# Patient Record
Sex: Female | Born: 1935
Health system: Southern US, Community
[De-identification: ages and names within clinical notes are randomized; demographics above are authoritative.]

## PROBLEM LIST (undated history)

## (undated) DIAGNOSIS — K589 Irritable bowel syndrome without diarrhea: Secondary | ICD-10-CM

## (undated) DIAGNOSIS — D126 Benign neoplasm of colon, unspecified: Secondary | ICD-10-CM

## (undated) DIAGNOSIS — F32A Depression, unspecified: Secondary | ICD-10-CM

## (undated) DIAGNOSIS — K219 Gastro-esophageal reflux disease without esophagitis: Secondary | ICD-10-CM

## (undated) DIAGNOSIS — D649 Anemia, unspecified: Secondary | ICD-10-CM

## (undated) DIAGNOSIS — F419 Anxiety disorder, unspecified: Secondary | ICD-10-CM

## (undated) HISTORY — DX: Depression, unspecified: F32.A

## (undated) HISTORY — PX: EYE SURGERY: SHX253

## (undated) HISTORY — DX: Irritable bowel syndrome, unspecified: K58.9

## (undated) HISTORY — DX: Gastro-esophageal reflux disease without esophagitis: K21.9

## (undated) HISTORY — DX: Anxiety disorder, unspecified: F41.9

## (undated) HISTORY — PX: CATARACT EXTRACTION: SUR2

## (undated) HISTORY — DX: Benign neoplasm of colon, unspecified: D12.6

---

## 2001-05-27 ENCOUNTER — Other Ambulatory Visit: Admission: RE | Admit: 2001-05-27 | Discharge: 2001-05-27 | Payer: Self-pay | Admitting: Family Medicine

## 2001-06-11 ENCOUNTER — Ambulatory Visit (HOSPITAL_COMMUNITY): Admission: RE | Admit: 2001-06-11 | Discharge: 2001-06-11 | Payer: Self-pay | Admitting: Family Medicine

## 2001-06-11 ENCOUNTER — Encounter: Payer: Self-pay | Admitting: Family Medicine

## 2002-06-17 ENCOUNTER — Encounter: Payer: Self-pay | Admitting: Family Medicine

## 2002-06-17 ENCOUNTER — Ambulatory Visit (HOSPITAL_COMMUNITY): Admission: RE | Admit: 2002-06-17 | Discharge: 2002-06-17 | Payer: Self-pay | Admitting: Family Medicine

## 2002-07-30 ENCOUNTER — Other Ambulatory Visit: Admission: RE | Admit: 2002-07-30 | Discharge: 2002-07-30 | Payer: Self-pay | Admitting: Obstetrics & Gynecology

## 2002-12-16 ENCOUNTER — Other Ambulatory Visit: Admission: RE | Admit: 2002-12-16 | Discharge: 2002-12-16 | Payer: Self-pay | Admitting: Obstetrics & Gynecology

## 2003-07-06 ENCOUNTER — Ambulatory Visit (HOSPITAL_COMMUNITY): Admission: RE | Admit: 2003-07-06 | Discharge: 2003-07-06 | Payer: Self-pay | Admitting: Family Medicine

## 2003-08-25 ENCOUNTER — Ambulatory Visit (HOSPITAL_COMMUNITY): Admission: RE | Admit: 2003-08-25 | Discharge: 2003-08-25 | Payer: Self-pay | Admitting: Internal Medicine

## 2003-10-05 ENCOUNTER — Ambulatory Visit (HOSPITAL_COMMUNITY): Admission: RE | Admit: 2003-10-05 | Discharge: 2003-10-05 | Payer: Self-pay | Admitting: Family Medicine

## 2003-12-29 ENCOUNTER — Ambulatory Visit (HOSPITAL_COMMUNITY): Admission: RE | Admit: 2003-12-29 | Discharge: 2003-12-29 | Payer: Self-pay | Admitting: Obstetrics & Gynecology

## 2003-12-29 ENCOUNTER — Encounter (INDEPENDENT_AMBULATORY_CARE_PROVIDER_SITE_OTHER): Payer: Self-pay | Admitting: Specialist

## 2004-08-04 ENCOUNTER — Ambulatory Visit (HOSPITAL_COMMUNITY): Admission: RE | Admit: 2004-08-04 | Discharge: 2004-08-04 | Payer: Self-pay | Admitting: Obstetrics & Gynecology

## 2004-08-16 ENCOUNTER — Encounter: Admission: RE | Admit: 2004-08-16 | Discharge: 2004-08-16 | Payer: Self-pay | Admitting: Obstetrics & Gynecology

## 2005-02-06 ENCOUNTER — Other Ambulatory Visit: Admission: RE | Admit: 2005-02-06 | Discharge: 2005-02-06 | Payer: Self-pay | Admitting: Obstetrics & Gynecology

## 2005-02-28 ENCOUNTER — Encounter: Admission: RE | Admit: 2005-02-28 | Discharge: 2005-02-28 | Payer: Self-pay | Admitting: Obstetrics & Gynecology

## 2005-08-07 ENCOUNTER — Encounter: Admission: RE | Admit: 2005-08-07 | Discharge: 2005-08-07 | Payer: Self-pay | Admitting: Obstetrics & Gynecology

## 2006-06-07 ENCOUNTER — Ambulatory Visit (HOSPITAL_COMMUNITY): Admission: RE | Admit: 2006-06-07 | Discharge: 2006-06-07 | Payer: Self-pay | Admitting: Family Medicine

## 2006-08-10 ENCOUNTER — Encounter: Admission: RE | Admit: 2006-08-10 | Discharge: 2006-08-10 | Payer: Self-pay | Admitting: Obstetrics & Gynecology

## 2007-08-23 ENCOUNTER — Encounter: Admission: RE | Admit: 2007-08-23 | Discharge: 2007-08-23 | Payer: Self-pay | Admitting: Obstetrics & Gynecology

## 2008-08-24 ENCOUNTER — Encounter: Admission: RE | Admit: 2008-08-24 | Discharge: 2008-08-24 | Payer: Self-pay | Admitting: Obstetrics & Gynecology

## 2008-11-11 ENCOUNTER — Ambulatory Visit (HOSPITAL_COMMUNITY): Admission: RE | Admit: 2008-11-11 | Discharge: 2008-11-11 | Payer: Self-pay | Admitting: Family Medicine

## 2009-08-16 ENCOUNTER — Encounter: Admission: RE | Admit: 2009-08-16 | Discharge: 2009-08-16 | Payer: Self-pay | Admitting: Obstetrics & Gynecology

## 2009-12-23 ENCOUNTER — Ambulatory Visit: Payer: Self-pay | Admitting: Otolaryngology

## 2010-01-27 ENCOUNTER — Ambulatory Visit: Payer: Self-pay | Admitting: Otolaryngology

## 2010-06-19 ENCOUNTER — Encounter: Payer: Self-pay | Admitting: Obstetrics & Gynecology

## 2010-06-27 ENCOUNTER — Other Ambulatory Visit (HOSPITAL_COMMUNITY): Payer: Self-pay | Admitting: Family Medicine

## 2010-07-28 ENCOUNTER — Ambulatory Visit (INDEPENDENT_AMBULATORY_CARE_PROVIDER_SITE_OTHER): Payer: Medicare Other | Admitting: Otolaryngology

## 2010-07-28 DIAGNOSIS — H60399 Other infective otitis externa, unspecified ear: Secondary | ICD-10-CM

## 2010-07-28 DIAGNOSIS — R220 Localized swelling, mass and lump, head: Secondary | ICD-10-CM

## 2010-08-03 ENCOUNTER — Other Ambulatory Visit: Payer: Self-pay | Admitting: Obstetrics & Gynecology

## 2010-08-03 DIAGNOSIS — Z1231 Encounter for screening mammogram for malignant neoplasm of breast: Secondary | ICD-10-CM

## 2010-08-25 ENCOUNTER — Ambulatory Visit
Admission: RE | Admit: 2010-08-25 | Discharge: 2010-08-25 | Disposition: A | Discharge status: Home / Self Care | Payer: Medicare Other | Source: Ambulatory Visit | Attending: Obstetrics & Gynecology | Admitting: Obstetrics & Gynecology

## 2010-08-25 DIAGNOSIS — Z1231 Encounter for screening mammogram for malignant neoplasm of breast: Secondary | ICD-10-CM

## 2010-09-12 ENCOUNTER — Encounter (HOSPITAL_COMMUNITY): Payer: Self-pay

## 2010-09-12 ENCOUNTER — Ambulatory Visit (HOSPITAL_COMMUNITY)
Admission: RE | Admit: 2010-09-12 | Discharge: 2010-09-12 | Disposition: A | Discharge status: Home / Self Care | Payer: Medicare Other | Source: Ambulatory Visit | Attending: Family Medicine | Admitting: Family Medicine

## 2010-09-12 ENCOUNTER — Other Ambulatory Visit (HOSPITAL_COMMUNITY): Payer: Self-pay

## 2010-09-12 DIAGNOSIS — M899 Disorder of bone, unspecified: Secondary | ICD-10-CM | POA: Insufficient documentation

## 2010-10-14 NOTE — Op Note (Signed)
NAME:  Stephanie Wood, Stephanie Wood                            ACCOUNT NO.:  [id number]   MEDICAL RECORD NO.:  [id number]                   PATIENT TYPE:  AMB   LOCATION:  SDC                                  FACILITY:  [facility]   PHYSICIAN:  Ilda Mori, M.D.                DATE OF BIRTH:  1935/04/14   DATE OF PROCEDURE:  12/29/2003  DATE OF DISCHARGE:                                 OPERATIVE REPORT   PREOPERATIVE DIAGNOSIS:  Postmenopausal bleeding.   POSTOPERATIVE DIAGNOSIS:  Postmenopausal bleeding, atrophic endometrium.   OPERATION PERFORMED:  Hysteroscopy, dilation and curettage.   SURGEON:  Ilda Mori, M.D.   ANESTHESIA:  General.   ESTIMATED BLOOD LOSS:  5 mL.   FINDINGS:  Atrophic and normal-appearing endometrial cavity.   INDICATIONS FOR PROCEDURE:  The patient is a 76 year old female who has had  an approximately one year history of postmenopausal bleeding.  She was first  seen because of an AGUS Pap smear report.  At that point, a P[place]elle biopsy  showed no tissue and the ECC was negative and colposcopy was negative.  The  patient was followed, had another episode of bleeding for which a  sonohysterogram was performed in the office.  The sonohysterogram was  technically difficult and a definitive view of the endometrial cavity could  not be obtained.  Since no abnormalities were seen at that time and follow-  up Pap smear came back normal, decision was made not to pursue the  diagnosis.  Subsequently, the patient developed another episode of  postmenopausal bleeding and in view of the poor quality sonohysterogram with  the possibility of mass or lesion having been missed, and the previous  history of AGUS Pap smear, the decision was made to proceed with hospital  hysteroscopy and dilation and curettage.   DESCRIPTION OF PROCEDURE:  The patient was taken to the operating room and  general anesthesia was induced.  She was then placed in dorsal lithotomy  position.  The perineum  and vagina were prepped and draped in sterile  fashion.  The cervix was grasped with single toothed tenaculum. The  endometrial cavity was sounded to 7 cm.  An ECC was performed and no tissue  was found.  The hysteroscope was introduced and the cavity was perfectly  symmetrical.  Ostia were seen bilaterally. There were no lesions in the  endometrium and the endometrium  appeared to be markedly atrophic.  The hysteroscope was removed.  A dilation  and curettage was performed and absolutely no significant amount of tissue  was obtained at [facility], which was consistent with the hysteroscopic findings.  The procedure was then terminated and the patient left the operating room in  good condition.  Ilda Mori, M.D.    RK/MEDQ  D:  12/29/2003  T:  12/29/2003  Job:  [id number]   cc:   Mila Homer. Sudie Bailey, M.D.  [address] [place], [place] [address]  Fax: [phone]

## 2010-10-14 NOTE — Op Note (Signed)
NAME:  Stephanie Wood, Stephanie Wood                            ACCOUNT NO.:  [id number]   MEDICAL RECORD NO.:  [id number]                   PATIENT TYPE:  AMB   LOCATION:  DAY                                  FACILITY:  [facility]   PHYSICIAN:  Lionel December, M.D.                 DATE OF BIRTH:  Sep 03, 1934   DATE OF PROCEDURE:  08/25/2003  DATE OF DISCHARGE:                                 OPERATIVE REPORT   PROCEDURE:  Total colonoscopy.   INDICATIONS FOR PROCEDURE:  Ms. Bresnan is a 76 year old Caucasian female who  is here for screening colonoscopy.  Family history is negative for  colorectal carcinoma.  The procedure and risks were reviewed with the  patient, and informed consent was obtained.   PREOPERATIVE MEDICATIONS:  Demerol 50 mg IV, Versed 5 mg IV in divided dose.   FINDINGS:  The procedure was performed in the endoscopy suite.  The  patient's vital signs and O2 saturations were monitored during the procedure  and remained stable.  The patient was placed in the left lateral recumbent  position and rectal examination performed.  No abnormality noted on external  or digital exam.  The Olympus videoscope was placed into the rectum and  advanced into the region of the sigmoid colon and beyond.  The preparation  was excellent.  The scope was passed to the cecum which was identified by  the appendiceal orifice and ileocecal valve.  Pictures were taken for the  record.  There was a tiny polyp to the left of the appendiceal orifice which  was ablated by cold biopsy.  The mucosa of the rest of the colon was normal.  The rectal mucosa similarly was normal.  The scope was retroflexed to  examine the anorectal junction which was unremarkable.  The endoscope was  straightened and withdrawn.  The patient tolerated the procedure well.   FINAL DIAGNOSES:  1. Small polyp at cecum which was ablated by cold biopsy.  2. The rest of the exam was normal.   RECOMMENDATIONS:  1. Standard instructions were given.  2. I will be contacting the patient with the biopsy results and further     recommendations if any.      ___________________________________________                                            Lionel December, M.D.   NR/MEDQ  D:  08/25/2003  T:  08/25/2003  Job:  [id number]   cc:   Mila Homer. Sudie Bailey, M.D.  [address] [place], [place] [address]  Fax: [phone]

## 2010-11-17 ENCOUNTER — Ambulatory Visit (INDEPENDENT_AMBULATORY_CARE_PROVIDER_SITE_OTHER): Payer: Medicare Other | Admitting: Otolaryngology

## 2010-11-17 DIAGNOSIS — J0100 Acute maxillary sinusitis, unspecified: Secondary | ICD-10-CM

## 2010-11-17 DIAGNOSIS — J343 Hypertrophy of nasal turbinates: Secondary | ICD-10-CM

## 2011-08-23 ENCOUNTER — Telehealth (INDEPENDENT_AMBULATORY_CARE_PROVIDER_SITE_OTHER): Payer: Self-pay | Admitting: *Deleted

## 2011-08-23 NOTE — Telephone Encounter (Signed)
Requesting MD: Sudie Bailey   PCP:  Sudie Bailey     Name & DOB: Kimblery Dekay  07/03/1934    Procedure: egd  Reason/Indication:  Iron def anemia  Has patient had this procedure before?  no  If so, when, by whom and where?    Is there a family history of colon cancer?    Who?  What age when diagnosed?   Is patient diabetic?   no      Does patient have prosthetic heart valve?  no  Do you have a pacemaker?  no  Has patient had joint replacement within last 12 months?  no  Is patient on Coumadin, Plavix and/or Aspirin? yes  Medications: asa 81 mg daily, ferrous sulfate 325 mg bid, evista 60 mg daily, alendronate 70 mg 1 tab weekly, alprazolam 0.5 mg - 1 tab tid, vit b12 1000 mcg daily, cosamine ds 400/500 mg daily, atorvastatin 80 mg  tab daily, vit d3 400 iu daily   Allergies: nkda  Pharmacy:   Medication Adjustment: asa 2 days  Procedure date & time: 09/20/11 at 215

## 2011-08-28 NOTE — Telephone Encounter (Signed)
Agree 

## 2011-08-30 ENCOUNTER — Other Ambulatory Visit (INDEPENDENT_AMBULATORY_CARE_PROVIDER_SITE_OTHER): Payer: Self-pay | Admitting: *Deleted

## 2011-08-30 DIAGNOSIS — D509 Iron deficiency anemia, unspecified: Secondary | ICD-10-CM

## 2011-09-05 ENCOUNTER — Other Ambulatory Visit: Payer: Self-pay | Admitting: Obstetrics & Gynecology

## 2011-09-05 DIAGNOSIS — Z1231 Encounter for screening mammogram for malignant neoplasm of breast: Secondary | ICD-10-CM

## 2011-09-18 ENCOUNTER — Encounter (HOSPITAL_COMMUNITY): Payer: Self-pay | Admitting: Pharmacy Technician

## 2011-09-19 MED ORDER — SODIUM CHLORIDE 0.45 % IV SOLN
Freq: Once | INTRAVENOUS | Status: AC
  Administered 2011-09-20: 13:00:00 via INTRAVENOUS

## 2011-09-20 ENCOUNTER — Encounter (HOSPITAL_COMMUNITY): Payer: Self-pay | Admitting: *Deleted

## 2011-09-20 ENCOUNTER — Encounter (HOSPITAL_COMMUNITY)
Admission: RE | Disposition: A | Discharge status: Home / Self Care | Payer: Self-pay | Source: Ambulatory Visit | Attending: Internal Medicine

## 2011-09-20 ENCOUNTER — Ambulatory Visit (HOSPITAL_COMMUNITY)
Admission: RE | Admit: 2011-09-20 | Discharge: 2011-09-20 | Disposition: A | Discharge status: Home / Self Care | Payer: Medicare Other | Source: Ambulatory Visit | Attending: Internal Medicine | Admitting: Internal Medicine

## 2011-09-20 DIAGNOSIS — K449 Diaphragmatic hernia without obstruction or gangrene: Secondary | ICD-10-CM

## 2011-09-20 DIAGNOSIS — D509 Iron deficiency anemia, unspecified: Secondary | ICD-10-CM

## 2011-09-20 DIAGNOSIS — A048 Other specified bacterial intestinal infections: Secondary | ICD-10-CM | POA: Insufficient documentation

## 2011-09-20 DIAGNOSIS — K219 Gastro-esophageal reflux disease without esophagitis: Secondary | ICD-10-CM

## 2011-09-20 DIAGNOSIS — K228 Other specified diseases of esophagus: Secondary | ICD-10-CM

## 2011-09-20 DIAGNOSIS — K2940 Chronic atrophic gastritis without bleeding: Secondary | ICD-10-CM | POA: Insufficient documentation

## 2011-09-20 DIAGNOSIS — K2100 Gastro-esophageal reflux disease with esophagitis, without bleeding: Secondary | ICD-10-CM | POA: Insufficient documentation

## 2011-09-20 HISTORY — PX: ESOPHAGOGASTRODUODENOSCOPY: SHX5428

## 2011-09-20 SURGERY — EGD (ESOPHAGOGASTRODUODENOSCOPY)
Anesthesia: Moderate Sedation

## 2011-09-20 MED ORDER — STERILE WATER FOR IRRIGATION IR SOLN
Status: DC | PRN
  Administered 2011-09-20: 15:00:00

## 2011-09-20 MED ORDER — MIDAZOLAM HCL 5 MG/5ML IJ SOLN
INTRAMUSCULAR | Status: AC
  Filled 2011-09-20: qty 10

## 2011-09-20 MED ORDER — MEPERIDINE HCL 50 MG/ML IJ SOLN
INTRAMUSCULAR | Status: AC
  Filled 2011-09-20: qty 1

## 2011-09-20 MED ORDER — BUTAMBEN-TETRACAINE-BENZOCAINE 2-2-14 % EX AERO
INHALATION_SPRAY | CUTANEOUS | Status: DC | PRN
  Administered 2011-09-20: 2 via TOPICAL

## 2011-09-20 MED ORDER — MEPERIDINE HCL 25 MG/ML IJ SOLN
INTRAMUSCULAR | Status: DC | PRN
  Administered 2011-09-20 (×2): 25 mg via INTRAVENOUS

## 2011-09-20 MED ORDER — PANTOPRAZOLE SODIUM 40 MG PO TBEC: 40.0000 mg | DELAYED_RELEASE_TABLET | Freq: Every day | ORAL | Status: DC

## 2011-09-20 MED ORDER — MIDAZOLAM HCL 5 MG/5ML IJ SOLN
INTRAMUSCULAR | Status: DC | PRN
  Administered 2011-09-20 (×2): 2 mg via INTRAVENOUS

## 2011-09-20 NOTE — H&P (Signed)
Stephanie Wood is an 77 y.o. female.   Chief Complaint: Patient is here for EGD. HPI: Patient is a 77 year old Caucasian female who was recently found to have iron deficiency anemia by her primary care physician Dr. Alyson Locket. Her stool was guaiac-negative. Patient's last colonoscopy was 2-1/2 years ago by me had [facility] with removal of some few polyps. She does not have any symptoms other than occasional dysphagia. She denies heartburn epigastric pain melena or rectal bleeding. Patient does take low-dose aspirin and has been chronically on Fosamax.  History reviewed. No pertinent past medical history.  Past Surgical History  Procedure Date  . Eye surgery     bil catatract surgery    History reviewed. No pertinent family history. Social History:  does not have a smoking history on file. She does not have any smokeless tobacco history on file. Her alcohol and drug histories not on file.  Allergies: No Known Allergies  Medications Prior to Admission  Medication Sig Dispense Refill  . alendronate (FOSAMAX) 70 MG tablet Take 70 mg by mouth every 7 (seven) days. Take with a full glass of water on an empty stomach. Takes on Wednesday      . ALPRAZolam (XANAX) 0.5 MG tablet Take 0.5 mg by mouth at bedtime as needed. Sleep      . aspirin EC 81 MG tablet Take 81 mg by mouth daily.      Marland Kitchen atorvastatin (LIPITOR) 20 MG tablet Take 20 mg by mouth daily.      . Calcium Carb-Cholecalciferol (CALCIUM 500 +D) 500-400 MG-UNIT TABS Take 3 tablets by mouth daily.      . cholecalciferol (VITAMIN D) 400 UNITS TABS Take 400 Units by mouth 3 (three) times daily.      . ferrous sulfate 325 (65 FE) MG tablet Take 325 mg by mouth 2 (two) times daily.      . Glucosamine-Chondroitin (COSAMIN DS) 500-400 MG CAPS Take 1 capsule by mouth daily.      . raloxifene (EVISTA) 60 MG tablet Take 60 mg by mouth daily.      . vitamin B-12 (CYANOCOBALAMIN) 1000 MCG tablet Take 1,000 mcg by mouth daily.        No results found for  this or any previous visit (from the past 48 hour(s)). No results found.  Review of Systems  Constitutional: Negative for weight loss.  Gastrointestinal: Negative for abdominal pain, diarrhea, constipation, blood in stool and melena.    Blood pressure 149/79, pulse 94, temperature 98.1 F (36.7 C), temperature source Oral, resp. rate 20, height 5\' 4"  (1.626 m), weight 170 lb (77.111 kg), SpO2 95.00%. Physical Exam  Constitutional: She appears well-developed and well-nourished.  HENT:  Mouth/Throat: Oropharynx is clear and moist.  Eyes: Conjunctivae are normal. No scleral icterus.  Neck: No thyromegaly present.  Cardiovascular: Normal rate, regular rhythm and normal heart sounds.   No murmur heard. Respiratory: Effort normal and breath sounds normal.  GI: Soft. She exhibits no distension and no mass. There is no tenderness.  Musculoskeletal: She exhibits no edema.  Lymphadenopathy:    She has no cervical adenopathy.  Neurological: She is alert.  Skin: Skin is warm and dry.     Assessment/Plan Iron deficiency anemia. Last colonoscopy 2-1/2 years ago as above. Diagnostic EGD  Odessa Morren U 09/20/2011, 3:09 PM

## 2011-09-20 NOTE — Op Note (Signed)
EGD PROCEDURE REPORT  PATIENT:  Stephanie Wood  MR#:  [id number] Birthdate:  [birth year], 77 y.o., female Endoscopist:  Dr. Malissa Hippo, MD Referred By:  Dr. Saul Fordyce. Sudie Bailey, MD Procedure Date: 09/20/2011  Procedure:   EGD  Indications:  Patient is a 77 year old Caucasian female who was recently found to have iron deficiency anemia by Dr. Sudie Bailey. Patient's last colonoscopy was 2-1/2 years ago. Patient is on low-dose aspirin and Fosamax and therefore undergoing diagnostic EGD.            Informed Consent:  The risks, benefits, alternatives & imponderables which include, but are not limited to, bleeding, infection, perforation, drug reaction and potential missed lesion have been reviewed.  The potential for biopsy, lesion removal, esophageal dilation, etc. have also been discussed.  Questions have been answered.  All parties agreeable.  Please see history & physical in medical record for more information.  Medications:  Demerol 50 mg IV Versed 4 mg IV Cetacaine spray topically for oropharyngeal anesthesia  Description of procedure:  The endoscope was introduced through the mouth and advanced to the second portion of the duodenum without difficulty or limitations. The mucosal surfaces were surveyed very carefully during advancement of the scope and upon withdrawal.  Findings:  Esophagus:  Mucosa of the esophagus was normal. Edema noted to mucosa at the GE junction. GEJ:  30 cm Hiatus:  36 cm Stomach:  Stomach was empty and distended very well with insufflation. Folds in the proximal stomach are normal. Examination of mucosa revealed single linear erosion just below the level of hiatus. There was scarring to mucosa at angularis but no ulcer crater noted. Pyloric channel was patent. Angularis was unremarkable. Hernia was examined on retroflex view. There was cobblestone appearance to mucosa of herniated part of the stomach along with focal erythema. Duodenum:  Mild bulbar and post bulbar  mucosa.  Therapeutic/Diagnostic Maneuvers Performed:  Biopsy taken from abdomen obtaining gastric mucosa at hiatal hernia  Complications:  None  Impression: Mild changes of reflux esophagitis limited to GE junction. Moderate size sliding hiatal hernia with abnormal appearance to mucosa and focal gastritis. Single erosion below the level of hiatus and antral scar. Normal bulbar and post bulbar mucosa.  Recommendations:  Anti-reflux measures. Pantoprazole 40 mg by mouth every morning. I will contact patient with results of biopsy and further recommendations  [name]  09/20/2011  3:37 PM  CC: Dr. Milana Obey, MD, MD & Dr. Bonnetta Barry ref. provider found

## 2011-09-20 NOTE — Discharge Instructions (Signed)
Resume usual medications and diet. Anti-reflux measures. Pantoprazole 40 mg by mouth every morning. Physician will contact you results of biopsy and further recommendations.  Endoscopy Care After Please read the instructions outlined below and refer to this sheet in the next few weeks. These discharge instructions provide you with general information on caring for yourself after you leave the hospital. Your doctor may also give you specific instructions. While your treatment has been planned according to the most current medical practices available, unavoidable complications occasionally occur. If you have any problems or questions after discharge, please call your doctor. HOME CARE INSTRUCTIONS Activity  You may resume your regular activity but move at a slower pace for the next 24 hours.   Take frequent rest periods for the next 24 hours.   Walking will help expel (get rid of) the air and reduce the bloated feeling in your abdomen.   No driving for 24 hours (because of the anesthesia (medicine) used during the test).   You may shower.   Do not sign any important legal documents or operate any machinery for 24 hours (because of the anesthesia used during the test).  Nutrition  Drink plenty of fluids.   You may resume your normal diet.   Begin with a light meal and progress to your normal diet.   Avoid alcoholic beverages for 24 hours or as instructed by your caregiver.  Medications You may resume your normal medications unless your caregiver tells you otherwise. What you can expect today  You may experience abdominal discomfort such as a feeling of fullness or "gas" pains.   You may experience a sore throat for 2 to 3 days. This is normal. Gargling with salt water may help this.  Follow-up Your doctor will discuss the results of your test with you. SEEK IMMEDIATE MEDICAL CARE IF:  You have excessive nausea (feeling sick to your stomach) and/or vomiting.   You have severe  abdominal pain and distention (swelling).   You have trouble swallowing.   You have a temperature over 100 F (37.8 C).   You have rectal bleeding or vomiting of blood.  Document Released: 12/28/2003 Document Revised: 05/04/2011 Document Reviewed: 07/10/2007 [facility] Patient Information 2012 [place], [place].  Gastroesophageal Reflux Disease, Adult Gastroesophageal reflux disease (GERD) happens when acid from your stomach goes into your food pipe (esophagus). The acid can cause a burning feeling in your chest. Over time, the acid can make small holes (ulcers) in your food pipe.  HOME CARE  Ask your doctor for advice about:   Losing weight.   Quitting smoking.   Alcohol use.   Avoid foods and drinks that make your problems worse. You may want to avoid:   Caffeine and alcohol.   Chocolate.   Mints.   Garlic and onions.   Spicy foods.   Citrus fruits, such as oranges, lemons, or limes.   Foods that contain tomato, such as sauce, chili, salsa, and pizza.   Fried and fatty foods.   Avoid lying down for 3 hours before you go to bed or before you take a nap.   Eat small meals often, instead of large meals.   Wear loose-fitting clothing. Do not wear anything tight around your waist.   Raise (elevate) the head of your bed 6 to 8 inches with wood blocks. Using extra pillows does not help.   Only take medicines as told by your doctor.   Do not take aspirin or ibuprofen.  GET HELP RIGHT AWAY IF:  You have pain in your arms, neck, jaw, teeth, or back.   Your pain gets worse or changes.   You feel sick to your stomach (nauseous), throw up (vomit), or sweat (diaphoresis).   You feel short of breath, or you pass out (faint).   Your throw up is green, yellow, black, or looks like coffee grounds or blood.   Your poop (stool) is red, bloody, or black.  MAKE SURE YOU:   Understand these instructions.   Will watch your condition.   Will get help right away if you are  not doing well or get worse.  Document Released: 11/01/2007 Document Revised: 05/04/2011 Document Reviewed: 12/02/2010 [facility] Patient Information 2012 [place], [place].

## 2011-09-25 ENCOUNTER — Encounter (HOSPITAL_COMMUNITY): Payer: Self-pay | Admitting: Internal Medicine

## 2011-09-27 ENCOUNTER — Other Ambulatory Visit (INDEPENDENT_AMBULATORY_CARE_PROVIDER_SITE_OTHER): Payer: Self-pay | Admitting: Internal Medicine

## 2011-09-27 DIAGNOSIS — A048 Other specified bacterial intestinal infections: Secondary | ICD-10-CM

## 2011-09-27 MED ORDER — BIS SUBCIT-METRONID-TETRACYC 140-125-125 MG PO CAPS: 3.0000 | ORAL_CAPSULE | Freq: Three times a day (TID) | ORAL | Status: DC

## 2011-09-28 ENCOUNTER — Telehealth (INDEPENDENT_AMBULATORY_CARE_PROVIDER_SITE_OTHER): Payer: Self-pay | Admitting: *Deleted

## 2011-09-28 ENCOUNTER — Encounter (INDEPENDENT_AMBULATORY_CARE_PROVIDER_SITE_OTHER): Payer: Self-pay | Admitting: *Deleted

## 2011-09-28 DIAGNOSIS — D509 Iron deficiency anemia, unspecified: Secondary | ICD-10-CM

## 2011-09-28 NOTE — Telephone Encounter (Signed)
Per NUR the patient will need H/H in 4 weeks this is due 10/26/11.

## 2011-09-29 ENCOUNTER — Encounter (INDEPENDENT_AMBULATORY_CARE_PROVIDER_SITE_OTHER): Payer: Self-pay | Admitting: *Deleted

## 2011-10-02 ENCOUNTER — Telehealth (INDEPENDENT_AMBULATORY_CARE_PROVIDER_SITE_OTHER): Payer: Self-pay | Admitting: Internal Medicine

## 2011-10-02 ENCOUNTER — Other Ambulatory Visit (INDEPENDENT_AMBULATORY_CARE_PROVIDER_SITE_OTHER): Payer: Self-pay | Admitting: *Deleted

## 2011-10-02 DIAGNOSIS — Z1231 Encounter for screening mammogram for malignant neoplasm of breast: Secondary | ICD-10-CM

## 2011-10-02 NOTE — Telephone Encounter (Signed)
The Pylera was to expensive for her to get with her insurance. I am given her 5 vouchers of Pylera (for full treatment) of her H. Pylori.  She says she is not allergic to anything. She will come by office to pick vouchers up.

## 2011-10-03 ENCOUNTER — Telehealth (INDEPENDENT_AMBULATORY_CARE_PROVIDER_SITE_OTHER): Payer: Self-pay | Admitting: *Deleted

## 2011-10-03 NOTE — Telephone Encounter (Signed)
Rec'd a PA from [facility] for Pylera. [employer] called and per [place] after getting information she was going to submit it to the Clinical Pharmacy Board to review and we should have a results in 24 hours. Patient and Pharmacy called and made aware. Terri Setzer,NP to be made aware as I will be out of the office 05/08 -05/13

## 2011-10-10 ENCOUNTER — Ambulatory Visit
Admission: RE | Admit: 2011-10-10 | Discharge: 2011-10-10 | Disposition: A | Discharge status: Home / Self Care | Payer: Medicare Other | Source: Ambulatory Visit | Attending: Obstetrics & Gynecology | Admitting: Obstetrics & Gynecology

## 2011-10-26 LAB — HEMOGLOBIN AND HEMATOCRIT, BLOOD
HCT: 38.9 % (ref 36.0–46.0)
Hemoglobin: 12.5 g/dL (ref 12.0–15.0)

## 2011-10-31 ENCOUNTER — Telehealth (INDEPENDENT_AMBULATORY_CARE_PROVIDER_SITE_OTHER): Payer: Self-pay | Admitting: *Deleted

## 2011-10-31 DIAGNOSIS — D509 Iron deficiency anemia, unspecified: Secondary | ICD-10-CM

## 2011-10-31 NOTE — Telephone Encounter (Signed)
Per Dr.Rehman the patient will need to have H/H in 4 weeks. 

## 2011-11-01 ENCOUNTER — Encounter (INDEPENDENT_AMBULATORY_CARE_PROVIDER_SITE_OTHER): Payer: Self-pay

## 2011-11-23 ENCOUNTER — Encounter (INDEPENDENT_AMBULATORY_CARE_PROVIDER_SITE_OTHER): Payer: Self-pay | Admitting: *Deleted

## 2011-11-23 ENCOUNTER — Other Ambulatory Visit (INDEPENDENT_AMBULATORY_CARE_PROVIDER_SITE_OTHER): Payer: Self-pay | Admitting: *Deleted

## 2011-11-23 DIAGNOSIS — D509 Iron deficiency anemia, unspecified: Secondary | ICD-10-CM

## 2011-12-04 ENCOUNTER — Telehealth (INDEPENDENT_AMBULATORY_CARE_PROVIDER_SITE_OTHER): Payer: Self-pay | Admitting: *Deleted

## 2011-12-04 NOTE — Telephone Encounter (Signed)
Patient was called 2 days ago and message left on her answering service and I have gone to see him again. Lupita Leash, if patient calls again you can give her the result.

## 2011-12-04 NOTE — Telephone Encounter (Signed)
Patient is going out of town and would like to get the results of her labs before she leaves. Would like to know if Dr. Karilyn Cota would please give her a return call at [phone].

## 2012-02-12 ENCOUNTER — Other Ambulatory Visit (HOSPITAL_COMMUNITY): Payer: Self-pay | Admitting: Family Medicine

## 2012-02-12 DIAGNOSIS — I639 Cerebral infarction, unspecified: Secondary | ICD-10-CM

## 2012-02-19 ENCOUNTER — Ambulatory Visit (HOSPITAL_COMMUNITY)
Admission: RE | Admit: 2012-02-19 | Discharge: 2012-02-19 | Disposition: A | Discharge status: Home / Self Care | Payer: Medicare Other | Source: Ambulatory Visit | Attending: Family Medicine | Admitting: Family Medicine

## 2012-02-19 DIAGNOSIS — I639 Cerebral infarction, unspecified: Secondary | ICD-10-CM

## 2012-02-19 DIAGNOSIS — R1031 Right lower quadrant pain: Secondary | ICD-10-CM | POA: Insufficient documentation

## 2012-09-19 ENCOUNTER — Other Ambulatory Visit: Payer: Self-pay

## 2012-09-19 DIAGNOSIS — Z1231 Encounter for screening mammogram for malignant neoplasm of breast: Secondary | ICD-10-CM

## 2012-10-10 ENCOUNTER — Ambulatory Visit
Admission: RE | Admit: 2012-10-10 | Discharge: 2012-10-10 | Disposition: A | Discharge status: Home / Self Care | Payer: Medicare Other | Source: Ambulatory Visit

## 2012-10-10 DIAGNOSIS — Z1231 Encounter for screening mammogram for malignant neoplasm of breast: Secondary | ICD-10-CM

## 2012-10-28 ENCOUNTER — Other Ambulatory Visit (HOSPITAL_COMMUNITY): Payer: Self-pay | Admitting: Family Medicine

## 2012-10-28 DIAGNOSIS — M8580 Other specified disorders of bone density and structure, unspecified site: Secondary | ICD-10-CM

## 2012-10-31 ENCOUNTER — Other Ambulatory Visit (HOSPITAL_COMMUNITY): Payer: Medicare Other

## 2012-11-07 ENCOUNTER — Ambulatory Visit (HOSPITAL_COMMUNITY)
Admission: RE | Admit: 2012-11-07 | Discharge: 2012-11-07 | Disposition: A | Discharge status: Home / Self Care | Payer: Medicare Other | Source: Ambulatory Visit | Attending: Family Medicine | Admitting: Family Medicine

## 2012-11-07 DIAGNOSIS — M8580 Other specified disorders of bone density and structure, unspecified site: Secondary | ICD-10-CM

## 2012-11-07 DIAGNOSIS — M949 Disorder of cartilage, unspecified: Secondary | ICD-10-CM | POA: Insufficient documentation

## 2012-11-07 DIAGNOSIS — M899 Disorder of bone, unspecified: Secondary | ICD-10-CM | POA: Insufficient documentation

## 2013-07-28 ENCOUNTER — Ambulatory Visit: Payer: Medicare Other

## 2013-07-28 ENCOUNTER — Ambulatory Visit (INDEPENDENT_AMBULATORY_CARE_PROVIDER_SITE_OTHER): Payer: Medicare Other | Admitting: Internal Medicine

## 2013-07-28 VITALS — BP 132/76 | HR 112 | Temp 100.0°F | Resp 20 | Ht 62.5 in | Wt 176.0 lb

## 2013-07-28 DIAGNOSIS — R05 Cough: Secondary | ICD-10-CM

## 2013-07-28 DIAGNOSIS — R059 Cough, unspecified: Secondary | ICD-10-CM

## 2013-07-28 DIAGNOSIS — J019 Acute sinusitis, unspecified: Secondary | ICD-10-CM

## 2013-07-28 DIAGNOSIS — R509 Fever, unspecified: Secondary | ICD-10-CM

## 2013-07-28 DIAGNOSIS — R9389 Abnormal findings on diagnostic imaging of other specified body structures: Secondary | ICD-10-CM

## 2013-07-28 LAB — POCT CBC
GRANULOCYTE PERCENT: 74.7 % (ref 37–80)
HEMATOCRIT: 40.2 % (ref 37.7–47.9)
Hemoglobin: 12.2 g/dL (ref 12.2–16.2)
Lymph, poc: 1.6 (ref 0.6–3.4)
MCH: 28.6 pg (ref 27–31.2)
MCHC: 30.3 g/dL — AB (ref 31.8–35.4)
MCV: 94.2 fL (ref 80–97)
MID (CBC): 0.4 (ref 0–0.9)
MPV: 9.7 fL (ref 0–99.8)
PLATELET COUNT, POC: 153 10*3/uL (ref 142–424)
POC Granulocyte: 5.8 (ref 2–6.9)
POC LYMPH %: 20.1 % (ref 10–50)
POC MID %: 5.2 %M (ref 0–12)
RBC: 4.27 M/uL (ref 4.04–5.48)
RDW, POC: 14.7 %
WBC: 7.8 10*3/uL (ref 4.6–10.2)

## 2013-07-28 MED ORDER — BENZONATATE 100 MG PO CAPS: 100.0000 mg | ORAL_CAPSULE | Freq: Two times a day (BID) | ORAL | Status: DC | PRN

## 2013-07-28 MED ORDER — AMOXICILLIN 500 MG PO CAPS: 1000.0000 mg | ORAL_CAPSULE | Freq: Two times a day (BID) | ORAL | Status: AC

## 2013-07-28 NOTE — Progress Notes (Addendum)
This chart was scribed for Wardell Honour, MD by Einar Pheasant, ED Scribe. This patient was seen in room 3 and the patient's care was started at 6:59 PM. Subjective:    Patient ID: Stephanie Wood, female    DOB: [birth year], 79 y.o.   MRN: [id number]  Chief Complaint  Patient presents with   Sore Throat    loss of voice   Fever    low grade   Cough    HPI HPI Comments: Stephanie Wood is a 79 y.o. female who presents to [facility] complaining of intermittent low grade fever that started 5 days ago. Pt is also complaining of associated sore throat that led to her losing her voice, and a productive cough. She says that her fever broke on Sunday.-She felt well all day. However, upon waking today, she noticed that her fever had returned and that she has a bad cough that is beginning to be productive. Pt states that he was seen by Dr. Karie Kirks with similar symptoms in December and it took her weeks to get better. She denies being diagnosed with pneumonia. Pt denies any sick contacts.  There are no active problems to display for this patient.  medications indicated past history. No past medical history on file. Past Surgical History  Procedure Laterality Date   Eye surgery      bil catatract surgery   Esophagogastroduodenoscopy  09/20/2011    Procedure: ESOPHAGOGASTRODUODENOSCOPY (EGD);  Surgeon: Rogene Houston, MD;  [facility]: [facility];  Service: Endoscopy;  Laterality: N/A;  215   No Known Allergies Prior to Admission medications   Medication Sig Start Date End Date Taking? Authorizing Provider  alendronate (FOSAMAX) 70 MG tablet Take 70 mg by mouth every 7 (seven) days. Take with a full glass of water on an empty stomach. Takes on Wednesday   Yes Historical Provider, MD  ALPRAZolam Duanne Moron) 0.5 MG tablet Take 0.5 mg by mouth at bedtime as needed. Sleep   Yes Historical Provider, MD  aspirin EC 81 MG tablet Take 81 mg by mouth daily.   Yes Historical Provider, MD  atorvastatin (LIPITOR) 20  MG tablet Take 20 mg by mouth daily.   Yes Historical Provider, MD  Calcium Carb-Cholecalciferol (CALCIUM 500 +D) 500-400 MG-UNIT TABS Take 3 tablets by mouth daily.   Yes Historical Provider, MD  cholecalciferol (VITAMIN D) 400 UNITS TABS Take 400 Units by mouth 3 (three) times daily.   Yes Historical Provider, MD  ciprofloxacin (CIPRO) 500 MG tablet Take 500 mg by mouth 2 (two) times daily.   Yes Historical Provider, MD  ferrous sulfate 325 (65 FE) MG tablet Take 325 mg by mouth 2 (two) times daily.   Yes Historical Provider, MD  Glucosamine-Chondroitin (COSAMIN DS) 500-400 MG CAPS Take 1 capsule by mouth daily.   Yes Historical Provider, MD  raloxifene (EVISTA) 60 MG tablet Take 60 mg by mouth daily.   Yes Historical Provider, MD  vitamin B-12 (CYANOCOBALAMIN) 1000 MCG tablet Take 1,000 mcg by mouth daily.   Yes Historical Provider, MD  bismuth-metronidazole-tetracycline (PYLERA) [phone] MG per capsule Take 3 capsules by mouth 4 (four) times daily -  before meals and at bedtime. 09/27/11 10/11/11  Rogene Houston, MD  pantoprazole (PROTONIX) 40 MG tablet Take 1 tablet (40 mg total) by mouth daily. 09/20/11 09/19/12  Rogene Houston, MD      Review of Systems  Constitutional: Negative for activity change, appetite change, fatigue and unexpected weight change.  HENT: Positive  for sinus pressure and voice change. Negative for postnasal drip.   Eyes: Negative for visual disturbance.  Respiratory: Positive for cough. Negative for shortness of breath and wheezing.   Cardiovascular: Negative for chest pain, palpitations and leg swelling.       Objective:   Physical Exam  Nursing note and vitals reviewed. Constitutional: She appears well-developed and well-nourished. No distress.  HENT:  Head: Normocephalic and atraumatic.  Wood Ear: Tympanic membrane and external ear normal.  Left Ear: Tympanic membrane and external ear normal.  Mouth/Throat: Posterior oropharyngeal erythema present.    Nares are boggy with some purulent postnasal drainage  Eyes: Conjunctivae are normal. Wood eye exhibits no discharge. Left eye exhibits no discharge.  Neck: Neck supple. No thyromegaly present.  Cardiovascular: Normal rate, regular rhythm, normal heart sounds and intact distal pulses.  Exam reveals no gallop.   No murmur heard. Pulmonary/Chest: Effort normal and breath sounds normal. No respiratory distress.  Abdominal: Soft. She exhibits no distension. There is no tenderness.  Musculoskeletal: She exhibits no edema and no tenderness.  Lymphadenopathy:    She has no cervical adenopathy.  Neurological: She is alert.  Skin: Skin is warm and dry.  Psychiatric: She has a normal mood and affect. Her behavior is normal. Thought content normal.    Filed Vitals:   07/28/13 1802  BP: 132/76  Pulse: 112  Temp: 100 F (37.8 C)  Resp: 20  Height: 5' 2.5" (1.588 m)  Weight: 176 lb (79.833 kg)  SpO2: 98%    [facility] reading (PRIMARY) by  Dr. Doolittle=chronic changes/osreopenia-spine  X-rays to radiology for interpretation  Results for orders placed in visit on 07/28/13  POCT CBC      Result Value Ref Range   WBC 7.8  4.6 - 10.2 K/uL   Lymph, poc 1.6  0.6 - 3.4   POC LYMPH PERCENT 20.1  10 - 50 %L   MID (cbc) 0.4  0 - 0.9   POC MID % 5.2  0 - 12 %M   POC Granulocyte 5.8  2 - 6.9   Granulocyte percent 74.7  37 - 80 %G   RBC 4.27  4.04 - 5.48 M/uL   Hemoglobin 12.2  12.2 - 16.2 g/dL   HCT, POC 40.2  37.7 - 47.9 %   MCV 94.2  80 - 97 fL   MCH, POC 28.6  27 - 31.2 pg   MCHC 30.3 (*) 31.8 - 35.4 g/dL   RDW, POC 14.7     Platelet Count, POC 153  142 - 424 K/uL   MPV 9.7  0 - 99.8 fL       Assessment & Plan:  Cough - Plan: benzonatate (TESSALON) 100 MG capsule  Fever, unspecified -  Sinusitis, acute - Plan: amoxicillin (AMOXIL)  Meds ordered this encounter  Medications   amoxicillin (AMOXIL) 500 MG capsule    Sig: Take 2 capsules (1,000 mg total) by mouth 2 (two) times daily.     Dispense:  40 capsule    Refill:  0   benzonatate (TESSALON) 100 MG capsule    Sig: Take 1 capsule (100 mg total) by mouth 2 (two) times daily as needed for cough.    Dispense:  20 capsule    Refill:  0     I have completed the patient encounter in its entirety as documented by the scribe, with editing by me where necessary. Robert P. Laney Pastor, M.D.  07/29/13 rad FINDINGS:  Kyphosis of the thoracic spine. There  is a retrocardiac density with  lucency. Findings are most compatible with a hiatal hernia. Lung  markings are slightly coarse and likely represent chronic changes.  There is no significant airspace disease but there are increased  densities along the lateral left sixth rib. Findings could represent  overlying shadows but cannot exclude abnormality to this left rib.  Slightly increased densities overlying the mid Wood clavicle is  probably related to the bone. Heart size is within normal limits.  Degenerative changes in the thoracic spine.  IMPRESSION:  Increased densities involving the lateral left sixth rib as  described. Findings are nonspecific. Findings could be related to  parenchymal lung disease but cannot exclude abnormality in the left  rib. Recommend short-term follow-up versus further evaluation with  chest CT.  Hiatal hernia.    We will call in all for her followup x-rays here or with her physician Dr. Karie Kirks

## 2013-07-29 DIAGNOSIS — F411 Generalized anxiety disorder: Secondary | ICD-10-CM | POA: Insufficient documentation

## 2013-07-29 DIAGNOSIS — K219 Gastro-esophageal reflux disease without esophagitis: Secondary | ICD-10-CM | POA: Insufficient documentation

## 2013-07-29 DIAGNOSIS — M810 Age-related osteoporosis without current pathological fracture: Secondary | ICD-10-CM | POA: Insufficient documentation

## 2013-07-29 DIAGNOSIS — E785 Hyperlipidemia, unspecified: Secondary | ICD-10-CM | POA: Insufficient documentation

## 2013-08-23 ENCOUNTER — Ambulatory Visit: Payer: Medicare Other

## 2013-08-23 ENCOUNTER — Ambulatory Visit (INDEPENDENT_AMBULATORY_CARE_PROVIDER_SITE_OTHER): Payer: Medicare Other | Admitting: Internal Medicine

## 2013-08-23 VITALS — BP 132/80 | HR 83 | Temp 98.0°F | Resp 17 | Ht 62.5 in | Wt 174.0 lb

## 2013-08-23 DIAGNOSIS — N390 Urinary tract infection, site not specified: Secondary | ICD-10-CM

## 2013-08-23 DIAGNOSIS — R9389 Abnormal findings on diagnostic imaging of other specified body structures: Secondary | ICD-10-CM

## 2013-08-23 DIAGNOSIS — R918 Other nonspecific abnormal finding of lung field: Secondary | ICD-10-CM

## 2013-08-23 LAB — POCT URINALYSIS DIPSTICK
BILIRUBIN UA: NEGATIVE
Blood, UA: NEGATIVE
Glucose, UA: NEGATIVE
KETONES UA: NEGATIVE
LEUKOCYTES UA: NEGATIVE
NITRITE UA: NEGATIVE
Protein, UA: NEGATIVE
Spec Grav, UA: <=1.005
Urobilinogen, UA: 0.2
pH, UA: 5.5

## 2013-08-23 LAB — POCT UA - MICROSCOPIC ONLY
Casts, Ur, LPF, POC: NEGATIVE
Crystals, Ur, HPF, POC: NEGATIVE
Mucus, UA: NEGATIVE
YEAST UA: NEGATIVE

## 2013-08-23 NOTE — Progress Notes (Signed)
   Subjective:   This chart was scribed for Tami Lin, MD by Forrestine Him, [facility] Scribe. This patient was seen in room 4 and the patient's care was started 10:53 AM.    Patient ID: Stephanie Wood, female    DOB: 09/22/1934, 79 y.o.   MRN: [id number]  Chief Complaint  Patient presents with  . Follow-up    patient does not know why was called and told to come back in to see DR Laney Pastor      HPI  HPI Comments: Stephanie Wood is a 79 y.o. female who presents to [facility] for recheck today. She is here for a repeat X-Ray to follow up an abnormal chest X-Ray performed on 07/29/13. She had questionable L rib density. Pt states she has noted improvement, however, she admits to persistent intermittent soreness brought on by twisting. Denies any soreness with deep breathing. At this time she denies any fever or chills.  She reports recent UTI in December 2014 and February 2015. Pt feels she may be experiencing a 3rd UTI and is requesting further evaluation.  No other concerns this visit.  No past medical history on file.   Review of Systems  Constitutional: Negative for fever and chills.  HENT: Negative for congestion.   Respiratory: Negative for cough.   Skin: Negative for rash.    Triage Vitals: BP 132/80  Pulse 83  Temp(Src) 98 F (36.7 C) (Oral)  Resp 17  Ht 5' 2.5" (1.588 m)  Wt 174 lb (78.926 kg)  BMI 31.30 kg/m2  SpO2 97%   Objective:   Physical Exam  Nursing note and vitals reviewed. Constitutional: She is oriented to person, place, and time. She appears well-developed and well-nourished.  HENT:  Head: Normocephalic and atraumatic.  Eyes: EOM are normal.  Neck: Normal range of motion.  Cardiovascular: Normal rate and regular rhythm.   Pulmonary/Chest: Effort normal and breath sounds normal.  No particular tenderness with palpation of the chest wall  Musculoskeletal: Normal range of motion.  Neurological: She is alert and  oriented to person, place, and time.  Skin: Skin is warm and dry.  Psychiatric: She has a normal mood and affect. Her behavior is normal.   [facility] reading (PRIMARY) by  Dr. Laney Pastor repeat views of the chest indicate a similar area around the left 6 rib is noted by the radiologist one month ago/smooth without signs of destructive lesion Lungs are clear        Assessment & Plan:   I personally performed the services described in this documentation, which was scribed in my presence. The recorded information has been reviewed and is accurate.I have completed the patient encounter in its entirety as documented by the scribe, with editing by me where necessary. Amylia Collazos P. Laney Pastor, M.D. Abnormal chest x-ray - Plan: DG Ribs Unilateral W/Chest Left  Recurrent UTI - Plan: POCT UA - Microscopic Only, POCT urinalysis dipstick--- reassured that she has resolved her last infection in February  We will notify her of the radiology interpretation

## 2013-09-15 ENCOUNTER — Other Ambulatory Visit: Payer: Self-pay

## 2013-09-15 DIAGNOSIS — Z1231 Encounter for screening mammogram for malignant neoplasm of breast: Secondary | ICD-10-CM

## 2013-10-15 ENCOUNTER — Ambulatory Visit
Admission: RE | Admit: 2013-10-15 | Discharge: 2013-10-15 | Disposition: A | Discharge status: Home / Self Care | Payer: Medicare Other | Source: Ambulatory Visit

## 2013-10-15 ENCOUNTER — Encounter (INDEPENDENT_AMBULATORY_CARE_PROVIDER_SITE_OTHER): Payer: Self-pay

## 2013-10-15 DIAGNOSIS — Z1231 Encounter for screening mammogram for malignant neoplasm of breast: Secondary | ICD-10-CM

## 2014-03-18 ENCOUNTER — Encounter (INDEPENDENT_AMBULATORY_CARE_PROVIDER_SITE_OTHER): Payer: Self-pay | Admitting: *Deleted

## 2014-03-19 ENCOUNTER — Telehealth (INDEPENDENT_AMBULATORY_CARE_PROVIDER_SITE_OTHER): Payer: Self-pay | Admitting: *Deleted

## 2014-03-19 ENCOUNTER — Encounter (INDEPENDENT_AMBULATORY_CARE_PROVIDER_SITE_OTHER): Payer: Self-pay | Admitting: Internal Medicine

## 2014-03-19 ENCOUNTER — Ambulatory Visit (INDEPENDENT_AMBULATORY_CARE_PROVIDER_SITE_OTHER): Payer: Medicare Other | Admitting: Internal Medicine

## 2014-03-19 ENCOUNTER — Other Ambulatory Visit (INDEPENDENT_AMBULATORY_CARE_PROVIDER_SITE_OTHER): Payer: Self-pay | Admitting: *Deleted

## 2014-03-19 VITALS — BP 114/70 | HR 68 | Temp 97.8°F | Ht 64.0 in | Wt 172.0 lb

## 2014-03-19 DIAGNOSIS — Z86010 Personal history of colonic polyps: Secondary | ICD-10-CM | POA: Insufficient documentation

## 2014-03-19 DIAGNOSIS — Z1211 Encounter for screening for malignant neoplasm of colon: Secondary | ICD-10-CM

## 2014-03-19 NOTE — Progress Notes (Signed)
Subjective:    Patient ID: Stephanie Wood, female    DOB: Oct 12, 1934, 79 y.o.   MRN: [id number]  HPI Here today for abnormal stools. Referred by Dr. Anastasio Champion.  She says in August her stool was darker than normal. She had been taking Iron. She stopped the iron, and her stools were yellow. She also says now she has diarrhea more often than she has in the past. Stools are normally brown. Stools are still yellow.  She is having a BM every day. Stools are not loose every day. She denies seeing any melena or BRRB. Appetite is good. No unintentional weight loss.  Occasionally has lower abdominal discomfort which is related to her BMs. Last of September on antibiotics for a bladder infection.    04/20/2009 Colonoscopy: Hx of colonic adenoma: Dr. Wynelle Bourgeois external hemorrhoids and anal papilla, otherwise normal exam.  08/2011 EGD: Iron deficiency Impression:  Mild changes of reflux esophagitis limited to GE junction.  Moderate size sliding hiatal hernia with abnormal appearance to mucosa and focal gastritis.  Single erosion below the level of hiatus and antral scar.  Normal bulbar and post bulbar mucosa.    Review of Systems Past Medical History  Diagnosis Date  . Colonic adenoma     Past Surgical History  Procedure Laterality Date  . Eye surgery      bil catatract surgery  . Esophagogastroduodenoscopy  09/20/2011    Procedure: ESOPHAGOGASTRODUODENOSCOPY (EGD);  Surgeon: Rogene Houston, MD;  Location: [facility];  Service: Endoscopy;  Laterality: N/A;  215    No Known Allergies  Current Outpatient Prescriptions on File Prior to Visit  Medication Sig Dispense Refill  . alendronate (FOSAMAX) 70 MG tablet Take 70 mg by mouth every 7 (seven) days. Take with a full glass of water on an empty stomach. Takes on Wednesday      . ALPRAZolam (XANAX) 0.5 MG tablet Take 0.5 mg by mouth at bedtime as needed. Sleep      . atorvastatin (LIPITOR) 20 MG tablet Take 20 mg by mouth daily.      .  Calcium Carb-Cholecalciferol (CALCIUM 500 +D) 500-400 MG-UNIT TABS Take 3 tablets by mouth daily.      . cholecalciferol (VITAMIN D) 400 UNITS TABS Take 400 Units by mouth 3 (three) times daily.      . Glucosamine-Chondroitin (COSAMIN DS) 500-400 MG CAPS Take 1 capsule by mouth daily.      . raloxifene (EVISTA) 60 MG tablet Take 60 mg by mouth daily.      . vitamin B-12 (CYANOCOBALAMIN) 1000 MCG tablet Take 1,000 mcg by mouth daily.      Marland Kitchen aspirin EC 81 MG tablet Take 81 mg by mouth daily.       No current facility-administered medications on file prior to visit.        Objective:   Physical Exam Filed Vitals:   03/19/14 0928  BP: 114/70  Pulse: 68  Temp: 97.8 F (36.6 C)  Height: 5\' 4"  (1.626 m)  Weight: 172 lb (78.019 kg)   Alert and oriented. Skin warm and dry. Oral mucosa is moist.   . Sclera anicteric, conjunctivae is pink. Thyroid not enlarged. No cervical lymphadenopathy. Lungs clear. Heart regular rate and rhythm.  Abdomen is soft. Bowel sounds are positive. No hepatomegaly. No abdominal masses felt. No tenderness.  No edema to lower extremities.  Stool brown and guaiac negative.       Assessment & Plan:  Change in her stools.  Stools are yellow. No BRRB or melena. Hx of taking Iron in the past. Hx of adenoma. Due for colonoscopy. Surveillance colonoscopy. The risks and benefits such as perforation, bleeding, and infection were reviewed with the patient and is agreeable. CBC and Cmet

## 2014-03-19 NOTE — Telephone Encounter (Signed)
Patient needs movi prep 

## 2014-03-19 NOTE — Patient Instructions (Signed)
Colonoscopy.  The risks and benefits such as perforation, bleeding, and infection were reviewed with the patient and is agreeable. 

## 2014-03-20 MED ORDER — PEG-KCL-NACL-NASULF-NA ASC-C 100 G PO SOLR: 1.0000 | Freq: Once | ORAL | Status: DC

## 2014-03-25 LAB — COMPREHENSIVE METABOLIC PANEL
ALBUMIN: 4.3 g/dL (ref 3.5–5.2)
ALK PHOS: 79 U/L (ref 39–117)
ALT: 16 U/L (ref 0–35)
AST: 21 U/L (ref 0–37)
BUN: 13 mg/dL (ref 6–23)
CO2: 25 mEq/L (ref 19–32)
Calcium: 9.8 mg/dL (ref 8.4–10.5)
Chloride: 104 mEq/L (ref 96–112)
Creat: 0.95 mg/dL (ref 0.50–1.10)
Glucose, Bld: 98 mg/dL (ref 70–99)
POTASSIUM: 4.1 meq/L (ref 3.5–5.3)
SODIUM: 142 meq/L (ref 135–145)
Total Bilirubin: 0.6 mg/dL (ref 0.2–1.2)
Total Protein: 7.2 g/dL (ref 6.0–8.3)

## 2014-03-25 LAB — CBC WITH DIFFERENTIAL/PLATELET
BASOS ABS: 0 10*3/uL (ref 0.0–0.1)
BASOS PCT: 0 % (ref 0–1)
Eosinophils Absolute: 0.1 10*3/uL (ref 0.0–0.7)
Eosinophils Relative: 2 % (ref 0–5)
HEMATOCRIT: 40.9 % (ref 36.0–46.0)
HEMOGLOBIN: 13.4 g/dL (ref 12.0–15.0)
LYMPHS PCT: 38 % (ref 12–46)
Lymphs Abs: 2.5 10*3/uL (ref 0.7–4.0)
MCH: 28.3 pg (ref 26.0–34.0)
MCHC: 32.8 g/dL (ref 30.0–36.0)
MCV: 86.5 fL (ref 78.0–100.0)
MONOS PCT: 7 % (ref 3–12)
Monocytes Absolute: 0.5 10*3/uL (ref 0.1–1.0)
NEUTROS ABS: 3.6 10*3/uL (ref 1.7–7.7)
Neutrophils Relative %: 53 % (ref 43–77)
Platelets: 193 10*3/uL (ref 150–400)
RBC: 4.73 MIL/uL (ref 3.87–5.11)
RDW: 13.4 % (ref 11.5–15.5)
WBC: 6.7 10*3/uL (ref 4.0–10.5)

## 2014-04-15 ENCOUNTER — Ambulatory Visit (HOSPITAL_COMMUNITY)
Admission: RE | Admit: 2014-04-15 | Discharge: 2014-04-15 | Disposition: A | Discharge status: Home / Self Care | Payer: Medicare Other | Source: Ambulatory Visit | Attending: Internal Medicine | Admitting: Internal Medicine

## 2014-04-15 ENCOUNTER — Encounter (HOSPITAL_COMMUNITY): Payer: Self-pay | Admitting: *Deleted

## 2014-04-15 ENCOUNTER — Encounter (HOSPITAL_COMMUNITY)
Admission: RE | Disposition: A | Discharge status: Home / Self Care | Payer: Self-pay | Source: Ambulatory Visit | Attending: Internal Medicine

## 2014-04-15 DIAGNOSIS — Z86010 Personal history of colonic polyps: Secondary | ICD-10-CM

## 2014-04-15 DIAGNOSIS — K649 Unspecified hemorrhoids: Secondary | ICD-10-CM

## 2014-04-15 DIAGNOSIS — K644 Residual hemorrhoidal skin tags: Secondary | ICD-10-CM | POA: Insufficient documentation

## 2014-04-15 DIAGNOSIS — Z1211 Encounter for screening for malignant neoplasm of colon: Secondary | ICD-10-CM | POA: Insufficient documentation

## 2014-04-15 DIAGNOSIS — Z85038 Personal history of other malignant neoplasm of large intestine: Secondary | ICD-10-CM | POA: Insufficient documentation

## 2014-04-15 DIAGNOSIS — K5790 Diverticulosis of intestine, part unspecified, without perforation or abscess without bleeding: Secondary | ICD-10-CM | POA: Insufficient documentation

## 2014-04-15 DIAGNOSIS — Z7982 Long term (current) use of aspirin: Secondary | ICD-10-CM | POA: Insufficient documentation

## 2014-04-15 DIAGNOSIS — K5730 Diverticulosis of large intestine without perforation or abscess without bleeding: Secondary | ICD-10-CM

## 2014-04-15 HISTORY — DX: Anemia, unspecified: D64.9

## 2014-04-15 HISTORY — PX: COLONOSCOPY: SHX5424

## 2014-04-15 SURGERY — COLONOSCOPY
Anesthesia: Moderate Sedation

## 2014-04-15 MED ORDER — MEPERIDINE HCL 50 MG/ML IJ SOLN
INTRAMUSCULAR | Status: AC
  Filled 2014-04-15: qty 1

## 2014-04-15 MED ORDER — STERILE WATER FOR IRRIGATION IR SOLN
Status: DC | PRN
  Administered 2014-04-15: 08:00:00

## 2014-04-15 MED ORDER — MEPERIDINE HCL 50 MG/ML IJ SOLN
INTRAMUSCULAR | Status: DC | PRN
  Administered 2014-04-15 (×2): 25 mg via INTRAVENOUS

## 2014-04-15 MED ORDER — MIDAZOLAM HCL 5 MG/5ML IJ SOLN
INTRAMUSCULAR | Status: AC
  Filled 2014-04-15: qty 10

## 2014-04-15 MED ORDER — MIDAZOLAM HCL 5 MG/5ML IJ SOLN
INTRAMUSCULAR | Status: DC | PRN
  Administered 2014-04-15: 2 mg via INTRAVENOUS
  Administered 2014-04-15: 1 mg via INTRAVENOUS
  Administered 2014-04-15: 2 mg via INTRAVENOUS

## 2014-04-15 MED ORDER — SODIUM CHLORIDE 0.9 % IV SOLN
INTRAVENOUS | Status: DC
  Administered 2014-04-15: 07:00:00 via INTRAVENOUS

## 2014-04-15 NOTE — Discharge Instructions (Signed)
Resume usual medications and diet. No Driving for 24 hours  Colonoscopy, Care After Refer to this sheet in the next few weeks. These instructions provide you with information on caring for yourself after your procedure. Your health care provider may also give you more specific instructions. Your treatment has been planned according to current medical practices, but problems sometimes occur. Call your health care provider if you have any problems or questions after your procedure. WHAT TO EXPECT AFTER THE PROCEDURE  After your procedure, it is typical to have the following:  A small amount of blood in your stool.  Moderate amounts of gas and mild abdominal cramping or bloating. HOME CARE INSTRUCTIONS  Do not drive, operate machinery, or sign important documents for 24 hours.  You may shower and resume your regular physical activities, but move at a slower pace for the first 24 hours.  Take frequent rest periods for the first 24 hours.  Walk around or put a warm pack on your abdomen to help reduce abdominal cramping and bloating.  Drink enough fluids to keep your urine clear or pale yellow.  You may resume your normal diet as instructed by your health care provider. Avoid heavy or fried foods that are hard to digest.  Avoid drinking alcohol for 24 hours or as instructed by your health care provider.  Only take over-the-counter or prescription medicines as directed by your health care provider.  If a tissue sample (biopsy) was taken during your procedure:  Do not take aspirin or blood thinners for 7 days, or as instructed by your health care provider.  Do not drink alcohol for 7 days, or as instructed by your health care provider.  Eat soft foods for the first 24 hours. SEEK MEDICAL CARE IF: You have persistent spotting of blood in your stool 2-3 days after the procedure. SEEK IMMEDIATE MEDICAL CARE IF:  You have more than a small spotting of blood in your stool.  You pass  large blood clots in your stool.  Your abdomen is swollen (distended).  You have nausea or vomiting.  You have a fever.  You have increasing abdominal pain that is not relieved with medicine. Document Released: 12/28/2003 Document Revised: 03/05/2013 Document Reviewed: 01/20/2013 [facility] Patient Information 2015 [place], [place]. This information is not intended to replace advice given to you by your health care provider. Make sure you discuss any questions you have with your health care provider.

## 2014-04-15 NOTE — Op Note (Signed)
COLONOSCOPY PROCEDURE REPORT  PATIENT:  Stephanie Wood  MR#:  [id number] Birthdate:  01/01/1935, 79 y.o., female Endoscopist:  Dr. Rogene Houston, MD Referred By:  Dr. Robert Bellow, MD Procedure Date: 04/15/2014  Procedure:   Colonoscopy  Indications:  Patient is 79 year old Caucasian female with history of colonic adenoma who is here for surveillance colonoscopy. She had bout with diarrhea few months ago but resolved spontaneously.  Informed Consent:  The procedure and risks were reviewed with the patient and informed consent was obtained.  Medications:  Demerol 50 mg IV Versed 5 mg IV  Description of procedure:  After a digital rectal exam was performed, that colonoscope was advanced from the anus through the rectum and colon to the area of the cecum, ileocecal valve and appendiceal orifice. The cecum was deeply intubated. These structures were well-seen and photographed for the record. From the level of the cecum and ileocecal valve, the scope was slowly and cautiously withdrawn. The mucosal surfaces were carefully surveyed utilizing scope tip to flexion to facilitate fold flattening as needed. The scope was pulled down into the rectum where a thorough exam including retroflexion was performed.  Findings:   Prep excellent. Normal mucosa of cecum, ascending colon, hepatic flexure, transverse colon, splenic flexure, descending and sigmoid colon. Single small diverticulum noted at sigmoid colon. Normal rectal mucosa. Small hemorrhoids below the dentate line along with anal papillae.   Therapeutic/Diagnostic Maneuvers Performed:   None  Complications:  none  Cecal Withdrawal Time:  13 minutes  Impression:  Examination performed to cecum. No evidence of recurrent polyps. Single small diverticulum at sigmoid colon. Small external hemorrhoids and anal papillae.  Recommendations:  Standard instructions given. She may consider future colonoscopies for diagnostic purposes  only.  Jadelin Eng U  04/15/2014 8:09 AM  CC: Dr. Robert Bellow, MD & Dr. Rayne Du ref. provider found

## 2014-04-15 NOTE — H&P (Signed)
Stephanie Wood is an 79 y.o. female.   Chief Complaint:   Patient is here for colonoscopy. HPI:  Patient is 79 year old Caucasian female with history of colonic adenoma and is here for surveillance colonoscopy. Few months ago she had bouts with diarrhea in her stool was dark but not read. She therefore decided to proceed with colonoscopy. She has good appetite. She denies abdominal pain. Family history is negative for CRC persist or also has had colonic polyps.  Past Medical History  Diagnosis Date  . Colonic adenoma   . Anemia     Past Surgical History  Procedure Laterality Date  . Eye surgery      bil catatract surgery  . Esophagogastroduodenoscopy  09/20/2011    Procedure: ESOPHAGOGASTRODUODENOSCOPY (EGD);  Surgeon: Rogene Houston, MD;  [facility]: [facility];  Service: Endoscopy;  Laterality: N/A;  215    History reviewed. No pertinent family history. Social History:  reports that she has never smoked. She does not have any smokeless tobacco history on file. She reports that she does not drink alcohol or use illicit drugs.  Allergies: No Known Allergies  Medications Prior to Admission  Medication Sig Dispense Refill  . alendronate (FOSAMAX) 70 MG tablet Take 70 mg by mouth every 7 (seven) days. Take with a full glass of water on an empty stomach. Takes on Wednesday    . ALPRAZolam (XANAX) 0.5 MG tablet Take 0.5 mg by mouth at bedtime as needed. Sleep    . aspirin EC 81 MG tablet Take 81 mg by mouth daily.    Marland Kitchen atorvastatin (LIPITOR) 20 MG tablet Take 20 mg by mouth daily.    . Calcium Carb-Cholecalciferol (CALCIUM 500 +D) 500-400 MG-UNIT TABS Take 3 tablets by mouth daily.    . cholecalciferol (VITAMIN D) 400 UNITS TABS Take 400 Units by mouth 3 (three) times daily.    . Glucosamine-Chondroitin (COSAMIN DS) 500-400 MG CAPS Take 1 capsule by mouth daily.    . pantoprazole (PROTONIX) 40 MG tablet Take 40 mg by mouth daily.    . peg 3350 powder (MOVIPREP) 100 G SOLR Take 1 kit (200 g  total) by mouth once. 1 kit 0  . raloxifene (EVISTA) 60 MG tablet Take 60 mg by mouth daily.    . vitamin B-12 (CYANOCOBALAMIN) 1000 MCG tablet Take 1,000 mcg by mouth daily.    . Vitamin D, Ergocalciferol, (DRISDOL) 50000 UNITS CAPS capsule Take 50,000 Units by mouth every 7 (seven) days.      No results found for this or any previous visit (from the past 48 hour(s)). No results found.  ROS  Blood pressure 140/75, pulse 84, temperature 97.9 F (36.6 C), temperature source Oral, resp. rate 18, height $RemoveBe'5\' 4"'[id number]$  (1.626 m), weight 173 lb (78.472 kg), SpO2 99 %. Physical Exam  Constitutional: She appears well-developed and well-nourished.  HENT:  Mouth/Throat: Oropharynx is clear and moist.  Eyes: Conjunctivae are normal. No scleral icterus.  Neck: No thyromegaly present.  Cardiovascular: Normal rate, regular rhythm and normal heart sounds.   No murmur heard. Respiratory: Effort normal and breath sounds normal.  GI: Soft. She exhibits no distension and no mass. There is no tenderness.  Musculoskeletal: She exhibits no edema.  Lymphadenopathy:    She has no cervical adenopathy.  Neurological: She is alert.  Skin: Skin is warm and dry.     Assessment/Plan History of colonic adenoma. Recent bout with self-limiting diarrhea. Surveillance colonoscopy  [email or web address] Wood 04/15/2014, 7:31 AM

## 2014-04-17 ENCOUNTER — Encounter (HOSPITAL_COMMUNITY): Payer: Self-pay | Admitting: Internal Medicine

## 2014-09-23 ENCOUNTER — Other Ambulatory Visit: Payer: Self-pay

## 2014-09-23 DIAGNOSIS — Z1231 Encounter for screening mammogram for malignant neoplasm of breast: Secondary | ICD-10-CM

## 2014-10-20 ENCOUNTER — Ambulatory Visit
Admission: RE | Admit: 2014-10-20 | Discharge: 2014-10-20 | Disposition: A | Discharge status: Home / Self Care | Payer: Medicare Other | Source: Ambulatory Visit

## 2014-10-20 DIAGNOSIS — Z1231 Encounter for screening mammogram for malignant neoplasm of breast: Secondary | ICD-10-CM

## 2015-05-30 ENCOUNTER — Emergency Department (HOSPITAL_COMMUNITY)
Admission: EM | Admit: 2015-05-30 | Discharge: 2015-05-30 | Disposition: A | Discharge status: Home / Self Care | Payer: PPO | Attending: Emergency Medicine | Admitting: Emergency Medicine

## 2015-05-30 ENCOUNTER — Encounter (HOSPITAL_COMMUNITY): Payer: Self-pay | Admitting: Emergency Medicine

## 2015-05-30 DIAGNOSIS — Z86010 Personal history of colonic polyps: Secondary | ICD-10-CM | POA: Diagnosis not present

## 2015-05-30 DIAGNOSIS — K270 Acute peptic ulcer, site unspecified, with hemorrhage: Secondary | ICD-10-CM | POA: Insufficient documentation

## 2015-05-30 DIAGNOSIS — Z862 Personal history of diseases of the blood and blood-forming organs and certain disorders involving the immune mechanism: Secondary | ICD-10-CM | POA: Diagnosis not present

## 2015-05-30 DIAGNOSIS — K625 Hemorrhage of anus and rectum: Secondary | ICD-10-CM | POA: Diagnosis not present

## 2015-05-30 DIAGNOSIS — K279 Peptic ulcer, site unspecified, unspecified as acute or chronic, without hemorrhage or perforation: Secondary | ICD-10-CM | POA: Diagnosis not present

## 2015-05-30 DIAGNOSIS — Z79899 Other long term (current) drug therapy: Secondary | ICD-10-CM | POA: Insufficient documentation

## 2015-05-30 LAB — BASIC METABOLIC PANEL
Anion gap: 9 (ref 5–15)
BUN: 20 mg/dL (ref 6–20)
CO2: 25 mmol/L (ref 22–32)
Calcium: 9.8 mg/dL (ref 8.9–10.3)
Chloride: 108 mmol/L (ref 101–111)
Creatinine, Ser: 1.09 mg/dL — ABNORMAL HIGH (ref 0.44–1.00)
GFR calc Af Amer: 55 mL/min — ABNORMAL LOW (ref >60)
GFR, EST NON AFRICAN AMERICAN: 47 mL/min — AB (ref >60)
GLUCOSE: 122 mg/dL — AB (ref 65–99)
POTASSIUM: 4.3 mmol/L (ref 3.5–5.1)
Sodium: 142 mmol/L (ref 135–145)

## 2015-05-30 LAB — CBC WITH DIFFERENTIAL/PLATELET
BASOS ABS: 0 10*3/uL (ref 0.0–0.1)
BASOS PCT: 0 %
EOS ABS: 0.1 10*3/uL (ref 0.0–0.7)
Eosinophils Relative: 1 %
HCT: 41.3 % (ref 36.0–46.0)
HEMOGLOBIN: 13.2 g/dL (ref 12.0–15.0)
Lymphocytes Relative: 39 %
Lymphs Abs: 2.6 10*3/uL (ref 0.7–4.0)
MCH: 27.7 pg (ref 26.0–34.0)
MCHC: 32 g/dL (ref 30.0–36.0)
MCV: 86.6 fL (ref 78.0–100.0)
MONOS PCT: 5 %
Monocytes Absolute: 0.3 10*3/uL (ref 0.1–1.0)
NEUTROS ABS: 3.7 10*3/uL (ref 1.7–7.7)
NEUTROS PCT: 55 %
PLATELETS: 179 10*3/uL (ref 150–400)
RBC: 4.77 MIL/uL (ref 3.87–5.11)
RDW: 13.7 % (ref 11.5–15.5)
WBC: 6.7 10*3/uL (ref 4.0–10.5)

## 2015-05-30 LAB — PROTIME-INR
INR: 0.97 (ref 0.00–1.49)
PROTHROMBIN TIME: 13.1 s (ref 11.6–15.2)

## 2015-05-30 LAB — POC OCCULT BLOOD, ED: Fecal Occult Bld: POSITIVE — AB

## 2015-05-30 MED ORDER — SODIUM CHLORIDE 0.9 % IV BOLUS (SEPSIS)
1000.0000 mL | Freq: Once | INTRAVENOUS | Status: AC
  Administered 2015-05-30: 1000 mL via INTRAVENOUS

## 2015-05-30 MED ORDER — ONDANSETRON HCL 8 MG PO TABS: 8.0000 mg | ORAL_TABLET | ORAL | Status: DC | PRN

## 2015-05-30 MED ORDER — PANTOPRAZOLE SODIUM 20 MG PO TBEC: 20.0000 mg | DELAYED_RELEASE_TABLET | Freq: Two times a day (BID) | ORAL | Status: DC

## 2015-05-30 MED ORDER — PANTOPRAZOLE SODIUM 40 MG IV SOLR
40.0000 mg | Freq: Once | INTRAVENOUS | Status: AC
  Administered 2015-05-30: 40 mg via INTRAVENOUS
  Filled 2015-05-30: qty 40

## 2015-05-30 NOTE — Discharge Instructions (Signed)
Restart your Protonix tonight.  Also prescription for nausea medication. Clear liquids today. Return if worse. Follow-up with your gastroenterologist on Tuesday

## 2015-05-30 NOTE — ED Notes (Signed)
Pt reports two episodes of formed, black stools that began at 0300 this morning. Denies any pain. Denies using blood thinners.

## 2015-05-30 NOTE — ED Provider Notes (Signed)
CSN: [id number]     Arrival date & time 05/30/15  [id number] History  By signing my name below, I, [facility], attest that this documentation has been prepared under the direction and in the presence of Nat Christen, MD. Electronically Signed: Virgel Bouquet, ED Scribe. 05/30/2015. 1:24 PM.   Chief Complaint  Patient presents with  . Rectal Bleeding   The history is provided by the patient. No language interpreter was used.   HPI Comments: Stephanie Wood is a 81 y.o. female with an hx of anemia and colonic adenoma who presents to the Emergency Department complaining of black stool, 2x episodes that began last night. Patient reports that she had 2 bowel movements 7 hours ago that were black in color but were not tarry in consistency. She notes that she was seen 2 months ago and ceased taking her ulcer medication. She has not taken any OTC medications such as Pepto Bismal or attempted any treatments. She denies an hx of similar symptoms in the past but notes that she was seen 2 years ago by Dr. Laural Golden where she had a colonoscopy. Patient reports a previous diagnosis of "erosion in her stomach". She denies abdominal pain and nausea.   Past Medical History  Diagnosis Date  . Colonic adenoma   . Anemia    Past Surgical History  Procedure Laterality Date  . Eye surgery      bil catatract surgery  . Esophagogastroduodenoscopy  09/20/2011    Procedure: ESOPHAGOGASTRODUODENOSCOPY (EGD);  Surgeon: Rogene Houston, MD;  [facility];  Service: Endoscopy;  Laterality: N/A;  215  . Colonoscopy N/A 04/15/2014    Procedure: COLONOSCOPY;  Surgeon: Rogene Houston, MD;  Location: [facility];  Service: Endoscopy;  Laterality: N/A;  730   No family history on file. Social History  Substance Use Topics  . Smoking status: Never Smoker   . Smokeless tobacco: None  . Alcohol Use: No   OB History    No data available     Review of Systems A complete 10 system review of systems was  obtained and all systems are negative except as noted in the HPI and PMH.    Allergies  Review of patient's allergies indicates no known allergies.  Home Medications   Prior to Admission medications   Medication Sig Start Date End Date Taking? Authorizing Provider  ALPRAZolam Duanne Moron) 0.5 MG tablet Take 0.5 mg by mouth at bedtime as needed. Sleep   Yes Historical Provider, MD  atorvastatin (LIPITOR) 20 MG tablet Take 20 mg by mouth daily.   Yes Historical Provider, MD  Calcium Carb-Cholecalciferol (CALCIUM 500 +D) 500-400 MG-UNIT TABS Take 3 tablets by mouth daily.   Yes Historical Provider, MD  cholecalciferol (VITAMIN D) 400 UNITS TABS Take 400 Units by mouth 3 (three) times daily.   Yes Historical Provider, MD  Glucosamine-Chondroitin (COSAMIN DS) 500-400 MG CAPS Take 1 capsule by mouth daily.   Yes Historical Provider, MD  vitamin B-12 (CYANOCOBALAMIN) 1000 MCG tablet Take 1,000 mcg by mouth daily.   Yes Historical Provider, MD  Vitamin D, Ergocalciferol, (DRISDOL) 50000 UNITS CAPS capsule Take 50,000 Units by mouth every 7 (seven) days. Takes on Saturdays.   Yes Historical Provider, MD  ondansetron (ZOFRAN) 8 MG tablet Take 1 tablet (8 mg total) by mouth every 4 (four) hours as needed. 05/30/15   Nat Christen, MD  pantoprazole (PROTONIX) 20 MG tablet Take 1 tablet (20 mg total) by mouth 2 (two) times daily. 05/30/15  Nat Christen, MD   BP 117/65 mmHg  Pulse 73  Temp(Src) 98.4 F (36.9 C) (Oral)  Resp 16  Ht 5\' 4"  (1.626 m)  Wt 173 lb (78.472 kg)  BMI 29.68 kg/m2  SpO2 97% Physical Exam  Constitutional: She is oriented to person, place, and time. She appears well-developed and well-nourished.  HENT:  Head: Normocephalic and atraumatic.  Eyes: Conjunctivae and EOM are normal. Pupils are equal, round, and reactive to light.  Neck: Normal range of motion. Neck supple.  Cardiovascular: Normal rate and regular rhythm.   Pulmonary/Chest: Effort normal and breath sounds normal.  Abdominal:  Soft. Bowel sounds are normal.  Genitourinary:  No masses. Black stool. Heme positive.  Musculoskeletal: Normal range of motion.  Neurological: She is alert and oriented to person, place, and time.  Skin: Skin is warm and dry.  Psychiatric: She has a normal mood and affect. Her behavior is normal.  Nursing note and vitals reviewed.   ED Course  Procedures   DIAGNOSTIC STUDIES: Oxygen Saturation is 96% on RA, adequate by my interpretation.    COORDINATION OF CARE: 9:58 AM Will order IV ulcer medication. Discussed treatment plan with pt at bedside and pt agreed to plan.  Labs Review Labs Reviewed  BASIC METABOLIC PANEL - Abnormal; Notable for the following:    Glucose, Bld 122 (*)    Creatinine, Ser 1.09 (*)    GFR calc non Af Amer 47 (*)    GFR calc Af Amer 55 (*)    All other components within normal limits  POC OCCULT BLOOD, ED - Abnormal; Notable for the following:    Fecal Occult Bld POSITIVE (*)    All other components within normal limits  CBC WITH DIFFERENTIAL/PLATELET  PROTIME-INR    Imaging Review No results found. I have personally reviewed and evaluated these images and lab results as part of my medical decision-making.   EKG Interpretation None      MDM   Final diagnoses:  PUD (peptic ulcer disease)    Patient is hemodynamically stable. Discussed with hospitalist.  We will restart her ulcer medication and nausea medication. She will see Dr. Laural Golden on Tuesday.  Discussed test results with the patient and her husband and her daughter. Return precautions given.  I personally performed the services described in this documentation, which was scribed in my presence. The recorded information has been reviewed and is accurate.    Nat Christen, MD 05/30/15 1326

## 2015-06-01 ENCOUNTER — Telehealth (INDEPENDENT_AMBULATORY_CARE_PROVIDER_SITE_OTHER): Payer: Self-pay | Admitting: Internal Medicine

## 2015-06-01 NOTE — Telephone Encounter (Signed)
Ms. Westbrooks is scheduled on 1.11.17 at 4pm. Thank you.

## 2015-06-01 NOTE — Telephone Encounter (Signed)
Ms. Ofori wen to the ER on Sunday due to having black stool. They didn't keep her but gave her medication. She wanted to make you aware. Please call her if needed.  Pt's ph# [phone] Thank you.

## 2015-06-01 NOTE — Telephone Encounter (Signed)
Per Dr.Rehman the patient will need to be seen this week or next week with him .

## 2015-06-08 DIAGNOSIS — J209 Acute bronchitis, unspecified: Secondary | ICD-10-CM | POA: Diagnosis not present

## 2015-06-08 DIAGNOSIS — J069 Acute upper respiratory infection, unspecified: Secondary | ICD-10-CM | POA: Diagnosis not present

## 2015-06-09 ENCOUNTER — Telehealth (INDEPENDENT_AMBULATORY_CARE_PROVIDER_SITE_OTHER): Payer: Self-pay | Admitting: *Deleted

## 2015-06-09 ENCOUNTER — Encounter (INDEPENDENT_AMBULATORY_CARE_PROVIDER_SITE_OTHER): Payer: Self-pay | Admitting: Internal Medicine

## 2015-06-09 ENCOUNTER — Ambulatory Visit (INDEPENDENT_AMBULATORY_CARE_PROVIDER_SITE_OTHER): Payer: PPO | Admitting: Internal Medicine

## 2015-06-09 VITALS — BP 110/70 | HR 74 | Temp 98.0°F | Resp 18 | Ht 64.0 in | Wt 168.8 lb

## 2015-06-09 DIAGNOSIS — K922 Gastrointestinal hemorrhage, unspecified: Secondary | ICD-10-CM

## 2015-06-09 DIAGNOSIS — K2100 Gastro-esophageal reflux disease with esophagitis, without bleeding: Secondary | ICD-10-CM

## 2015-06-09 NOTE — Progress Notes (Signed)
Presenting complaint;  Recent history of melena.  Subjective:  Patient is 81 year old Caucasian female who has chronic GERD and was last seen in the office in October 2015. Last EGD was in April 2013 revealing moderate sized sliding hiatal hernia changes of reflux esophagitis limited to GE junction erosion and gastritis at the level of hiatus as well as antral gastritis and a scar. H. pylori serology was positive and she was treated with Pylera for 10 days. She was maintained on pantoprazole and had been doing well. Pantoprazole was discontinued on 04/19/2015 at the time of office visit with Dr. [name] because of potential side effects. She did not have any problems until new years Eve when she had to explosive bowel movements and passed tarry stool. She did not experience nausea vomiting abdominal pain or heartburn. She also did not feel lightheaded or dizzy. The next morning which was New Year's Day her stool was still black and she therefore went to emergency room. She was seen by Dr. Nat Christen. Her stool was guaiac positive. H&H was normal. She was felt to be stable and discharged on pantoprazole and arrangements made for her to be seen by me. Patient states her melena cleared on day 2. She feels fine. She denies heartburn regurgitation dysphagia abdominal pain anorexia weight loss. She remains on bland diet. She does not consume fatty or fried foods. She does not take aspirin or other NSAIDs. She was just begun on Levaquin by Dr. Karie Kirks for bronchitis. She denies fever or shortness of breath or chest pain.   Current Medications: Outpatient Encounter Prescriptions as of 06/09/2015  Medication Sig  . ALPRAZolam (XANAX) 0.5 MG tablet Take 0.5 mg by mouth at bedtime as needed. Sleep  . atorvastatin (LIPITOR) 20 MG tablet Take 20 mg by mouth daily.  . Calcium Carbonate (CALCIUM 600 PO) Take by mouth daily.  . Glucosamine-Chondroitin (COSAMIN DS) 500-400 MG CAPS Take 1 capsule by mouth 2  (two) times daily.   Marland Kitchen levofloxacin (LEVAQUIN) 500 MG tablet Take 500 mg by mouth daily.  . pantoprazole (PROTONIX) 20 MG tablet Take 1 tablet (20 mg total) by mouth 2 (two) times daily.  . pantoprazole (PROTONIX) 40 MG tablet Take 40 mg by mouth 2 (two) times daily. Reported on 06/09/2015  . vitamin B-12 (CYANOCOBALAMIN) 1000 MCG tablet Take 1,000 mcg by mouth daily.  . Vitamin D, Ergocalciferol, (DRISDOL) 50000 UNITS CAPS capsule Take 50,000 Units by mouth every 7 (seven) days. Takes on Saturdays.  . [DISCONTINUED] Calcium Carb-Cholecalciferol (CALCIUM 500 +D) 500-400 MG-UNIT TABS Take 3 tablets by mouth daily. Reported on 06/09/2015  . [DISCONTINUED] cholecalciferol (VITAMIN D) 400 UNITS TABS Take 400 Units by mouth 3 (three) times daily. Reported on 06/09/2015  . [DISCONTINUED] ondansetron (ZOFRAN) 8 MG tablet Take 1 tablet (8 mg total) by mouth every 4 (four) hours as needed. (Patient not taking: Reported on 06/09/2015)   No facility-administered encounter medications on file as of 06/09/2015.   Past Medical History  Diagnosis Date  . Colonic adenoma   . Anemia        GERD.       H. pylori gastritis treated in April 2013.  Past Surgical History  Procedure Laterality Date  . Eye surgery      bil catatract surgery  . Esophagogastroduodenoscopy  09/20/2011    Procedure: ESOPHAGOGASTRODUODENOSCOPY (EGD);  Surgeon: Rogene Houston, MD;  [facility]: [facility];  Service: Endoscopy;  Laterality: N/A;  215  . Colonoscopy N/A 04/15/2014  Procedure: COLONOSCOPY;  Surgeon: Rogene Houston, MD;  Location: [facility];  Service: Endoscopy;  Laterality: N/A;  730     Objective: Blood pressure 110/70, pulse 74, temperature 98 F (36.7 C), temperature source Oral, resp. rate 18, height 5\' 4"  (1.626 m), weight 168 lb 12.8 oz (76.567 kg). Patient is alert and in no acute distress. Conjunctiva is pink. Sclera is nonicteric Oropharyngeal mucosa is normal. No neck masses or thyromegaly  noted. Cardiac exam with regular rhythm normal S1 and S2. No murmur or gallop noted. Lungs are clear to auscultation. Abdomen is symmetrical. On palpation is soft and nontender without organomegaly or masses. Rectal examination reveals brown soft stool in rectal vault and it is guaiac negative. No LE edema or clubbing noted.  Labs/studies Results: Lab data from 05/30/2015  WBC 6.7, H&H 13.1 41.3 and platelet count 179K   serum sodium 142, potassium 4.3, chloride 108, CO2 25, glucose 122, BUN 20 and creatinine 1.09. Calcium was 9.8. INR 0.97.  Chest film from March 2015 an old CT January 2008 reviewed with patient. Both of these studies show prominent hiatal hernia.  Assessment:  #1. Recent episode of melena most likely from upper GI tract and more specifically related to hiatal hernia. She has been well documented to have moderate-sized sliding hiatal hernia and she had inflammatory changes at level of hiatus despite being on PPI. I believe gastritis may have worsened off PPI. Her stool is guaiac negative and she has no symptoms. Therefore I do not believe endoscopic evaluation warranted unless she has another episode of melena or if her H&H has dropped since it was checked 10 days ago. Doubt that she bled from her colon. Last colonoscopy was in November 2015 and she only had one small diverticulum at sigmoid colon. Given that patient has significant size hiatal hernia benefit of PPI use outweighs the risk. If she does well with full dose dose could be reduced in the next 6-12 months.   Plan:  Patient will go the lab for H&H. Continue pantoprazole at 40 mg by mouth twice a day for another 3 weeks. Thereafter dose will be decreased to 40 mg by mouth every morning. Patient advised to call if she has another episode of melena. Office visit in 6 months.

## 2015-06-09 NOTE — Patient Instructions (Signed)
Physician will call with result of blood work. Call if you experience black stools again. Please call office when current pantoprazole prescription runs out.

## 2015-06-09 NOTE — Telephone Encounter (Signed)
Per Dr.Rehman the patient will need to have labs drawn. 

## 2015-06-14 ENCOUNTER — Telehealth (INDEPENDENT_AMBULATORY_CARE_PROVIDER_SITE_OTHER): Payer: Self-pay | Admitting: Internal Medicine

## 2015-06-14 NOTE — Telephone Encounter (Signed)
Stephanie Wood called saying she needs a refill of Pantoprazole. She wants the Rx sent to [facility]. Please give her a call if needed.  Pt's ph# [phone] Thank you.

## 2015-06-14 NOTE — Telephone Encounter (Signed)
Patient was seen in the office 06/09/15. At this time Dr.Rehman had ask that she stay on the Pantoprazole 40 mg twice daily for another 3 weeks. This would be around 07/02/15. I have left a message for the patient with this information asking that she call our office back prior to [place] calling in the prescription.

## 2015-06-15 DIAGNOSIS — K922 Gastrointestinal hemorrhage, unspecified: Secondary | ICD-10-CM | POA: Diagnosis not present

## 2015-06-15 LAB — HEMOGLOBIN AND HEMATOCRIT, BLOOD
HEMATOCRIT: 37.3 % (ref 36.0–46.0)
HEMOGLOBIN: 12 g/dL (ref 12.0–15.0)

## 2015-06-16 NOTE — Telephone Encounter (Signed)
A prescription was called to [facility] for Pantoprazole 40 mg - take 1 by mouth daily. #30 5 refills. The patient was called and made aware.

## 2015-08-10 DIAGNOSIS — J101 Influenza due to other identified influenza virus with other respiratory manifestations: Secondary | ICD-10-CM | POA: Diagnosis not present

## 2015-08-13 DIAGNOSIS — J101 Influenza due to other identified influenza virus with other respiratory manifestations: Secondary | ICD-10-CM | POA: Diagnosis not present

## 2015-09-13 ENCOUNTER — Other Ambulatory Visit: Payer: Self-pay

## 2015-09-13 DIAGNOSIS — Z1231 Encounter for screening mammogram for malignant neoplasm of breast: Secondary | ICD-10-CM

## 2015-10-26 ENCOUNTER — Ambulatory Visit
Admission: RE | Admit: 2015-10-26 | Discharge: 2015-10-26 | Disposition: A | Discharge status: Home / Self Care | Payer: PPO | Source: Ambulatory Visit

## 2015-10-26 DIAGNOSIS — Z1231 Encounter for screening mammogram for malignant neoplasm of breast: Secondary | ICD-10-CM

## 2015-11-01 ENCOUNTER — Telehealth (INDEPENDENT_AMBULATORY_CARE_PROVIDER_SITE_OTHER): Payer: Self-pay | Admitting: Internal Medicine

## 2015-11-01 NOTE — Telephone Encounter (Signed)
Patient called, stated that Dr. Laural Golden wanted her to have Dr. Karie Kirks check her hemoglobin, but she hasn't been back to have it checked.  She wants to know if she needs to get Dr. Karie Kirks to check it or if Dr. Laural Golden want to check it.  [phone]

## 2015-11-01 NOTE — Telephone Encounter (Signed)
Patient has an appointment with Dr.Knowlton at the end of this month. She will have him check it and send [place] a copy of the result. She has an appointment with D.Rehman on 12/14/2015.

## 2015-11-26 DIAGNOSIS — K279 Peptic ulcer, site unspecified, unspecified as acute or chronic, without hemorrhage or perforation: Secondary | ICD-10-CM | POA: Diagnosis not present

## 2015-11-26 DIAGNOSIS — D509 Iron deficiency anemia, unspecified: Secondary | ICD-10-CM | POA: Diagnosis not present

## 2015-11-26 DIAGNOSIS — N183 Chronic kidney disease, stage 3 (moderate): Secondary | ICD-10-CM | POA: Diagnosis not present

## 2015-11-26 DIAGNOSIS — I10 Essential (primary) hypertension: Secondary | ICD-10-CM | POA: Diagnosis not present

## 2015-12-02 DIAGNOSIS — E6609 Other obesity due to excess calories: Secondary | ICD-10-CM | POA: Diagnosis not present

## 2015-12-02 DIAGNOSIS — E782 Mixed hyperlipidemia: Secondary | ICD-10-CM | POA: Diagnosis not present

## 2015-12-02 DIAGNOSIS — M8580 Other specified disorders of bone density and structure, unspecified site: Secondary | ICD-10-CM | POA: Diagnosis not present

## 2015-12-02 DIAGNOSIS — R5383 Other fatigue: Secondary | ICD-10-CM | POA: Diagnosis not present

## 2015-12-14 ENCOUNTER — Encounter (INDEPENDENT_AMBULATORY_CARE_PROVIDER_SITE_OTHER): Payer: Self-pay | Admitting: Internal Medicine

## 2015-12-14 ENCOUNTER — Ambulatory Visit (INDEPENDENT_AMBULATORY_CARE_PROVIDER_SITE_OTHER): Payer: PPO | Admitting: Internal Medicine

## 2015-12-14 VITALS — BP 110/82 | HR 72 | Temp 98.2°F | Resp 18 | Ht 64.0 in | Wt 168.1 lb

## 2015-12-14 DIAGNOSIS — D509 Iron deficiency anemia, unspecified: Secondary | ICD-10-CM | POA: Diagnosis not present

## 2015-12-14 DIAGNOSIS — K2100 Gastro-esophageal reflux disease with esophagitis, without bleeding: Secondary | ICD-10-CM

## 2015-12-14 DIAGNOSIS — Z8711 Personal history of peptic ulcer disease: Secondary | ICD-10-CM

## 2015-12-14 MED ORDER — FLINTSTONES PLUS IRON PO CHEW: 1.0000 | CHEWABLE_TABLET | Freq: Two times a day (BID) | ORAL | Status: DC

## 2015-12-14 NOTE — Patient Instructions (Signed)
CBC in 3 months. Hemoccult 1. H. pylori stool antigen.

## 2015-12-14 NOTE — Progress Notes (Signed)
Presenting complaint;  Follow-up for GERD and iron deficiency anemia.  Subjective:  Patient is 81 year old Caucasian female who is here for scheduled visit. She has chronic GERD. She has known moderate-sized sliding hiatal hernia. She was seen in January this year after having experienced melena. And when she was seen in the office her hemoglobin was 13.1 g and stool was guaiac negative. Patient was advised to continue pantoprazole twice daily for 3 weeks and thereafter once a day. She feels better. She rarely has heartburn. She denies nausea vomiting regurgitation or dysphagia. She has good appetite. She hasn't had any more episodes of melena. She says she is taking pantoprazole after supper. She states she is eating small amount of honey every day as she asserted helps with GI problems. She stays busy. [facility] for [facility] reasons. She has not lost any weight. Patient is concerned if H. pylori infection is back.    Current Medications: Outpatient Encounter Prescriptions as of 12/14/2015  Medication Sig  . ALPRAZolam (XANAX) 0.5 MG tablet Take 0.5 mg by mouth at bedtime as needed. Sleep  . atorvastatin (LIPITOR) 20 MG tablet Take 20 mg by mouth daily.  . Calcium Carbonate (CALCIUM 600 PO) Take by mouth daily.  . Glucosamine-Chondroitin (COSAMIN DS) 500-400 MG CAPS Take 1 capsule by mouth 2 (two) times daily.   . pantoprazole (PROTONIX) 40 MG tablet Take 40 mg by mouth 2 (two) times daily. Reported on 06/09/2015  . vitamin B-12 (CYANOCOBALAMIN) 1000 MCG tablet Take 1,000 mcg by mouth daily.  . Vitamin D, Ergocalciferol, (DRISDOL) 50000 UNITS CAPS capsule Take 50,000 Units by mouth every 7 (seven) days. Takes on Saturdays.  . Pediatric Multivitamins-Iron (FLINTSTONES PLUS IRON) chewable tablet Chew 1 tablet by mouth 2 (two) times daily.  . [DISCONTINUED] levofloxacin (LEVAQUIN) 500 MG tablet Take 500 mg by mouth daily. Reported on 12/14/2015    No facility-administered encounter medications on file as of 12/14/2015.     Objective: Blood pressure 110/82, pulse 72, temperature 98.2 F (36.8 C), temperature source Oral, resp. rate 18, height 5\' 4"  (1.626 m), weight 168 lb 1.6 oz (76.25 kg). Patient is alert and in no acute distress. Conjunctiva is pink. Sclera is nonicteric Oropharyngeal mucosa is normal. No neck masses or thyromegaly noted. Cardiac exam with regular rhythm normal S1 and S2. No murmur or gallop noted. Lungs are clear to auscultation. Abdomen is full but soft and nontender without organomegaly or masses. No LE edema or clubbing noted.  Labs/studies Results: Lab data from 12/02/2015  WBC 6.3, H&H 11.6 and 37.2 and platelet count 228K. MCV 79.    Assessment:  #1. Chronic GERD. She has known moderate-sized sliding hiatal hernia with evidence of inflammation involving herniated part of the stomach possible source of GI bleed 6 months ago. Her symptoms are well controlled with single dose PPI. I'm afraid she will need to be on chronic PPI therapy. #2. History of H. pylori gastritis. She was treated back in April 2013. She did not undergo follow-up test to confirm eradication.  #3. Microcytic anemia. She may have iron malabsorption with chronic acid suppression. Need to make sure she is not losing blood from the GI tract.   Plan:  Patient advised to take pantoprazole 40 mg by mouth 30 minutes before breakfast and evening meal daily and not after supper. Hemoccult 1. H. pylori stool antigen. Flintstones with iron chewable 2 tablets daily. CBC in 3 months. Office  visit in one year.

## 2015-12-15 ENCOUNTER — Other Ambulatory Visit (INDEPENDENT_AMBULATORY_CARE_PROVIDER_SITE_OTHER): Payer: Self-pay | Admitting: *Deleted

## 2015-12-15 DIAGNOSIS — D509 Iron deficiency anemia, unspecified: Secondary | ICD-10-CM

## 2015-12-16 ENCOUNTER — Telehealth (INDEPENDENT_AMBULATORY_CARE_PROVIDER_SITE_OTHER): Payer: Self-pay | Admitting: Internal Medicine

## 2015-12-16 ENCOUNTER — Other Ambulatory Visit (INDEPENDENT_AMBULATORY_CARE_PROVIDER_SITE_OTHER): Payer: Self-pay | Admitting: *Deleted

## 2015-12-16 ENCOUNTER — Telehealth (INDEPENDENT_AMBULATORY_CARE_PROVIDER_SITE_OTHER): Payer: Self-pay | Admitting: *Deleted

## 2015-12-16 MED ORDER — PANTOPRAZOLE SODIUM 40 MG PO TBEC: 40.0000 mg | DELAYED_RELEASE_TABLET | Freq: Two times a day (BID) | ORAL | Status: DC

## 2015-12-16 NOTE — Telephone Encounter (Signed)
Patient presented to the office, stated that she's not sure if she's to continue Pantoprazole or not.  If so, she needs a refill called to [place].  [phone]

## 2015-12-16 NOTE — Telephone Encounter (Signed)
Patient advised to take pantoprazole 40 mg by mouth 30 minutes before breakfast and evening meal daily and not after supper - this is per Dr.Rehman at the patient's OV 12/14/2015. Rx was e-scribed to [facility].

## 2015-12-16 NOTE — Telephone Encounter (Signed)
Patient was seen in the office 12/13/2015. Dr.Rehman recorded this in his plan. Patient advised to take pantoprazole 40 mg by mouth 30 minutes before breakfast and evening meal daily and not after supper.  I will send a RX to her Pharmacy.

## 2015-12-16 NOTE — Telephone Encounter (Signed)
   Diagnosis:    Result(s)   Card 1: Negative:            Completed by: Thomas Hoff , LPN   HEMOCCULT SENSA DEVELOPER: LOT#:  [id number] EXPIRATION DATE: 01/2016   HEMOCCULT SENSA CARD:  LOT#:  02/[id number] EXPIRATION DATE: 07/18   CARD CONTROL RESULTS:  POSITIVE: Positive NEGATIVE: Negative    ADDITIONAL COMMENTS: Patient has been called , result given.

## 2015-12-17 LAB — HELICOBACTER PYLORI  SPECIAL ANTIGEN: H. PYLORI Antigen: NOT DETECTED

## 2015-12-22 DIAGNOSIS — H40013 Open angle with borderline findings, low risk, bilateral: Secondary | ICD-10-CM | POA: Diagnosis not present

## 2015-12-22 DIAGNOSIS — H353131 Nonexudative age-related macular degeneration, bilateral, early dry stage: Secondary | ICD-10-CM | POA: Diagnosis not present

## 2015-12-22 DIAGNOSIS — H04123 Dry eye syndrome of bilateral lacrimal glands: Secondary | ICD-10-CM | POA: Diagnosis not present

## 2015-12-22 DIAGNOSIS — H35372 Puckering of macula, left eye: Secondary | ICD-10-CM | POA: Diagnosis not present

## 2015-12-22 DIAGNOSIS — Z961 Presence of intraocular lens: Secondary | ICD-10-CM | POA: Diagnosis not present

## 2015-12-22 NOTE — Telephone Encounter (Signed)
Stool is guaiac negative. 

## 2015-12-30 DIAGNOSIS — D235 Other benign neoplasm of skin of trunk: Secondary | ICD-10-CM | POA: Diagnosis not present

## 2015-12-30 DIAGNOSIS — L814 Other melanin hyperpigmentation: Secondary | ICD-10-CM | POA: Diagnosis not present

## 2015-12-30 DIAGNOSIS — L821 Other seborrheic keratosis: Secondary | ICD-10-CM | POA: Diagnosis not present

## 2015-12-30 DIAGNOSIS — D1801 Hemangioma of skin and subcutaneous tissue: Secondary | ICD-10-CM | POA: Diagnosis not present

## 2015-12-30 DIAGNOSIS — L570 Actinic keratosis: Secondary | ICD-10-CM | POA: Diagnosis not present

## 2016-01-03 DIAGNOSIS — N390 Urinary tract infection, site not specified: Secondary | ICD-10-CM | POA: Diagnosis not present

## 2016-01-03 DIAGNOSIS — N3000 Acute cystitis without hematuria: Secondary | ICD-10-CM | POA: Diagnosis not present

## 2016-01-13 ENCOUNTER — Encounter (INDEPENDENT_AMBULATORY_CARE_PROVIDER_SITE_OTHER): Payer: Self-pay

## 2016-01-19 ENCOUNTER — Telehealth (INDEPENDENT_AMBULATORY_CARE_PROVIDER_SITE_OTHER): Payer: Self-pay | Admitting: Internal Medicine

## 2016-01-19 NOTE — Telephone Encounter (Signed)
To address with Dr.Rehman. 

## 2016-01-19 NOTE — Telephone Encounter (Signed)
Patient called, stated that she is having diarrhea.  Stated that she stated a medication, Pantoprazole, wants to know if she should stop this.  [phone]

## 2016-01-20 NOTE — Telephone Encounter (Signed)
Patient states that since she has been taking Pantoprazole twice a day she is having diarrhea. Per Dr.Rehman patient may stop 1 per day. Patient was called and made aware.

## 2016-02-10 ENCOUNTER — Telehealth (INDEPENDENT_AMBULATORY_CARE_PROVIDER_SITE_OTHER): Payer: Self-pay | Admitting: *Deleted

## 2016-02-10 NOTE — Telephone Encounter (Signed)
Stephanie Wood states that she continues to have bowels issues after decreasing the Pantoprazole to 1 per day. Last week they were normal to loose to soft. Monday , Tuesday , Wednesday of this week they were normal , the first time that she really remembers. Today she has had loose BM very foul per patient, followed by 2 blowouts. The last time she noticed a large amount of mucous but no blood.  She stopped her iron for several days , no black stools, she has started taking it again. She has stopped drinking her coffee, eating foods with acids. She c/o lower abdominal pain with a burning sensation at times.  Patient was advised that this would be discussed with Dr.Rehman.

## 2016-02-11 ENCOUNTER — Other Ambulatory Visit (INDEPENDENT_AMBULATORY_CARE_PROVIDER_SITE_OTHER): Payer: Self-pay | Admitting: *Deleted

## 2016-02-11 DIAGNOSIS — R197 Diarrhea, unspecified: Secondary | ICD-10-CM

## 2016-02-11 NOTE — Telephone Encounter (Signed)
Per Dr.Rehman the patient may stop the Pantoprazole. She may get the Pepcid OTC 20 mg - take 1 by mouth twice a day. We will do a fecal lactoferrin , stool culture , and O&P. The orders have been completed and sent to the pharmacy. Patient has been called and made aware.

## 2016-02-14 LAB — OVA AND PARASITE EXAMINATION: OP: NONE SEEN

## 2016-02-14 LAB — FECAL LACTOFERRIN, QUANT: Lactoferrin: NEGATIVE

## 2016-02-17 LAB — STOOL CULTURE

## 2016-02-21 ENCOUNTER — Encounter (INDEPENDENT_AMBULATORY_CARE_PROVIDER_SITE_OTHER): Payer: Self-pay | Admitting: *Deleted

## 2016-02-21 ENCOUNTER — Other Ambulatory Visit (INDEPENDENT_AMBULATORY_CARE_PROVIDER_SITE_OTHER): Payer: Self-pay | Admitting: *Deleted

## 2016-02-21 DIAGNOSIS — D509 Iron deficiency anemia, unspecified: Secondary | ICD-10-CM

## 2016-03-01 ENCOUNTER — Encounter (INDEPENDENT_AMBULATORY_CARE_PROVIDER_SITE_OTHER): Payer: Self-pay | Admitting: Internal Medicine

## 2016-03-01 NOTE — Progress Notes (Signed)
Patient was given an appointment for 04/01/16 at 10:45am and a letter was mailed to the patient.

## 2016-03-16 DIAGNOSIS — D509 Iron deficiency anemia, unspecified: Secondary | ICD-10-CM | POA: Diagnosis not present

## 2016-03-16 LAB — CBC
HEMATOCRIT: 42.6 % (ref 35.0–45.0)
Hemoglobin: 13.6 g/dL (ref 11.7–15.5)
MCH: 26.3 pg — ABNORMAL LOW (ref 27.0–33.0)
MCHC: 31.9 g/dL — ABNORMAL LOW (ref 32.0–36.0)
MCV: 82.4 fL (ref 80.0–100.0)
MPV: 10.5 fL (ref 7.5–12.5)
Platelets: 206 10*3/uL (ref 140–400)
RBC: 5.17 MIL/uL — AB (ref 3.80–5.10)
RDW: 16.7 % — AB (ref 11.0–15.0)
WBC: 6.4 10*3/uL (ref 3.8–10.8)

## 2016-04-11 ENCOUNTER — Encounter (INDEPENDENT_AMBULATORY_CARE_PROVIDER_SITE_OTHER): Payer: Self-pay | Admitting: Internal Medicine

## 2016-04-11 ENCOUNTER — Encounter (INDEPENDENT_AMBULATORY_CARE_PROVIDER_SITE_OTHER): Payer: Self-pay

## 2016-04-11 ENCOUNTER — Ambulatory Visit (INDEPENDENT_AMBULATORY_CARE_PROVIDER_SITE_OTHER): Payer: PPO | Admitting: Internal Medicine

## 2016-04-11 VITALS — BP 108/62 | HR 66 | Temp 98.4°F | Resp 18 | Ht 64.0 in | Wt 163.1 lb

## 2016-04-11 DIAGNOSIS — K2100 Gastro-esophageal reflux disease with esophagitis, without bleeding: Secondary | ICD-10-CM

## 2016-04-11 DIAGNOSIS — K580 Irritable bowel syndrome with diarrhea: Secondary | ICD-10-CM | POA: Diagnosis not present

## 2016-04-11 DIAGNOSIS — D508 Other iron deficiency anemias: Secondary | ICD-10-CM

## 2016-04-11 MED ORDER — FLINTSTONES PLUS IRON PO CHEW: 1.0000 | CHEWABLE_TABLET | ORAL | Status: DC

## 2016-04-11 MED ORDER — BENEFIBER DRINK MIX PO PACK: 4.0000 g | PACK | Freq: Every day | ORAL | Status: DC

## 2016-04-11 NOTE — Progress Notes (Signed)
Presenting complaint;  Follow-up for diarrhea iron deficiency anemia and GERD.  Database and Subjective:  Patient is 81 year old Caucasian female who is here for scheduled visit. She was last seen on 12/14/2015 for symptoms of GERD iron deficiency anemia. She has known moderate-sized sliding hiatal hernia. Her stool was guaiac negative. She was going begun on PPI developed diarrhea. She was switched to famotidine. Regarding the diarrhea she had stool studies and these are negative. She was begun on Flintstones with iron and Imodium OTC. Her hemoglobin on 12/02/2015 was 11.6 and MCV was 79.  She returns for follow-up visit today. She is feeling much better. She says famotidine is working. She says she has diarrhea when she is stressed out. She has lost 5 pounds which she blames on stress She does not eat well. She has Stool diary and most is she has one to 3 bowel movements and more than 50% of her stools are formed. She denies melena or rectal bleeding.   Current Medications: Outpatient Encounter Prescriptions as of 04/11/2016  Medication Sig  . ALPRAZolam (XANAX) 0.5 MG tablet Take 0.5 mg by mouth at bedtime as needed. Sleep  . atorvastatin (LIPITOR) 20 MG tablet Take 20 mg by mouth daily.  . Calcium Carbonate (CALCIUM 600 PO) Take by mouth daily.  . famotidine (PEPCID) 20 MG tablet Take 20 mg by mouth 2 (two) times daily.  . Glucosamine-Chondroitin (COSAMIN DS) 500-400 MG CAPS Take 1 capsule by mouth 2 (two) times daily.   Marland Kitchen loperamide (IMODIUM) 2 MG capsule Take 4 mg by mouth as needed for diarrhea or loose stools. Patient states that she takes 3/4 daily.  Marland Kitchen OVER THE COUNTER MEDICATION Take by mouth 3 (three) times daily. IBguard  . vitamin B-12 (CYANOCOBALAMIN) 1000 MCG tablet Take 1,000 mcg by mouth daily.  . Vitamin D, Ergocalciferol, (DRISDOL) 50000 UNITS CAPS capsule Take 50,000 Units by mouth every 7 (seven) days. Takes on Saturdays.  . [DISCONTINUED] pantoprazole (PROTONIX) 40 MG  tablet Take 1 tablet (40 mg total) by mouth 2 (two) times daily before a meal. 30 minutes Before breakfast and 30 minutes before Supper. (Patient not taking: Reported on 04/11/2016)  . [DISCONTINUED] Pediatric Multivitamins-Iron (FLINTSTONES PLUS IRON) chewable tablet Chew 1 tablet by mouth 2 (two) times daily. (Patient not taking: Reported on 04/11/2016)   No facility-administered encounter medications on file as of 04/11/2016.      Objective: Blood pressure 108/62, pulse 66, temperature 98.4 F (36.9 C), temperature source Oral, resp. rate 18, height 5\' 4"  (1.626 m), weight 163 lb 1.6 oz (74 kg). Patient is alert and in no acute distress. Conjunctiva is pink. Sclera is nonicteric Oropharyngeal mucosa is normal. No neck masses or thyromegaly noted. Cardiac exam with regular rhythm normal S1 and S2. No murmur or gallop noted. Lungs are clear to auscultation. Abdomen is full. Bowel sounds are hyperactive. On palpation abdomen is soft and nontender without organomegaly or masses. No LE edema or clubbing noted.  Labs/studies Results: Lab data from 03/16/2016.   WBC 6.4, H&H 13.6 and 42.6 and platelet count 206K.  Assessment:  #1. Chronic GERD. She is doing well with anti-reflex measures and famotidine. #2. Diarrhea. Stool studies were negative. She has responded to low-dose Imodium. She needs to take it on schedule rather than when necessary. Diarrhea clearly is related to stress on account for brother's illness. #3. Iron deficiency anemia. Stool was guaiac negative. She responded to by mouth iron. Suspect iron deficiency anemia due to impaired iron absorption. She had  colonoscopy 2 years ago. No indication to repeat colonoscopy at this time.   Plan:  Patient advised to take loperamide OTC 2 mg every morning and second dose on as-needed basis. Benefiber or equivalent 4 g by mouth daily at bedtime. She will continue famotidine and Flintstone with iron as before. Office visit in 6  months.

## 2016-04-11 NOTE — Patient Instructions (Signed)
Can use Dulcolax suppository for constipation as needed. Do not take laxative by mouth.

## 2016-05-01 ENCOUNTER — Telehealth (INDEPENDENT_AMBULATORY_CARE_PROVIDER_SITE_OTHER): Payer: Self-pay | Admitting: Internal Medicine

## 2016-05-01 NOTE — Telephone Encounter (Signed)
Patient was called and she states that she has taken the Benefiber only one time. When she did, it was like getting ready for a Colonoscopy. She has not taken anymore due to this.  Are there any further recommendation?

## 2016-05-01 NOTE — Telephone Encounter (Signed)
Patient states Dr. Laural Golden has her on Benefiber and she just can't take it.  She stated it goes right through her and is worse than preparing for a colonoscopy.  [phone]

## 2016-05-02 NOTE — Telephone Encounter (Signed)
Per Dr.Rehman - patient does not have to take ,however: she needs to try and get more fiber in the diet with food. Patient was called and advised.

## 2016-05-26 DIAGNOSIS — I10 Essential (primary) hypertension: Secondary | ICD-10-CM | POA: Diagnosis not present

## 2016-05-26 DIAGNOSIS — F419 Anxiety disorder, unspecified: Secondary | ICD-10-CM | POA: Diagnosis not present

## 2016-05-26 DIAGNOSIS — N183 Chronic kidney disease, stage 3 (moderate): Secondary | ICD-10-CM | POA: Diagnosis not present

## 2016-05-26 DIAGNOSIS — D509 Iron deficiency anemia, unspecified: Secondary | ICD-10-CM | POA: Diagnosis not present

## 2016-06-05 DIAGNOSIS — E6609 Other obesity due to excess calories: Secondary | ICD-10-CM | POA: Diagnosis not present

## 2016-06-05 DIAGNOSIS — D509 Iron deficiency anemia, unspecified: Secondary | ICD-10-CM | POA: Diagnosis not present

## 2016-06-05 DIAGNOSIS — N183 Chronic kidney disease, stage 3 (moderate): Secondary | ICD-10-CM | POA: Diagnosis not present

## 2016-06-05 DIAGNOSIS — I10 Essential (primary) hypertension: Secondary | ICD-10-CM | POA: Diagnosis not present

## 2016-07-19 ENCOUNTER — Telehealth (INDEPENDENT_AMBULATORY_CARE_PROVIDER_SITE_OTHER): Payer: Self-pay | Admitting: Internal Medicine

## 2016-07-19 NOTE — Telephone Encounter (Signed)
Patient stated that she takes Imodium every morning.  Ibgard irritated her more so she stopped, but she took two yesterday and one today and has done alright so far.  She didn't take Imodium yesterday, but she had a big bowel movement last night and looked like mucus and had some blood in it.  She took Imodium this morning and it happened again, still looked like it was mucus in it, but no blood.  Her question is this normal?  [phone]

## 2016-07-26 NOTE — Telephone Encounter (Signed)
This will be addressed with Dr.Rehman. 

## 2016-07-27 NOTE — Telephone Encounter (Signed)
Talked with patient. She states that she is doing well taking the IBgard, just that one time did she see the mucous and just a few streaks of blood in that. She is keeping a stool diary and will bring that in at her appointment in May. Patient will be given two stool card, she will collect one , then weeks later do another, bring them to [place] to check for blood. Patient was advised to call in if she had any other concerns prior to her OV in May.

## 2016-08-09 ENCOUNTER — Other Ambulatory Visit (INDEPENDENT_AMBULATORY_CARE_PROVIDER_SITE_OTHER): Payer: Self-pay | Admitting: *Deleted

## 2016-08-09 ENCOUNTER — Telehealth (INDEPENDENT_AMBULATORY_CARE_PROVIDER_SITE_OTHER): Payer: Self-pay | Admitting: *Deleted

## 2016-08-09 DIAGNOSIS — K921 Melena: Secondary | ICD-10-CM | POA: Diagnosis not present

## 2016-08-09 DIAGNOSIS — R194 Change in bowel habit: Secondary | ICD-10-CM | POA: Diagnosis not present

## 2016-08-09 NOTE — Telephone Encounter (Signed)
   Diagnosis:    Result(s)   Card 1:  Negative:    :       Completed by: Thomas Hoff, LPN   HEMOCCULT SENSA DEVELOPER: TKZ#:[id number]   EXPIRATION DATE: 2020/05   HEMOCCULT SENSA CARD:  ATF#:[id number]   EXPIRATION DATE: 03/20   CARD CONTROL RESULTS:  POSITIVE: Positive  NEGATIVE: Negative    ADDITIONAL COMMENTS: Patient was given her results.  Forwarded to [place] for review.

## 2016-08-10 NOTE — Telephone Encounter (Signed)
Stool is guaiac negative. 

## 2016-08-16 ENCOUNTER — Telehealth (INDEPENDENT_AMBULATORY_CARE_PROVIDER_SITE_OTHER): Payer: Self-pay | Admitting: *Deleted

## 2016-08-16 NOTE — Telephone Encounter (Signed)
Patient said that she had a normal stool this morning, Ate breakfast, afterwards she has a loose yellow stool and saw mucous with 2 spots of blood in it.  Hemoccult done lat week was negative for blood.  Per Dr.Rehman - patient will need to bring a hemoccult in when she sees the red,(get those and put on card) so that we may check it for blood. Also , Questran 2 grams twice a day for 1 month. This is called to the patient pharmacy and the patient was made aware.

## 2016-09-15 ENCOUNTER — Other Ambulatory Visit: Payer: Self-pay | Admitting: Family Medicine

## 2016-09-15 DIAGNOSIS — Z1231 Encounter for screening mammogram for malignant neoplasm of breast: Secondary | ICD-10-CM

## 2016-09-20 DIAGNOSIS — Z01419 Encounter for gynecological examination (general) (routine) without abnormal findings: Secondary | ICD-10-CM | POA: Diagnosis not present

## 2016-09-25 DIAGNOSIS — R1032 Left lower quadrant pain: Secondary | ICD-10-CM | POA: Diagnosis not present

## 2016-10-03 ENCOUNTER — Other Ambulatory Visit (INDEPENDENT_AMBULATORY_CARE_PROVIDER_SITE_OTHER): Payer: Self-pay | Admitting: *Deleted

## 2016-10-03 DIAGNOSIS — R194 Change in bowel habit: Secondary | ICD-10-CM

## 2016-10-10 ENCOUNTER — Ambulatory Visit (INDEPENDENT_AMBULATORY_CARE_PROVIDER_SITE_OTHER): Payer: PPO | Admitting: Internal Medicine

## 2016-10-10 ENCOUNTER — Encounter (INDEPENDENT_AMBULATORY_CARE_PROVIDER_SITE_OTHER): Payer: Self-pay | Admitting: Internal Medicine

## 2016-10-10 VITALS — BP 114/80 | HR 74 | Temp 97.5°F | Resp 18 | Ht 64.0 in | Wt 165.1 lb

## 2016-10-10 DIAGNOSIS — K2100 Gastro-esophageal reflux disease with esophagitis, without bleeding: Secondary | ICD-10-CM

## 2016-10-10 DIAGNOSIS — K580 Irritable bowel syndrome with diarrhea: Secondary | ICD-10-CM | POA: Diagnosis not present

## 2016-10-10 DIAGNOSIS — R195 Other fecal abnormalities: Secondary | ICD-10-CM | POA: Diagnosis not present

## 2016-10-10 MED ORDER — CHOLESTYRAMINE 4 G PO PACK: 4.0000 g | PACK | Freq: Every day | ORAL | 5 refills | Status: DC | PRN

## 2016-10-10 NOTE — Progress Notes (Signed)
Presenting complaint;  Follow-up for IBS GERD and iron deficiency anemia.  Subjective:  Patient is 82 year old Caucasian female who is here for scheduled visit. She was last seen in November 2017. She brings along a copy of for blood work that she had by Dr. Karie Kirks on 06/05/2016 which is reviewed under lab data. She is feeling better. On most days she has 1-3 formed stools. Every now and then she may have an episode of diarrhea when she also sees a speck of blood. Last episode occurred on 08/16/2016. She has kept a stool diary which was reviewed with the patient and she had 1-2 stools on most days. States she's been under a lot of stress because of her brother's illness but she decided to disengage somewhat because it was pulling her down. She states famotidine is controlling her heartburn. She denies dysphagia nausea or vomiting. She is trying to be more active. She goes to [facility] once or twice a week for pool therapy. She says she will need new prescription for cholestyramine if she is to continue it.   Current Medications: Outpatient Encounter Prescriptions as of 10/10/2016  Medication Sig  . ALPRAZolam (XANAX) 0.5 MG tablet Take 0.5 mg by mouth at bedtime as needed. Sleep  . atorvastatin (LIPITOR) 20 MG tablet Take 20 mg by mouth daily.  . Calcium Carbonate (CALCIUM 600 PO) Take by mouth daily.  . famotidine (PEPCID) 20 MG tablet Take 20 mg by mouth 2 (two) times daily.  Marland Kitchen loperamide (IMODIUM) 2 MG capsule Take 1 capsule (2 mg total) by mouth daily before breakfast. Patient states that she takes 3/4 daily.  Marland Kitchen OVER THE COUNTER MEDICATION Take by mouth daily. IBguard   . Pediatric Multivitamins-Iron (FLINTSTONES PLUS IRON) chewable tablet Chew 1 tablet by mouth every Monday, Wednesday, and Friday.  . vitamin B-12 (CYANOCOBALAMIN) 1000 MCG tablet Take 1,000 mcg by mouth daily.  . Vitamin D, Ergocalciferol, (DRISDOL) 50000 UNITS CAPS capsule Take 50,000 Units by mouth every 7 (seven) days. Takes  on Saturdays.  . [DISCONTINUED] Glucosamine-Chondroitin (COSAMIN DS) 500-400 MG CAPS Take 1 capsule by mouth 2 (two) times daily.   . [DISCONTINUED] Wheat Dextrin (BENEFIBER DRINK MIX) PACK Take 4 g by mouth at bedtime. (Patient not taking: Reported on 10/10/2016)   No facility-administered encounter medications on file as of 10/10/2016.      Objective: Blood pressure 114/80, pulse 74, temperature 97.5 F (36.4 C), temperature source Oral, resp. rate 18, height 5\' 4"  (1.626 m), weight 165 lb 1.6 oz (74.9 kg). Patient is alert and in no acute distress. Conjunctiva is pink. Sclera is nonicteric Oropharyngeal mucosa is normal. No neck masses or thyromegaly noted. Cardiac exam with regular rhythm normal S1 and S2. No murmur or gallop noted. Lungs are clear to auscultation. Abdomen is full but soft and nontender without organomegaly or masses. No LE edema or clubbing noted.  Labs/studies Results: Lab data from 06/05/2016  WBC 5.9 H&H 13.9 and 41.7 and platelet count 202K. MCV 84.4.  Serum iron 105 TIBC 301 and saturation 35% and serum ferritin 32. Serum B12 1303 and folate 15.4.   Stool is guaiac positive.    Assessment:  #1. Irritable bowel syndrome. She is doing well with therapy. We'll try to simplify her therapy so she can take to medicines rather than 3. #2. GERD. Symptoms are well controlled with H2B. #3. Heme positive stool. She brought Hemoccult card with her today and it is positive. She has history of iron deficiency anemia but her H&H  is now normal. Last colonoscopy was in November 2015 and I do not feel strongly that it needs to be repeated. If H&H drops will consider EGD and small bowel given capsule study. She will continue Flintstone with iron for now.   Plan:  Discontinue IBgard. Use cholestyramine daily but can titrate dose anywhere from 1-4 g each time. New prescription for cholestyramine sent to her pharmacy. Shouldn't will call if she has frank rectal bleeding  or melena. Use Dulcolax suppository or Fleet enema for constipation. Office visit in 6 months. She is to have CBC by Dr. Karie Kirks our next visit in 2 months.

## 2016-10-10 NOTE — Patient Instructions (Signed)
Call if you have frank rectal bleeding or melena. Can titrate Questran or cholestyramine dose as discussed. Can use Dulcolax suppository or Fleet enema if you become constipated.

## 2016-10-11 ENCOUNTER — Telehealth (INDEPENDENT_AMBULATORY_CARE_PROVIDER_SITE_OTHER): Payer: Self-pay | Admitting: *Deleted

## 2016-10-11 DIAGNOSIS — D509 Iron deficiency anemia, unspecified: Secondary | ICD-10-CM | POA: Diagnosis not present

## 2016-10-11 NOTE — Telephone Encounter (Signed)
   Diagnosis:    Result(s)   Card 1: Positive          Completed by: Tamy Kristyana Notte,LPN   HEMOCCULT SENSA DEVELOPER: APT#:[id number]   EXPIRATION DATE: 2020/05   HEMOCCULT SENSA CARD:  TGA#:[id number]   EXPIRATION DATE: 03/20   CARD CONTROL RESULTS:  POSITIVE: Positive  NEGATIVE: Negative    ADDITIONAL COMMENTS: Patient called and Message left with result. Forwarded to [place] .

## 2016-10-12 ENCOUNTER — Other Ambulatory Visit (INDEPENDENT_AMBULATORY_CARE_PROVIDER_SITE_OTHER): Payer: Self-pay | Admitting: Internal Medicine

## 2016-10-12 NOTE — Telephone Encounter (Signed)
Addressed at the time of office visit.

## 2016-10-13 ENCOUNTER — Telehealth (INDEPENDENT_AMBULATORY_CARE_PROVIDER_SITE_OTHER): Payer: Self-pay | Admitting: *Deleted

## 2016-10-13 NOTE — Telephone Encounter (Signed)
   Diagnosis:    Result(s)   Card 1: Positive          Completed by: Thomas Hoff ,LPN   HEMOCCULT SENSA DEVELOPER: WEX#:[id number]   EXPIRATION DATE: 2020-05   HEMOCCULT SENSA CARD:  CVE#:[id number]   EXPIRATION DATE: 05/20   CARD CONTROL RESULTS:  POSITIVE:Positive  NEGATIVE: Negative    ADDITIONAL COMMENTS: Patient brought it into office on 10/11/2016. Dr.Rehman was made aware and has talked with the patient .

## 2016-10-27 ENCOUNTER — Ambulatory Visit
Admission: RE | Admit: 2016-10-27 | Discharge: 2016-10-27 | Disposition: A | Discharge status: Home / Self Care | Payer: PPO | Source: Ambulatory Visit | Attending: Family Medicine | Admitting: Family Medicine

## 2016-10-27 DIAGNOSIS — Z1231 Encounter for screening mammogram for malignant neoplasm of breast: Secondary | ICD-10-CM | POA: Diagnosis not present

## 2016-11-01 ENCOUNTER — Encounter (INDEPENDENT_AMBULATORY_CARE_PROVIDER_SITE_OTHER): Payer: Self-pay

## 2016-12-01 DIAGNOSIS — K635 Polyp of colon: Secondary | ICD-10-CM | POA: Diagnosis not present

## 2016-12-01 DIAGNOSIS — K5730 Diverticulosis of large intestine without perforation or abscess without bleeding: Secondary | ICD-10-CM | POA: Diagnosis not present

## 2016-12-01 DIAGNOSIS — Z008 Encounter for other general examination: Secondary | ICD-10-CM | POA: Diagnosis not present

## 2016-12-01 DIAGNOSIS — R1032 Left lower quadrant pain: Secondary | ICD-10-CM | POA: Diagnosis not present

## 2016-12-01 DIAGNOSIS — D509 Iron deficiency anemia, unspecified: Secondary | ICD-10-CM | POA: Diagnosis not present

## 2016-12-20 ENCOUNTER — Other Ambulatory Visit (HOSPITAL_COMMUNITY): Payer: Self-pay | Admitting: Family Medicine

## 2016-12-20 DIAGNOSIS — R1032 Left lower quadrant pain: Secondary | ICD-10-CM

## 2016-12-27 ENCOUNTER — Ambulatory Visit (HOSPITAL_COMMUNITY)
Admission: RE | Admit: 2016-12-27 | Discharge: 2016-12-27 | Disposition: A | Discharge status: Home / Self Care | Payer: PPO | Source: Ambulatory Visit | Attending: Family Medicine | Admitting: Family Medicine

## 2016-12-28 ENCOUNTER — Ambulatory Visit (HOSPITAL_COMMUNITY)
Admission: RE | Admit: 2016-12-28 | Discharge: 2016-12-28 | Disposition: A | Discharge status: Home / Self Care | Payer: PPO | Source: Ambulatory Visit | Attending: Family Medicine | Admitting: Family Medicine

## 2016-12-28 DIAGNOSIS — I700 Atherosclerosis of aorta: Secondary | ICD-10-CM | POA: Diagnosis not present

## 2016-12-28 DIAGNOSIS — R935 Abnormal findings on diagnostic imaging of other abdominal regions, including retroperitoneum: Secondary | ICD-10-CM | POA: Diagnosis not present

## 2016-12-28 DIAGNOSIS — R1032 Left lower quadrant pain: Secondary | ICD-10-CM | POA: Diagnosis not present

## 2017-01-08 DIAGNOSIS — D225 Melanocytic nevi of trunk: Secondary | ICD-10-CM | POA: Diagnosis not present

## 2017-01-08 DIAGNOSIS — N390 Urinary tract infection, site not specified: Secondary | ICD-10-CM | POA: Diagnosis not present

## 2017-01-08 DIAGNOSIS — L648 Other androgenic alopecia: Secondary | ICD-10-CM | POA: Diagnosis not present

## 2017-01-08 DIAGNOSIS — R1030 Lower abdominal pain, unspecified: Secondary | ICD-10-CM | POA: Diagnosis not present

## 2017-01-08 DIAGNOSIS — L814 Other melanin hyperpigmentation: Secondary | ICD-10-CM | POA: Diagnosis not present

## 2017-01-08 DIAGNOSIS — D1801 Hemangioma of skin and subcutaneous tissue: Secondary | ICD-10-CM | POA: Diagnosis not present

## 2017-01-08 DIAGNOSIS — L821 Other seborrheic keratosis: Secondary | ICD-10-CM | POA: Diagnosis not present

## 2017-01-11 DIAGNOSIS — L648 Other androgenic alopecia: Secondary | ICD-10-CM | POA: Diagnosis not present

## 2017-02-01 DIAGNOSIS — H40013 Open angle with borderline findings, low risk, bilateral: Secondary | ICD-10-CM | POA: Diagnosis not present

## 2017-02-01 DIAGNOSIS — H35372 Puckering of macula, left eye: Secondary | ICD-10-CM | POA: Diagnosis not present

## 2017-02-01 DIAGNOSIS — H04123 Dry eye syndrome of bilateral lacrimal glands: Secondary | ICD-10-CM | POA: Diagnosis not present

## 2017-02-01 DIAGNOSIS — H353131 Nonexudative age-related macular degeneration, bilateral, early dry stage: Secondary | ICD-10-CM | POA: Diagnosis not present

## 2017-04-12 ENCOUNTER — Ambulatory Visit (INDEPENDENT_AMBULATORY_CARE_PROVIDER_SITE_OTHER): Payer: PPO | Admitting: Internal Medicine

## 2017-04-17 ENCOUNTER — Encounter (INDEPENDENT_AMBULATORY_CARE_PROVIDER_SITE_OTHER): Payer: Self-pay | Admitting: Internal Medicine

## 2017-04-17 ENCOUNTER — Ambulatory Visit (INDEPENDENT_AMBULATORY_CARE_PROVIDER_SITE_OTHER): Payer: PPO | Admitting: Internal Medicine

## 2017-04-17 VITALS — BP 128/74 | HR 72 | Temp 98.2°F | Resp 18 | Ht 64.0 in | Wt 164.3 lb

## 2017-04-17 DIAGNOSIS — K580 Irritable bowel syndrome with diarrhea: Secondary | ICD-10-CM

## 2017-04-17 NOTE — Patient Instructions (Addendum)
Can use Questran 1 to 2 g on as-needed basis;

## 2017-04-17 NOTE — Progress Notes (Signed)
Presenting complaint;  Follow-up for diarrhea/IBS.  Subjective:  This 82 year old Caucasian female who is here for scheduled visit.  She was last seen 6 months ago.  He has Stool diary for the last 2 months.  Review of diary reveals she is taking 2 mg of Imodium every morning.  She is having anywhere from 1-3 bowel movements per day.  She is skips 1 day/week.  She has had few loose stools.  When she has loose stools she noticed her stool to be yellow and it bothers her.  Only underwent one occasion she noted a tiny spot of blood with her bowel movement 3 weeks ago.  She has not had any accidents.  She is not taking Questran because it made her constipated.  She wonders if she can take low-dose.  She remains with good appetite.  She states she has been having intermittent LLQ abdominal pain with urgency.  Dr. Karie Kirks proceeded with unenhanced abdominal pelvic CT and it was unremarkable.  She has discovered that she has diarrhea and urgency when she is stressed out.  Lately she has been under a lot of stress on account for husband and brother's illness.  She has not lost any weight in the last 6 months.   Current Medications: Outpatient Encounter Medications as of 04/17/2017  Medication Sig  . ALPRAZolam (XANAX) 0.5 MG tablet Take 0.5 mg by mouth at bedtime as needed. Sleep  . atorvastatin (LIPITOR) 20 MG tablet Take 20 mg by mouth daily.  . Calcium Carbonate (CALCIUM 600 PO) Take by mouth daily.  . famotidine (PEPCID) 20 MG tablet Take 20 mg by mouth 2 (two) times daily.  Marland Kitchen loperamide (IMODIUM) 2 MG capsule Take 2 mg by mouth daily before breakfast.   . Pediatric Multivitamins-Iron (FLINTSTONES PLUS IRON) chewable tablet Chew 1 tablet by mouth every Monday, Wednesday, and Friday. (Patient taking differently: Chew 1 tablet by mouth every Monday, Wednesday, and Friday. PA)  . vitamin B-12 (CYANOCOBALAMIN) 1000 MCG tablet Take 1,000 mcg by mouth daily.  . Vitamin D, Ergocalciferol, (DRISDOL) 50000  UNITS CAPS capsule Take 50,000 Units by mouth every 7 (seven) days. Takes on Saturdays.  . [DISCONTINUED] cholestyramine (QUESTRAN) 4 g packet Take 1 packet (4 g total) by mouth daily as needed. (Patient not taking: Reported on 04/17/2017)   No facility-administered encounter medications on file as of 04/17/2017.      Objective: Blood pressure 128/74, pulse 72, temperature 98.2 F (36.8 C), temperature source Oral, resp. rate 18, height 5\' 4"  (1.626 m), weight 164 lb 4.8 oz (74.5 kg). Patient is alert and in no acute distress. Conjunctiva is pink. Sclera is nonicteric Oropharyngeal mucosa is normal. No neck masses or thyromegaly noted. Cardiac exam with regular rhythm normal S1 and S2. No murmur or gallop noted. Lungs are clear to auscultation. Abdomen on palpation it is soft and nontender without organomegaly or masses. No LE edema or clubbing noted.  Labs/studies Results:  , No pelvic CT films reviewed.  Study performed on 12/28/2016 without contrast.  No evidence of diverticulitis or wall thickening to small or large bowel.  No evidence of renal calculi.  Irregular mucosa at the dome of urinary bladder possibly due to poor distention.  Mild abdominal aortic atherosclerosis.  Assessment:  #1.  Double bowel syndrome with diarrhea.  She is doing very well with 2 mg of Imodium daily.  She has not had any episodes of constipation.  She is still having this per week of diarrhea when she has 1  or 2 loose stools.  If she can predict she can take 1-2 g of cholestyramine as well.   Plan:  Continue Imodium OTC 2 mg p.o. Daily. Take 1-2 g of cholestyramine on as-needed basis. Office visit in 6 months.

## 2017-05-02 DIAGNOSIS — N644 Mastodynia: Secondary | ICD-10-CM | POA: Diagnosis not present

## 2017-06-05 ENCOUNTER — Encounter: Payer: Self-pay | Admitting: Internal Medicine

## 2017-06-05 DIAGNOSIS — I10 Essential (primary) hypertension: Secondary | ICD-10-CM | POA: Diagnosis not present

## 2017-06-05 DIAGNOSIS — E782 Mixed hyperlipidemia: Secondary | ICD-10-CM | POA: Diagnosis not present

## 2017-06-05 DIAGNOSIS — F419 Anxiety disorder, unspecified: Secondary | ICD-10-CM | POA: Diagnosis not present

## 2017-06-05 DIAGNOSIS — K588 Other irritable bowel syndrome: Secondary | ICD-10-CM | POA: Diagnosis not present

## 2017-06-15 DIAGNOSIS — E782 Mixed hyperlipidemia: Secondary | ICD-10-CM | POA: Diagnosis not present

## 2017-06-15 DIAGNOSIS — I10 Essential (primary) hypertension: Secondary | ICD-10-CM | POA: Diagnosis not present

## 2017-06-15 DIAGNOSIS — E559 Vitamin D deficiency, unspecified: Secondary | ICD-10-CM | POA: Diagnosis not present

## 2017-06-15 DIAGNOSIS — F419 Anxiety disorder, unspecified: Secondary | ICD-10-CM | POA: Diagnosis not present

## 2017-09-20 ENCOUNTER — Other Ambulatory Visit: Payer: Self-pay | Admitting: Family Medicine

## 2017-09-20 DIAGNOSIS — Z1231 Encounter for screening mammogram for malignant neoplasm of breast: Secondary | ICD-10-CM

## 2017-10-23 ENCOUNTER — Encounter (INDEPENDENT_AMBULATORY_CARE_PROVIDER_SITE_OTHER): Payer: Self-pay | Admitting: Internal Medicine

## 2017-10-23 ENCOUNTER — Ambulatory Visit (INDEPENDENT_AMBULATORY_CARE_PROVIDER_SITE_OTHER): Payer: PPO | Admitting: Internal Medicine

## 2017-10-23 VITALS — BP 140/90 | HR 76 | Temp 98.9°F | Resp 18 | Ht 64.0 in | Wt 165.1 lb

## 2017-10-23 DIAGNOSIS — K580 Irritable bowel syndrome with diarrhea: Secondary | ICD-10-CM | POA: Diagnosis not present

## 2017-10-23 DIAGNOSIS — K2100 Gastro-esophageal reflux disease with esophagitis, without bleeding: Secondary | ICD-10-CM

## 2017-10-23 NOTE — Progress Notes (Addendum)
Presenting complaint;  Follow-up for IBS and GERD.  Subjective:  Patient is 83 year old Caucasian female who is here for scheduled visit.  She was last seen in November 2018.  She says she is doing quite well even though she is under a lot of stress on account of her husband's multiple illness and presently she is only provider for him.  She states her daughter will be coming home soon to help.  She is a Radio producer. Patient has kept a stool diary for this month.  Review of the study reveals that she had one formed stools on most days followed by 2 formed stools on few days and she had 3 stools daily for 4 days and she went 3 days without a bowel movement.  She is taking Imodium OTC on as-needed basis.  At times she takes to put on most days she takes 1.  She has taken cholestyramine sporadically. She reports seeing blood on the tissue on one occasion since her last visit. Her appetite is not good but she has not lost any weight since her last visit.  She states she sleeps on a couch so that she can help her husband if he needs at night.  She says PPI is working.  She did have 2 episodes of swallowing difficulty in the last 6 months.  Each time she got spontaneous early within short time.   Current Medications: Outpatient Encounter Medications as of 10/23/2017  Medication Sig  . ALPRAZolam (XANAX) 0.5 MG tablet Take 0.5 mg by mouth at bedtime as needed. Sleep  . atorvastatin (LIPITOR) 20 MG tablet Take 20 mg by mouth daily.  . Calcium Carbonate (CALCIUM 600 PO) Take by mouth daily.  . cholestyramine (QUESTRAN) 4 GM/DOSE powder Take 1 g by mouth as needed.  . famotidine (PEPCID) 20 MG tablet Take 20 mg by mouth 2 (two) times daily.  Marland Kitchen loperamide (IMODIUM) 2 MG capsule Take 2 mg by mouth daily before breakfast.   . Pediatric Multivitamins-Iron (FLINTSTONES PLUS IRON) chewable tablet Chew 1 tablet by mouth every Monday, Wednesday, and Friday. (Patient taking differently: Chew 1 tablet by mouth  every Monday, Wednesday, and Friday. PA)  . vitamin B-12 (CYANOCOBALAMIN) 1000 MCG tablet Take 1,000 mcg by mouth daily.  . Vitamin D, Ergocalciferol, (DRISDOL) 50000 UNITS CAPS capsule Take 50,000 Units by mouth every 7 (seven) days. Takes on Saturdays.   No facility-administered encounter medications on file as of 10/23/2017.      Objective: Blood pressure 140/90, pulse 76, temperature 98.9 F (37.2 C), temperature source Oral, resp. rate 18, height 5\' 4"  (1.626 m), weight 165 lb 1.6 oz (74.9 kg). Patient is alert and in no acute distress Conjunctiva is pink. Sclera is nonicteric Oropharyngeal mucosa is normal. No neck masses or thyromegaly noted. Cardiac exam with regular rhythm normal S1 and S2. No murmur or gallop noted. Lungs are clear to auscultation. Abdomen is full but soft and nontender without organomegaly or masses. No LE edema or clubbing noted.  Lab data from 06/15/2017  WBC 6.3 H&H 12.5 and 36.8 and platelet count 176K. Bilirubin 0.8, AP 77, AST 17, ALT 16 total protein 6.1 and albumin 3.9.   Assessment:  #1.  Irritable bowel syndrome.  She has had diarrhea.  She is doing quite well with low-dose loperamide and sporadic use of cholestyramine.  Therefore she will continue this therapy until it stops working.  #2.  Chronic GERD.  She has known moderate sized sliding hiatal hernia.  Heartburn is well controlled  with H2B but she experienced 2 episodes of dysphagia.  If she has another episode we will proceed with further testing with barium pill esophagogram.   Plan:  Patient will continue Imodium OTC 2 mg once or twice daily as needed and use cholestyramine as a back-up. Continue famotidine 20 mg p.o. twice daily. Patient will call office if she has another episode of solid food dysphagia in which case we will proceed with barium pill esophagogram. Office visit in 1 year.

## 2017-10-23 NOTE — Patient Instructions (Signed)
Call if you have another episode of swallowing difficulty.

## 2017-10-25 ENCOUNTER — Ambulatory Visit (INDEPENDENT_AMBULATORY_CARE_PROVIDER_SITE_OTHER): Payer: PPO | Admitting: Internal Medicine

## 2017-10-29 ENCOUNTER — Ambulatory Visit: Payer: PPO

## 2017-11-30 ENCOUNTER — Ambulatory Visit
Admission: RE | Admit: 2017-11-30 | Discharge: 2017-11-30 | Disposition: A | Discharge status: Home / Self Care | Payer: PPO | Source: Ambulatory Visit | Attending: Family Medicine | Admitting: Family Medicine

## 2017-11-30 DIAGNOSIS — Z1231 Encounter for screening mammogram for malignant neoplasm of breast: Secondary | ICD-10-CM | POA: Diagnosis not present

## 2017-12-01 ENCOUNTER — Other Ambulatory Visit (INDEPENDENT_AMBULATORY_CARE_PROVIDER_SITE_OTHER): Payer: Self-pay | Admitting: Internal Medicine

## 2017-12-12 DIAGNOSIS — N183 Chronic kidney disease, stage 3 (moderate): Secondary | ICD-10-CM | POA: Diagnosis not present

## 2017-12-12 DIAGNOSIS — E782 Mixed hyperlipidemia: Secondary | ICD-10-CM | POA: Diagnosis not present

## 2017-12-12 DIAGNOSIS — G64 Other disorders of peripheral nervous system: Secondary | ICD-10-CM | POA: Diagnosis not present

## 2017-12-12 DIAGNOSIS — I10 Essential (primary) hypertension: Secondary | ICD-10-CM | POA: Diagnosis not present

## 2017-12-13 DIAGNOSIS — E782 Mixed hyperlipidemia: Secondary | ICD-10-CM | POA: Diagnosis not present

## 2017-12-13 DIAGNOSIS — N183 Chronic kidney disease, stage 3 (moderate): Secondary | ICD-10-CM | POA: Diagnosis not present

## 2017-12-13 DIAGNOSIS — I10 Essential (primary) hypertension: Secondary | ICD-10-CM | POA: Diagnosis not present

## 2017-12-13 DIAGNOSIS — G64 Other disorders of peripheral nervous system: Secondary | ICD-10-CM | POA: Diagnosis not present

## 2017-12-31 ENCOUNTER — Ambulatory Visit (HOSPITAL_COMMUNITY)
Admission: RE | Admit: 2017-12-31 | Discharge: 2017-12-31 | Disposition: A | Discharge status: Home / Self Care | Payer: PPO | Source: Ambulatory Visit | Attending: Family Medicine | Admitting: Family Medicine

## 2017-12-31 ENCOUNTER — Other Ambulatory Visit (HOSPITAL_COMMUNITY): Payer: Self-pay | Admitting: Family Medicine

## 2017-12-31 DIAGNOSIS — M79672 Pain in left foot: Secondary | ICD-10-CM | POA: Diagnosis not present

## 2017-12-31 DIAGNOSIS — M79671 Pain in right foot: Secondary | ICD-10-CM | POA: Insufficient documentation

## 2017-12-31 DIAGNOSIS — M5185 Other intervertebral disc disorders, thoracolumbar region: Secondary | ICD-10-CM | POA: Insufficient documentation

## 2017-12-31 DIAGNOSIS — M545 Low back pain: Secondary | ICD-10-CM | POA: Diagnosis not present

## 2018-01-04 DIAGNOSIS — G64 Other disorders of peripheral nervous system: Secondary | ICD-10-CM | POA: Diagnosis not present

## 2018-01-08 DIAGNOSIS — L821 Other seborrheic keratosis: Secondary | ICD-10-CM | POA: Diagnosis not present

## 2018-01-08 DIAGNOSIS — L570 Actinic keratosis: Secondary | ICD-10-CM | POA: Diagnosis not present

## 2018-01-08 DIAGNOSIS — D229 Melanocytic nevi, unspecified: Secondary | ICD-10-CM | POA: Diagnosis not present

## 2018-01-08 DIAGNOSIS — D1801 Hemangioma of skin and subcutaneous tissue: Secondary | ICD-10-CM | POA: Diagnosis not present

## 2018-01-08 DIAGNOSIS — L814 Other melanin hyperpigmentation: Secondary | ICD-10-CM | POA: Diagnosis not present

## 2018-01-08 DIAGNOSIS — D485 Neoplasm of uncertain behavior of skin: Secondary | ICD-10-CM | POA: Diagnosis not present

## 2018-01-09 ENCOUNTER — Other Ambulatory Visit: Payer: Self-pay | Admitting: Nurse Practitioner

## 2018-01-09 DIAGNOSIS — B079 Viral wart, unspecified: Secondary | ICD-10-CM | POA: Diagnosis not present

## 2018-01-09 DIAGNOSIS — G629 Polyneuropathy, unspecified: Secondary | ICD-10-CM

## 2018-01-20 ENCOUNTER — Ambulatory Visit
Admission: RE | Admit: 2018-01-20 | Discharge: 2018-01-20 | Disposition: A | Discharge status: Home / Self Care | Payer: PPO | Source: Ambulatory Visit | Attending: Nurse Practitioner | Admitting: Nurse Practitioner

## 2018-01-20 DIAGNOSIS — G629 Polyneuropathy, unspecified: Secondary | ICD-10-CM

## 2018-01-20 DIAGNOSIS — M545 Low back pain: Secondary | ICD-10-CM | POA: Diagnosis not present

## 2018-01-23 DIAGNOSIS — G64 Other disorders of peripheral nervous system: Secondary | ICD-10-CM | POA: Diagnosis not present

## 2018-01-23 DIAGNOSIS — I8393 Asymptomatic varicose veins of bilateral lower extremities: Secondary | ICD-10-CM | POA: Diagnosis not present

## 2018-01-24 ENCOUNTER — Other Ambulatory Visit: Payer: Self-pay | Admitting: Nurse Practitioner

## 2018-01-24 DIAGNOSIS — M5126 Other intervertebral disc displacement, lumbar region: Secondary | ICD-10-CM

## 2018-02-07 DIAGNOSIS — H40013 Open angle with borderline findings, low risk, bilateral: Secondary | ICD-10-CM | POA: Diagnosis not present

## 2018-02-07 DIAGNOSIS — H04123 Dry eye syndrome of bilateral lacrimal glands: Secondary | ICD-10-CM | POA: Diagnosis not present

## 2018-02-07 DIAGNOSIS — Z961 Presence of intraocular lens: Secondary | ICD-10-CM | POA: Diagnosis not present

## 2018-02-13 ENCOUNTER — Other Ambulatory Visit: Payer: PPO

## 2018-02-19 DIAGNOSIS — L989 Disorder of the skin and subcutaneous tissue, unspecified: Secondary | ICD-10-CM | POA: Diagnosis not present

## 2018-02-19 DIAGNOSIS — W57XXXA Bitten or stung by nonvenomous insect and other nonvenomous arthropods, initial encounter: Secondary | ICD-10-CM | POA: Diagnosis not present

## 2018-03-04 DIAGNOSIS — G64 Other disorders of peripheral nervous system: Secondary | ICD-10-CM | POA: Diagnosis not present

## 2018-03-04 DIAGNOSIS — Z23 Encounter for immunization: Secondary | ICD-10-CM | POA: Diagnosis not present

## 2018-03-04 DIAGNOSIS — W57XXXD Bitten or stung by nonvenomous insect and other nonvenomous arthropods, subsequent encounter: Secondary | ICD-10-CM | POA: Diagnosis not present

## 2018-03-04 DIAGNOSIS — M8580 Other specified disorders of bone density and structure, unspecified site: Secondary | ICD-10-CM | POA: Diagnosis not present

## 2018-03-05 ENCOUNTER — Other Ambulatory Visit (HOSPITAL_COMMUNITY): Payer: Self-pay | Admitting: Family Medicine

## 2018-03-05 DIAGNOSIS — Z780 Asymptomatic menopausal state: Secondary | ICD-10-CM

## 2018-03-05 DIAGNOSIS — M8580 Other specified disorders of bone density and structure, unspecified site: Secondary | ICD-10-CM

## 2018-03-11 ENCOUNTER — Ambulatory Visit (HOSPITAL_COMMUNITY)
Admission: RE | Admit: 2018-03-11 | Discharge: 2018-03-11 | Disposition: A | Discharge status: Home / Self Care | Payer: PPO | Source: Ambulatory Visit | Attending: Family Medicine | Admitting: Family Medicine

## 2018-03-11 DIAGNOSIS — M8580 Other specified disorders of bone density and structure, unspecified site: Secondary | ICD-10-CM | POA: Diagnosis present

## 2018-03-11 DIAGNOSIS — M810 Age-related osteoporosis without current pathological fracture: Secondary | ICD-10-CM | POA: Diagnosis not present

## 2018-03-11 DIAGNOSIS — Z780 Asymptomatic menopausal state: Secondary | ICD-10-CM | POA: Diagnosis not present

## 2018-03-11 DIAGNOSIS — M85851 Other specified disorders of bone density and structure, right thigh: Secondary | ICD-10-CM | POA: Insufficient documentation

## 2018-03-11 DIAGNOSIS — M8589 Other specified disorders of bone density and structure, multiple sites: Secondary | ICD-10-CM | POA: Diagnosis not present

## 2018-04-01 ENCOUNTER — Telehealth (INDEPENDENT_AMBULATORY_CARE_PROVIDER_SITE_OTHER): Payer: Self-pay | Admitting: *Deleted

## 2018-04-01 NOTE — Telephone Encounter (Signed)
Ms. Pronovost called and states that she has lost 10 lbs. She is not sure if it is stress , due to her husband's health/ Since seen in our office she states that she  Had a tick bite , she had MRI that stated that she had Neuropathy in her feet. She is on Gabapentin and her feet are better. Bone destiny was not good and she has appointment for that tomorrow to discuss the results. While on the antibiotic she did have loose stools . Which are better. She has taken the Imodium and the Questran as told.  She is having rectal pain, not sure if it is hemorrhoids. She has seen mucous. She may have seen a little blood today. The last 23 days have been good per the patient. She weighed is 155 lbs. At the time of her OC 10/23/17 she weighed 165 lb 1.6 oz. Will discuss with Dr.Rehman and call the patient back.

## 2018-04-02 ENCOUNTER — Other Ambulatory Visit (INDEPENDENT_AMBULATORY_CARE_PROVIDER_SITE_OTHER): Payer: Self-pay | Admitting: *Deleted

## 2018-04-02 DIAGNOSIS — M810 Age-related osteoporosis without current pathological fracture: Secondary | ICD-10-CM | POA: Diagnosis not present

## 2018-04-02 DIAGNOSIS — K921 Melena: Secondary | ICD-10-CM

## 2018-04-02 DIAGNOSIS — R634 Abnormal weight loss: Secondary | ICD-10-CM

## 2018-04-02 DIAGNOSIS — K6289 Other specified diseases of anus and rectum: Secondary | ICD-10-CM

## 2018-04-02 NOTE — Telephone Encounter (Signed)
Per Dr.Rehman- Get a CBC , Sedrate. Have the patient to keep a food and stool diary.OV in 2 weeks.

## 2018-04-02 NOTE — Progress Notes (Signed)
Patient was called and made aware. 

## 2018-04-03 DIAGNOSIS — R634 Abnormal weight loss: Secondary | ICD-10-CM | POA: Diagnosis not present

## 2018-04-03 DIAGNOSIS — K6289 Other specified diseases of anus and rectum: Secondary | ICD-10-CM | POA: Diagnosis not present

## 2018-04-03 DIAGNOSIS — K921 Melena: Secondary | ICD-10-CM | POA: Diagnosis not present

## 2018-04-03 LAB — CBC
HEMATOCRIT: 42 % (ref 35.0–45.0)
HEMOGLOBIN: 14 g/dL (ref 11.7–15.5)
MCH: 28.6 pg (ref 27.0–33.0)
MCHC: 33.3 g/dL (ref 32.0–36.0)
MCV: 85.9 fL (ref 80.0–100.0)
MPV: 11.6 fL (ref 7.5–12.5)
Platelets: 199 10*3/uL (ref 140–400)
RBC: 4.89 10*6/uL (ref 3.80–5.10)
RDW: 12.9 % (ref 11.0–15.0)
WBC: 7.1 10*3/uL (ref 3.8–10.8)

## 2018-04-03 LAB — SEDIMENTATION RATE: Sed Rate: 3 mm/h (ref 0–30)

## 2018-04-16 ENCOUNTER — Telehealth (INDEPENDENT_AMBULATORY_CARE_PROVIDER_SITE_OTHER): Payer: Self-pay | Admitting: *Deleted

## 2018-04-16 ENCOUNTER — Ambulatory Visit (INDEPENDENT_AMBULATORY_CARE_PROVIDER_SITE_OTHER): Payer: PPO | Admitting: Internal Medicine

## 2018-04-16 ENCOUNTER — Encounter (INDEPENDENT_AMBULATORY_CARE_PROVIDER_SITE_OTHER): Payer: Self-pay | Admitting: Internal Medicine

## 2018-04-16 VITALS — BP 140/90 | HR 70 | Temp 98.1°F | Resp 18 | Ht 64.0 in | Wt 159.2 lb

## 2018-04-16 DIAGNOSIS — K580 Irritable bowel syndrome with diarrhea: Secondary | ICD-10-CM

## 2018-04-16 DIAGNOSIS — R634 Abnormal weight loss: Secondary | ICD-10-CM

## 2018-04-16 DIAGNOSIS — K219 Gastro-esophageal reflux disease without esophagitis: Secondary | ICD-10-CM

## 2018-04-16 NOTE — Patient Instructions (Signed)
Weight check in 2 months 

## 2018-04-16 NOTE — Progress Notes (Signed)
Presenting complaint;  Follow-up for diarrhea/IBS GERD and weight loss.  Database and subjective:  Patient is 83 year old Caucasian female who has a history of IBS with diarrhea and GERD and was doing well when she was seen on 10/23/2017. Patient called about 2 weeks ago stating that she had lost 10 pounds.  She did not have any other symptoms.  She was advised to do Hemoccult, CBC and sed rate prior to this visit.  Patient states she feels much better.  She states since she talk with Ms. Thomas Hoff at her office 2 weeks ago she has been trying to eat more.  She has gained 5 pounds back.  She states her husband was hospitalized twice for pneumonia and now he has had pain center.  She states while he was at home she would sleep in a recliner right next to him so so that she could help him.  She states her daughter who who has just retired is helping a great deal.  She lives in [place].  Patient states 1 of her nieces has irritable bowel syndrome.  She states she is very close to her niece and has been discussing all her issues with her.  She says previously she was keeping everything to herself and she realizes it was very difficult for her. She feels heartburn is well controlled with famotidine.  She is not having any side effects.   Current Medications: Outpatient Encounter Medications as of 04/16/2018  Medication Sig  . alendronate (FOSAMAX) 70 MG tablet Take 70 mg by mouth once a week. Take with a full glass of water on an empty stomach.  . ALPRAZolam (XANAX) 0.5 MG tablet Take 0.5 mg by mouth at bedtime as needed. Sleep  . atorvastatin (LIPITOR) 20 MG tablet Take 20 mg by mouth daily.  . Calcium Carbonate (CALCIUM 600 PO) Take by mouth daily.  . cholestyramine (QUESTRAN) 4 GM/DOSE powder Take 1 g by mouth as needed.  . famotidine (PEPCID) 20 MG tablet Take 20 mg by mouth 2 (two) times daily.  Marland Kitchen loperamide (IMODIUM) 2 MG capsule Take 2 mg by mouth daily.   . Pediatric Multivitamins-Iron  (FLINTSTONES PLUS IRON) chewable tablet Chew 1 tablet by mouth every Monday, Wednesday, and Friday. (Patient taking differently: Chew 1 tablet by mouth every Monday, Wednesday, and Friday. PA)  . vitamin B-12 (CYANOCOBALAMIN) 1000 MCG tablet Take 1,000 mcg by mouth daily.  . Vitamin D, Ergocalciferol, (DRISDOL) 50000 UNITS CAPS capsule Take 50,000 Units by mouth every 7 (seven) days. Takes on Saturdays.  . [DISCONTINUED] cholestyramine (QUESTRAN) 4 g packet MIX 1 PACKET INTO JUICE OR WATER DAILY.   No facility-administered encounter medications on file as of 04/16/2018.      Objective: Blood pressure 140/90, pulse 70, temperature 98.1 F (36.7 C), temperature source Oral, resp. rate 18, height 5\' 4"  (1.626 m), weight 159 lb 3.2 oz (72.2 kg). Patient is alert and in no acute distress. Conjunctiva is pink. Sclera is nonicteric Oropharyngeal mucosa is normal. No neck masses or thyromegaly noted. Cardiac exam with regular rhythm normal S1 and S2. No murmur or gallop noted. Lungs are clear to auscultation. Abdomen is full.  Bowel sounds are normal.  On palpation abdomen is soft and nontender with organomegaly or masses. No LE edema or clubbing noted.  Labs/studies Results:  Hemoccult is negative.  Lab data from 04/03/2018 WBC 7.1, H&H 14 and 42.0 and platelet count 199K.  Sed rate 3.   Assessment:  #1.  Weight loss.  Patient  lost 10 pounds over the last few weeks but she has gained 5 pounds back which is very reassuring.  Weight loss appears to be due to diminished calorie intake resulting from stress due to her husband's critical illness who is in a nursing home.  #2.  Chronic diarrhea felt to be due to IBS.  She has Stool diary and she is having formed stools on most days.  She is not having any side effects or constipation with daily single dose Imodium and as needed cholestyramine.  #3.  GERD.  She is doing well with therapy.   Plan:  Weight check in 2 months. Patient will  continue Imodium OTC 2 mg every morning. Cholestyramine 2 to 4 g daily as needed. Continue famotidine 20 mg p.o. twice daily. Office visit in 6 months.

## 2018-04-16 NOTE — Telephone Encounter (Signed)
   Diagnosis:    Result(s)   Card 1:Negative:           Completed by: Thomas Hoff ,LPN   HEMOCCULT SENSA DEVELOPER: LOT#:  [id number] EXPIRATION DATE:  2021-11  HEMOCCULT SENSA CARD:  LOT#:  [id number] 2L EXPIRATION DATE: 05/21   CARD CONTROL RESULTS:  POSITIVE: Positive NEGATIVE: Negative    ADDITIONAL COMMENTS: Dr.Rehman made aware.

## 2018-04-30 ENCOUNTER — Ambulatory Visit (INDEPENDENT_AMBULATORY_CARE_PROVIDER_SITE_OTHER): Payer: PPO | Admitting: Internal Medicine

## 2018-05-01 DIAGNOSIS — S61511A Laceration without foreign body of right wrist, initial encounter: Secondary | ICD-10-CM | POA: Diagnosis not present

## 2018-05-01 DIAGNOSIS — Z23 Encounter for immunization: Secondary | ICD-10-CM | POA: Diagnosis not present

## 2018-05-01 DIAGNOSIS — T148XXA Other injury of unspecified body region, initial encounter: Secondary | ICD-10-CM | POA: Diagnosis not present

## 2018-06-04 DIAGNOSIS — Z9189 Other specified personal risk factors, not elsewhere classified: Secondary | ICD-10-CM | POA: Diagnosis not present

## 2018-06-04 DIAGNOSIS — I10 Essential (primary) hypertension: Secondary | ICD-10-CM | POA: Diagnosis not present

## 2018-06-04 DIAGNOSIS — G4700 Insomnia, unspecified: Secondary | ICD-10-CM | POA: Diagnosis not present

## 2018-06-04 DIAGNOSIS — E782 Mixed hyperlipidemia: Secondary | ICD-10-CM | POA: Diagnosis not present

## 2018-06-10 DIAGNOSIS — E559 Vitamin D deficiency, unspecified: Secondary | ICD-10-CM | POA: Diagnosis not present

## 2018-06-10 DIAGNOSIS — N183 Chronic kidney disease, stage 3 (moderate): Secondary | ICD-10-CM | POA: Diagnosis not present

## 2018-06-10 DIAGNOSIS — E782 Mixed hyperlipidemia: Secondary | ICD-10-CM | POA: Diagnosis not present

## 2018-06-10 DIAGNOSIS — I10 Essential (primary) hypertension: Secondary | ICD-10-CM | POA: Diagnosis not present

## 2018-06-28 ENCOUNTER — Telehealth (INDEPENDENT_AMBULATORY_CARE_PROVIDER_SITE_OTHER): Payer: Self-pay | Admitting: *Deleted

## 2018-06-28 NOTE — Telephone Encounter (Signed)
Stephanie Wood presented to the office today for a weight check. On 04/16/18 she weighed 159 lbs 3.2 oz , today she weighed 158 lbs.6 oz. She complains of LLQ pain. She says that her BM's are still yellow in color. In the last 12 days they have been 2 regular BM's then 1 loose BM , she says this has been a pattern.  Her next OV is in May 2020.

## 2018-07-06 ENCOUNTER — Ambulatory Visit
Admission: EM | Admit: 2018-07-06 | Discharge: 2018-07-06 | Disposition: A | Discharge status: Home / Self Care | Payer: PPO | Attending: Family Medicine | Admitting: Family Medicine

## 2018-07-06 ENCOUNTER — Other Ambulatory Visit: Payer: Self-pay

## 2018-07-06 DIAGNOSIS — Z711 Person with feared health complaint in whom no diagnosis is made: Secondary | ICD-10-CM | POA: Diagnosis not present

## 2018-07-06 DIAGNOSIS — J029 Acute pharyngitis, unspecified: Secondary | ICD-10-CM | POA: Diagnosis not present

## 2018-07-06 LAB — POCT INFLUENZA A/B
INFLUENZA B, POC: NEGATIVE
Influenza A, POC: NEGATIVE

## 2018-07-06 NOTE — ED Triage Notes (Signed)
Per pt her husband has been sick for a week and was diagnosed with the flu yesterday and wants to checked for illness.. NO signs, No fever, no chills.

## 2018-07-06 NOTE — Discharge Instructions (Addendum)
Your flu test was negative if you start developing severe symptoms please come back to be reevaluated Take precautions to make sure that you are washing your hands and staying away from people that are sick.

## 2018-07-06 NOTE — ED Provider Notes (Signed)
[name]    CSN: [id number] Arrival date & time: 07/06/18  1228     History   Chief Complaint No chief complaint on file.   HPI Stephanie Wood is a 84 y.o. female.   Patient is an 84 year old female that presents today worried but well.  She is here requesting flu swab.  Her husband is very sick in the hospital and was just diagnosed with flu.  She reports taking care of him all week not knowing he had flu.  She has had mild scratchy throat but otherwise she has no cough, congestion, fever, myalgias, nausea, vomiting, diarrhea.  She does not smoke.  She is otherwise healthy.        Past Medical History:  Diagnosis Date  . Anemia   . Colonic adenoma     Patient Active Problem List   Diagnosis Date Noted  . History of colonic polyps 03/19/2014  . Osteoporosis, unspecified 07/29/2013  . GERD (gastroesophageal reflux disease) 07/29/2013  . Other and unspecified hyperlipidemia 07/29/2013  . Anxiety state, unspecified 07/29/2013    Past Surgical History:  Procedure Laterality Date  . COLONOSCOPY N/A 04/15/2014   Procedure: COLONOSCOPY;  Surgeon: Rogene Houston, MD;  Location: [facility];  Service: Endoscopy;  Laterality: N/A;  730  . ESOPHAGOGASTRODUODENOSCOPY  09/20/2011   Procedure: ESOPHAGOGASTRODUODENOSCOPY (EGD);  Surgeon: Rogene Houston, MD;  Location: [facility];  Service: Endoscopy;  Laterality: N/A;  215  . EYE SURGERY     bil catatract surgery    OB History   No obstetric history on file.      Home Medications    Prior to Admission medications   Medication Sig Start Date End Date Taking? Authorizing Provider  alendronate (FOSAMAX) 70 MG tablet Take 70 mg by mouth once a week. Take with a full glass of water on an empty stomach.    [provider]  ALPRAZolam Duanne Moron) 0.5 MG tablet Take 0.5 mg by mouth at bedtime as needed. Sleep    [provider]  atorvastatin (LIPITOR) 20 MG tablet Take 20 mg by mouth daily.     [provider]  Calcium Carbonate (CALCIUM 600 PO) Take by mouth daily.    [provider]  cholestyramine Lucrezia Starch) 4 GM/DOSE powder Take 1 g by mouth as needed.    [provider]  famotidine (PEPCID) 20 MG tablet Take 20 mg by mouth 2 (two) times daily.    [provider]  loperamide (IMODIUM) 2 MG capsule Take 2 mg by mouth daily.  10/23/17   Rogene Houston, MD  Pediatric Multivitamins-Iron (FLINTSTONES PLUS IRON) chewable tablet Chew 1 tablet by mouth every Monday, Wednesday, and Friday. Patient taking differently: Chew 1 tablet by mouth every Monday, Wednesday, and Friday. PA 04/12/16   Rogene Houston, MD  vitamin B-12 (CYANOCOBALAMIN) 1000 MCG tablet Take 1,000 mcg by mouth daily.    [provider]  Vitamin D, Ergocalciferol, (DRISDOL) 50000 UNITS CAPS capsule Take 50,000 Units by mouth every 7 (seven) days. Takes on Saturdays.    [provider]    Family History No family history on file.  Social History Social History   Tobacco Use  . Smoking status: Never Smoker  . Smokeless tobacco: Never Used  Substance Use Topics  . Alcohol use: No    Alcohol/week: 0.0 standard drinks  . Drug use: No     Allergies   Patient has no known allergies.   Review of  Systems Review of Systems  HENT: Negative for congestion, dental problem, drooling, ear discharge, ear pain, facial swelling, hearing loss, mouth sores, nosebleeds, postnasal drip, rhinorrhea, sinus pressure, sinus pain, sneezing, sore throat, tinnitus, trouble swallowing and voice change.   Respiratory: Negative for cough, chest tightness and shortness of breath.   Gastrointestinal: Negative for diarrhea, nausea and vomiting.  Musculoskeletal: Negative for myalgias.  Skin: Negative for rash.     Physical Exam Triage Vital Signs ED Triage Vitals  Enc Vitals Group     BP 07/06/18 1253 134/72     Pulse Rate 07/06/18 1253 67     Resp 07/06/18 1253 16     Temp  07/06/18 1253 98 F (36.7 C)     Temp Source 07/06/18 1253 Oral     SpO2 07/06/18 1253 97 %     Weight 07/06/18 1254 155 lb (70.3 kg)     Height 07/06/18 1254 5\' 4"  (1.626 m)     Head Circumference --      Peak Flow --      Pain Score 07/06/18 1253 0     Pain Loc --      Pain Edu? --      Excl. in [place]? --    No data found.  Updated Vital Signs BP 134/72 (BP Location: Left Arm)   Pulse 67   Temp 98 F (36.7 C) (Oral)   Resp 16   Ht 5\' 4"  (1.626 m)   Wt 155 lb (70.3 kg)   SpO2 97%   BMI 26.61 kg/m   Visual Acuity Right Eye Distance:   Left Eye Distance:   Bilateral Distance:    Right Eye Near:   Left Eye Near:    Bilateral Near:     Physical Exam Vitals signs and nursing note reviewed.  Constitutional:      General: She is not in acute distress.    Appearance: She is well-developed.  HENT:     Head: Normocephalic and atraumatic.  Eyes:     Conjunctiva/sclera: Conjunctivae normal.  Neck:     Musculoskeletal: Neck supple.  Cardiovascular:     Rate and Rhythm: Normal rate and regular rhythm.     Heart sounds: No murmur.  Pulmonary:     Effort: Pulmonary effort is normal. No respiratory distress.     Breath sounds: Normal breath sounds.  Abdominal:     Palpations: Abdomen is soft.     Tenderness: There is no abdominal tenderness.  Musculoskeletal: Normal range of motion.  Skin:    General: Skin is warm and dry.  Neurological:     Mental Status: She is alert.  Psychiatric:        Mood and Affect: Mood normal.      UC Treatments / Results  Labs (all labs ordered are listed, but only abnormal results are displayed) Labs Reviewed  POCT INFLUENZA A/B    EKG None  Radiology No results found.  Procedures Procedures (including critical care time)  Medications Ordered in UC Medications - No data to display  Initial Impression / Assessment and Plan / UC Course  I have reviewed the triage vital signs and the nursing notes.  Pertinent labs &  imaging results that were available during my care of the patient were reviewed by me and considered in my medical decision making (see chart for details).     Exam normal Patient here worried and would like to be swabbed for flu Flu test negative Explained precautions to take  to prevent flu Told her that if she starts developing any severe symptoms to call back and let us know Final Clinical Impressions(s) / UC Diagnoses   Final diagnoses:  Worried well     Discharge Instructions     Your flu test was negative if you start developing severe symptoms please come back to be reevaluated Take precautions to make sure that you are washing your hands and staying away from people that are sick.    ED Prescriptions    None     Controlled Substance Prescriptions [place] Controlled Substance Registry consulted? Not Applicable   Orvan July, NP 07/06/18 1400

## 2018-07-08 NOTE — Telephone Encounter (Signed)
Patient needs office visit this month with me.

## 2018-07-19 ENCOUNTER — Ambulatory Visit
Admission: EM | Admit: 2018-07-19 | Discharge: 2018-07-19 | Disposition: A | Discharge status: Home / Self Care | Payer: PPO | Attending: Family Medicine | Admitting: Family Medicine

## 2018-07-19 DIAGNOSIS — Z7689 Persons encountering health services in other specified circumstances: Secondary | ICD-10-CM | POA: Diagnosis not present

## 2018-07-19 DIAGNOSIS — R195 Other fecal abnormalities: Secondary | ICD-10-CM

## 2018-07-19 NOTE — Discharge Instructions (Addendum)
Dark stool likely from iron We are checking your hemoglobin to make sure this is stable Follow up with gastroenterology as planned Monitor bowel movements over the weekend Please go to emergency department if developing frequent loose dark stools, abdominal pain, lightheadedness dizziness

## 2018-07-19 NOTE — ED Triage Notes (Signed)
Pt states had a dark stool this am and concerned

## 2018-07-19 NOTE — ED Provider Notes (Signed)
[name]    CSN: [id number] Arrival date & time: 07/19/18  1111     History   Chief Complaint Chief Complaint  Patient presents with  . Rectal Bleeding    HPI Stephanie Wood is a 84 y.o. female history of colon polyps, osteoporosis, GERD, IBS presenting today for evaluation of dark stools.  Patient states that she had a black stool this morning.  Stool was normal consistency and solid.  Denies loose bowels.  Denies abdominal pain, occasionally having some slight discomfort in her left lower quadrant but this is off-and-on and very mild.  Denies any issues with urination.  Denies any rectal pain or rectal bleeding.  Does admit to a history of hemorrhoids, but has not had issues with this recently.  Notes that she has been treated for IBS which is ongoing.  Denies any nausea or vomiting.  Has had decreased oral intake of recently mainly due to stress related to her husband's health issues.  She does note that she took a Flintstone vitamin with iron last night.  HPI  Past Medical History:  Diagnosis Date  . Anemia   . Colonic adenoma     Patient Active Problem List   Diagnosis Date Noted  . History of colonic polyps 03/19/2014  . Osteoporosis, unspecified 07/29/2013  . GERD (gastroesophageal reflux disease) 07/29/2013  . Other and unspecified hyperlipidemia 07/29/2013  . Anxiety state, unspecified 07/29/2013    Past Surgical History:  Procedure Laterality Date  . COLONOSCOPY N/A 04/15/2014   Procedure: COLONOSCOPY;  Surgeon: Rogene Houston, MD;  Location: [facility];  Service: Endoscopy;  Laterality: N/A;  730  . ESOPHAGOGASTRODUODENOSCOPY  09/20/2011   Procedure: ESOPHAGOGASTRODUODENOSCOPY (EGD);  Surgeon: Rogene Houston, MD;  Location: [facility];  Service: Endoscopy;  Laterality: N/A;  215  . EYE SURGERY     bil catatract surgery    OB History   No obstetric history on file.      Home Medications    Prior to Admission medications     Medication Sig Start Date End Date Taking? Authorizing Provider  alendronate (FOSAMAX) 70 MG tablet Take 70 mg by mouth once a week. Take with a full glass of water on an empty stomach.    [provider]  ALPRAZolam Duanne Moron) 0.5 MG tablet Take 0.5 mg by mouth at bedtime as needed. Sleep    [provider]  atorvastatin (LIPITOR) 20 MG tablet Take 20 mg by mouth daily.    [provider]  Calcium Carbonate (CALCIUM 600 PO) Take by mouth daily.    [provider]  cholestyramine Lucrezia Starch) 4 GM/DOSE powder Take 1 g by mouth as needed.    [provider]  famotidine (PEPCID) 20 MG tablet Take 20 mg by mouth 2 (two) times daily.    [provider]  loperamide (IMODIUM) 2 MG capsule Take 2 mg by mouth daily.  10/23/17   Rogene Houston, MD  Pediatric Multivitamins-Iron (FLINTSTONES PLUS IRON) chewable tablet Chew 1 tablet by mouth every Monday, Wednesday, and Friday. Patient taking differently: Chew 1 tablet by mouth every Monday, Wednesday, and Friday. PA 04/12/16   Rogene Houston, MD  vitamin B-12 (CYANOCOBALAMIN) 1000 MCG tablet Take 1,000 mcg by mouth daily.    [provider]  Vitamin D, Ergocalciferol, (DRISDOL) 50000 UNITS CAPS capsule Take 50,000 Units by mouth every 7 (seven) days. Takes on Saturdays.    [provider]    Family History No  family history on file.  Social History Social History   Tobacco Use  . Smoking status: Never Smoker  . Smokeless tobacco: Never Used  Substance Use Topics  . Alcohol use: No    Alcohol/week: 0.0 standard drinks  . Drug use: No     Allergies   Patient has no known allergies.   Review of Systems Review of Systems  Constitutional: Negative for activity change, appetite change and fever.  Respiratory: Negative for shortness of breath.   Cardiovascular: Negative for chest pain.  Gastrointestinal: Negative for abdominal pain, blood in stool, diarrhea, nausea and  vomiting.  Genitourinary: Negative for dysuria, flank pain, hematuria, menstrual problem, vaginal bleeding, vaginal discharge and vaginal pain.  Musculoskeletal: Negative for back pain.  Skin: Negative for rash.  Neurological: Negative for dizziness, light-headedness and headaches.     Physical Exam Triage Vital Signs ED Triage Vitals  Enc Vitals Group     BP 07/19/18 1125 (!) 145/89     Pulse Rate 07/19/18 1125 66     Resp 07/19/18 1125 18     Temp 07/19/18 1125 98 F (36.7 C)     Temp Source 07/19/18 1125 Oral     SpO2 07/19/18 1125 96 %     Weight --      Height --      Head Circumference --      Peak Flow --      Pain Score 07/19/18 1126 0     Pain Loc --      Pain Edu? --      Excl. in [place]? --    No data found.  Updated Vital Signs BP (!) 145/89 (BP Location: Left Arm)   Pulse 66   Temp 98 F (36.7 C) (Oral)   Resp 18   SpO2 96%   Visual Acuity Right Eye Distance:   Left Eye Distance:   Bilateral Distance:    Right Eye Near:   Left Eye Near:    Bilateral Near:     Physical Exam Vitals signs and nursing note reviewed.  Constitutional:      Appearance: She is well-developed.     Comments: No acute distress  HENT:     Head: Normocephalic and atraumatic.     Nose: Nose normal.  Eyes:     Conjunctiva/sclera: Conjunctivae normal.  Neck:     Musculoskeletal: Neck supple.  Cardiovascular:     Rate and Rhythm: Normal rate.  Pulmonary:     Effort: Pulmonary effort is normal. No respiratory distress.     Comments: Breathing comfortably at rest, CTABL, no wheezing, rales or other adventitious sounds auscultated Abdominal:     General: There is no distension.     Comments: Abdomen soft, nondistended, mild tenderness to left lower quadrant, negative rebound, negative Rovsing, patient moving around in room and changing position without worsening of pain  Genitourinary:    Comments: External rectum normal, no signs of hemorrhoids or bleeding, digital rectal  exam with no palpation of masses or abnormalities, no significant stool in the rectal vault Musculoskeletal: Normal range of motion.  Skin:    General: Skin is warm and dry.  Neurological:     Mental Status: She is alert and oriented to person, place, and time.      UC Treatments / Results  Labs (all labs ordered are listed, but only abnormal results are displayed) Labs Reviewed  CBC  Hemoccult negative  EKG None  Radiology No results found.  Procedures Procedures (including  critical care time)  Medications Ordered in UC Medications - No data to display  Initial Impression / Assessment and Plan / UC Course  I have reviewed the triage vital signs and the nursing notes.  Pertinent labs & imaging results that were available during my care of the patient were reviewed by me and considered in my medical decision making (see chart for details).     Hemoccult negative, 1 episode of dark formed stool after taking iron tablet, likely more related to iron, but will check CBC given patient's history of polyps to ensure she has not had several losses of blood in bowels.  We will have her follow-up with gastroenterology as she already has planned on Monday.  Continue to monitor symptoms for return to normal bowel movements throughout the weekend.Discussed strict return precautions. Patient verbalized understanding and is agreeable with plan.  Final Clinical Impressions(s) / UC Diagnoses   Final diagnoses:  Dark stools     Discharge Instructions     Dark stool likely from iron We are checking your hemoglobin to make sure this is stable Follow up with gastroenterology as planned Monitor bowel movements over the weekend Please go to emergency department if developing frequent loose dark stools, abdominal pain, lightheadedness dizziness   ED Prescriptions    None     Controlled Substance Prescriptions [place] Controlled Substance Registry consulted? Not Applicable   Janith Lima, [place] 07/19/18 1217

## 2018-07-20 LAB — CBC
HEMATOCRIT: 42.7 % (ref 34.0–46.6)
Hemoglobin: 14 g/dL (ref 11.1–15.9)
MCH: 27.7 pg (ref 26.6–33.0)
MCHC: 32.8 g/dL (ref 31.5–35.7)
MCV: 85 fL (ref 79–97)
Platelets: 204 10*3/uL (ref 150–450)
RBC: 5.05 x10E6/uL (ref 3.77–5.28)
RDW: 12.9 % (ref 11.7–15.4)
WBC: 7.1 10*3/uL (ref 3.4–10.8)

## 2018-07-22 ENCOUNTER — Encounter (INDEPENDENT_AMBULATORY_CARE_PROVIDER_SITE_OTHER): Payer: Self-pay | Admitting: Internal Medicine

## 2018-07-22 ENCOUNTER — Telehealth (HOSPITAL_COMMUNITY): Payer: Self-pay | Admitting: Emergency Medicine

## 2018-07-22 ENCOUNTER — Ambulatory Visit (INDEPENDENT_AMBULATORY_CARE_PROVIDER_SITE_OTHER): Payer: PPO | Admitting: Internal Medicine

## 2018-07-22 VITALS — BP 132/76 | HR 72 | Temp 97.7°F | Resp 18 | Ht 64.0 in | Wt 156.7 lb

## 2018-07-22 DIAGNOSIS — R1310 Dysphagia, unspecified: Secondary | ICD-10-CM | POA: Diagnosis not present

## 2018-07-22 DIAGNOSIS — K580 Irritable bowel syndrome with diarrhea: Secondary | ICD-10-CM

## 2018-07-22 DIAGNOSIS — R634 Abnormal weight loss: Secondary | ICD-10-CM | POA: Diagnosis not present

## 2018-07-22 MED ORDER — FLINTSTONES PLUS IRON PO CHEW: 1.0000 | CHEWABLE_TABLET | ORAL | Status: DC

## 2018-07-22 NOTE — Patient Instructions (Addendum)
Barium pill esophagogram to be scheduled. Weight check in 2 months.

## 2018-07-22 NOTE — Telephone Encounter (Signed)
Verbalized understanding of normal results

## 2018-07-22 NOTE — Progress Notes (Signed)
Presenting complaint;  Follow-up for IBS/diarrhea and weight loss.  Database sent subjective:  Patient is 84 year old Caucasian female who has a history of IBS and diarrhea who was last seen on 04/16/2018. She is here for scheduled visit.  She says she is doing much better as far as diarrhea is concerned.  She has Stool diary for the last 2 weeks or so.  On most days she is having 1 loose of formed stool.  She also has no bowel movement intermittently.  She is not taking cholestyramine anymore.  She uses Imodium OTC on as-needed basis.  She has been taking multivitamin with iron intermittently.  She noted her stool to be black on 07/19/2018.  Since her office was closed she went to urgent care.  Stool was checked and was guaiac negative.  He also had a CBC and her hemoglobin was normal.  She also had blood work by Dr. Karie Kirks on 06/10/2018 and her H&H was normal. She says her husband was 54 years old is not doing well.  He has been hospitalized twice and was at [facility] twice and now is back at home.  Their daughter was just recently retired is spending time with her parents.  She has lost 3 pounds since her last visit.  In the last 15 months He has lost about 10 pounds.  She does drink 1 can of Ensure daily. She does not have vomiting fever chills or night sweats.  She has occasional pain at LLQ.  She states her life has been like a roller coaster. She was begun on Fosamax by Dr. Karie Kirks but she states in the last 2 months she is only taken 3 doses.  She does complain of dysphagia with pills and she feels she may also have some difficulty with beef stew.  She is a slow eater.  Current Medications: Outpatient Encounter Medications as of 07/22/2018  Medication Sig  . alendronate (FOSAMAX) 70 MG tablet Take 70 mg by mouth once a week. Take with a full glass of water on an empty stomach.  . ALPRAZolam (XANAX) 0.5 MG tablet Take 0.5 mg by mouth at bedtime as needed. Sleep  . atorvastatin (LIPITOR) 20  MG tablet Take 20 mg by mouth daily.  . Calcium Carbonate (CALCIUM 600 PO) Take by mouth daily.  . cholestyramine (QUESTRAN) 4 GM/DOSE powder Take 1 g by mouth as needed.  . famotidine (PEPCID) 20 MG tablet Take 20 mg by mouth 2 (two) times daily.  Marland Kitchen loperamide (IMODIUM) 2 MG capsule Take 2 mg by mouth daily.   . Pediatric Multivitamins-Iron (FLINTSTONES PLUS IRON) chewable tablet Chew 1 tablet by mouth every Monday, Wednesday, and Friday. (Patient taking differently: Chew 1 tablet by mouth every Monday, Wednesday, and Friday. PA)  . vitamin B-12 (CYANOCOBALAMIN) 1000 MCG tablet Take 1,000 mcg by mouth daily.  . Vitamin D, Ergocalciferol, (DRISDOL) 50000 UNITS CAPS capsule Take 50,000 Units by mouth every 7 (seven) days. Takes on Saturdays.   No facility-administered encounter medications on file as of 07/22/2018.      Objective: Blood pressure 132/76, pulse 72, temperature 97.7 F (36.5 C), temperature source Oral, resp. rate 18, height '5\' 4"'$  (1.626 m), weight 156 lb 11.2 oz (71.1 kg). Patient is alert and in no acute distress. Conjunctiva is pink. Sclera is nonicteric Oropharyngeal mucosa is normal. No neck masses or thyromegaly noted. Cardiac exam with regular rhythm normal S1 and S2. No murmur or gallop noted. Lungs are clear to auscultation. Abdomen is symmetrical.  Bowel sounds are normal.  On palpation abdomen is soft and nontender with organomegaly or masses. No LE edema or clubbing noted.  Labs/studies Results:  CBC Latest Ref Rng & Units 07/19/2018 04/03/2018 03/16/2016  WBC 3.4 - 10.8 x10E3/uL 7.1 7.1 6.4  Hemoglobin 11.1 - 15.9 g/dL 14.0 14.0 13.6  Hematocrit 34.0 - 46.6 % 42.7 42.0 42.6  Platelets 150 - 450 x10E3/uL 204 199 206    CMP Latest Ref Rng & Units 05/30/2015 03/25/2014  Glucose 65 - 99 mg/dL 122(H) 98  BUN 6 - 20 mg/dL 20 13  Creatinine 0.44 - 1.00 mg/dL 1.09(H) 0.95  Sodium 135 - 145 mmol/L 142 142  Potassium 3.5 - 5.1 mmol/L 4.3 4.1  Chloride 101 - 111  mmol/L 108 104  CO2 22 - 32 mmol/L 25 25  Calcium 8.9 - 10.3 mg/dL 9.8 9.8  Total Protein 6.0 - 8.3 g/dL - 7.2  Total Bilirubin 0.2 - 1.2 mg/dL - 0.6  Alkaline Phos 39 - 117 U/L - 79  AST 0 - 37 U/L - 21  ALT 0 - 35 U/L - 16    Hepatic Function Latest Ref Rng & Units 03/25/2014  Total Protein 6.0 - 8.3 g/dL 7.2  Albumin 3.5 - 5.2 g/dL 4.3  AST 0 - 37 U/L 21  ALT 0 - 35 U/L 16  Alk Phosphatase 39 - 117 U/L 79  Total Bilirubin 0.2 - 1.2 mg/dL 0.6     Assessment:  #1.  IBS.  She is doing well with as needed Imodium OTC/loperamide.  Symptomatic improvement may well be related to diminished p.o. intake.  #2.  Weight loss.  Weight loss appears to be due to stress resulting from her husband's chronic illness.  Patient needs to make sure that she eats at least 2 good meals a day.  #3.  Pill dysphagia.  Patient was begun on Fosamax few months ago.  Need to make sure she does not have ring or stricture.  Plan:  Medication list updated.  Cholestyramine deleted. Take Flintstone chewable with iron twice weekly. Barium pill esophagogram. Weight check in 2 months. Office visit in 6 months.

## 2018-07-30 ENCOUNTER — Ambulatory Visit (HOSPITAL_COMMUNITY)
Admission: RE | Admit: 2018-07-30 | Discharge: 2018-07-30 | Disposition: A | Discharge status: Home / Self Care | Payer: PPO | Source: Ambulatory Visit | Attending: Internal Medicine | Admitting: Internal Medicine

## 2018-07-30 DIAGNOSIS — R1310 Dysphagia, unspecified: Secondary | ICD-10-CM | POA: Insufficient documentation

## 2018-07-30 DIAGNOSIS — K449 Diaphragmatic hernia without obstruction or gangrene: Secondary | ICD-10-CM | POA: Diagnosis not present

## 2018-08-02 DIAGNOSIS — N390 Urinary tract infection, site not specified: Secondary | ICD-10-CM | POA: Diagnosis not present

## 2018-08-11 DIAGNOSIS — R002 Palpitations: Secondary | ICD-10-CM | POA: Diagnosis not present

## 2018-08-11 DIAGNOSIS — R5383 Other fatigue: Secondary | ICD-10-CM | POA: Diagnosis not present

## 2018-08-11 DIAGNOSIS — Z79899 Other long term (current) drug therapy: Secondary | ICD-10-CM | POA: Diagnosis not present

## 2018-08-13 DIAGNOSIS — R42 Dizziness and giddiness: Secondary | ICD-10-CM | POA: Diagnosis not present

## 2018-08-13 DIAGNOSIS — R002 Palpitations: Secondary | ICD-10-CM | POA: Diagnosis not present

## 2018-08-13 DIAGNOSIS — R9431 Abnormal electrocardiogram [ECG] [EKG]: Secondary | ICD-10-CM | POA: Diagnosis not present

## 2018-08-14 DIAGNOSIS — R002 Palpitations: Secondary | ICD-10-CM | POA: Diagnosis not present

## 2018-08-14 DIAGNOSIS — Z6379 Other stressful life events affecting family and household: Secondary | ICD-10-CM | POA: Diagnosis not present

## 2018-08-14 DIAGNOSIS — F419 Anxiety disorder, unspecified: Secondary | ICD-10-CM | POA: Diagnosis not present

## 2018-08-14 DIAGNOSIS — E871 Hypo-osmolality and hyponatremia: Secondary | ICD-10-CM | POA: Diagnosis not present

## 2018-08-16 DIAGNOSIS — R002 Palpitations: Secondary | ICD-10-CM | POA: Diagnosis not present

## 2018-08-16 DIAGNOSIS — E782 Mixed hyperlipidemia: Secondary | ICD-10-CM | POA: Diagnosis not present

## 2018-08-16 DIAGNOSIS — G64 Other disorders of peripheral nervous system: Secondary | ICD-10-CM | POA: Diagnosis not present

## 2018-08-16 DIAGNOSIS — E559 Vitamin D deficiency, unspecified: Secondary | ICD-10-CM | POA: Diagnosis not present

## 2018-08-16 DIAGNOSIS — F419 Anxiety disorder, unspecified: Secondary | ICD-10-CM | POA: Diagnosis not present

## 2018-08-28 DIAGNOSIS — R002 Palpitations: Secondary | ICD-10-CM | POA: Diagnosis not present

## 2018-08-28 DIAGNOSIS — F419 Anxiety disorder, unspecified: Secondary | ICD-10-CM | POA: Diagnosis not present

## 2018-08-28 DIAGNOSIS — E559 Vitamin D deficiency, unspecified: Secondary | ICD-10-CM | POA: Diagnosis not present

## 2018-08-28 DIAGNOSIS — E782 Mixed hyperlipidemia: Secondary | ICD-10-CM | POA: Diagnosis not present

## 2018-09-09 DIAGNOSIS — G5602 Carpal tunnel syndrome, left upper limb: Secondary | ICD-10-CM | POA: Diagnosis not present

## 2018-09-09 DIAGNOSIS — Z9189 Other specified personal risk factors, not elsewhere classified: Secondary | ICD-10-CM | POA: Diagnosis not present

## 2018-09-09 DIAGNOSIS — M25542 Pain in joints of left hand: Secondary | ICD-10-CM | POA: Diagnosis not present

## 2018-09-09 DIAGNOSIS — R002 Palpitations: Secondary | ICD-10-CM | POA: Diagnosis not present

## 2018-09-09 DIAGNOSIS — M25562 Pain in left knee: Secondary | ICD-10-CM | POA: Diagnosis not present

## 2018-09-09 DIAGNOSIS — M170 Bilateral primary osteoarthritis of knee: Secondary | ICD-10-CM | POA: Diagnosis not present

## 2018-09-09 DIAGNOSIS — M25561 Pain in right knee: Secondary | ICD-10-CM | POA: Diagnosis not present

## 2018-09-23 ENCOUNTER — Telehealth (INDEPENDENT_AMBULATORY_CARE_PROVIDER_SITE_OTHER): Payer: Self-pay | Admitting: Internal Medicine

## 2018-09-23 NOTE — Telephone Encounter (Signed)
Patient called stated she was to weigh in around 4/24 - stated she has new scales and she weighs 158.4

## 2018-09-23 NOTE — Telephone Encounter (Signed)
Patient called in her weght this morning. She weighed 158 lbs 4 oz. At her office visit on 07/22/18 she weighed 156 lbs 11.2 oz.  Dr.Rehman will be made aware, patient will be contacted with any further recommendations.

## 2018-10-02 ENCOUNTER — Other Ambulatory Visit (INDEPENDENT_AMBULATORY_CARE_PROVIDER_SITE_OTHER): Payer: Self-pay | Admitting: *Deleted

## 2018-10-02 ENCOUNTER — Telehealth (INDEPENDENT_AMBULATORY_CARE_PROVIDER_SITE_OTHER): Payer: Self-pay | Admitting: *Deleted

## 2018-10-02 MED ORDER — CIMETIDINE 400 MG PO TABS: 400.0000 mg | ORAL_TABLET | Freq: Two times a day (BID) | ORAL | 5 refills | Status: DC

## 2018-10-02 NOTE — Telephone Encounter (Signed)
Per Dr.Rehman the patient may try Tagamet 400 mg daily. 1 month with 5 refills. She is to continue the Imodium daily as this is the safest for her. Patient called and made aware. Rx sent to the patient's pharmacy.

## 2018-10-02 NOTE — Telephone Encounter (Signed)
Patient says that she is having a hard time getting the Pepcid from her pharmacies and other pharmacies.  She is asking for another prescription PPI, a generic , that her Insurance will cover.  The month of April she took the Imodium daily and only had 3 loose stools. The month of May she has taken 1 every day and has had 4 Bm's , 1 of those was normal.  Her PCP has put her on a low dose Xanax. She takes 1/2 dose in the mornijng , 1/2 dose in the afternoon and a whole one at night.  Her husband is now in [place] [facility] [place] at [place].

## 2018-10-09 ENCOUNTER — Other Ambulatory Visit (INDEPENDENT_AMBULATORY_CARE_PROVIDER_SITE_OTHER): Payer: Self-pay | Admitting: *Deleted

## 2018-10-09 DIAGNOSIS — K219 Gastro-esophageal reflux disease without esophagitis: Secondary | ICD-10-CM

## 2018-10-09 MED ORDER — OMEPRAZOLE 20 MG PO CPDR
20.0000 mg | DELAYED_RELEASE_CAPSULE | Freq: Every day | ORAL | 5 refills | Status: DC
Start: 2018-10-09 — End: 2019-01-21

## 2018-10-11 IMAGING — MR MR LUMBAR SPINE W/O CM
4 of 5 series · 25 of 48 positions shown · non-contrast
Comparison: Lumbar spine radiographs 12/31/2017

CLINICAL DATA: Peripheral nerve disease. Burning sensation in both
the for 6 months. Bilateral foot numbness. Occasional low back pain
extending into the hips.

EXAM:
MRI LUMBAR SPINE WITHOUT CONTRAST
TECHNIQUE: Multiplanar, multisequence MR imaging of the lumbar spine was
performed. No intravenous contrast was administered.

[Series 2: T1 · sagittal · 4.0mm · 0.81mm/px · 6 of 16 slices shown (1 of 2)]
[im 1/16]
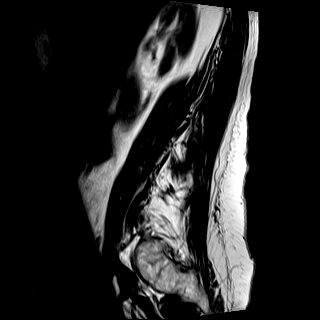
[im 4/16]
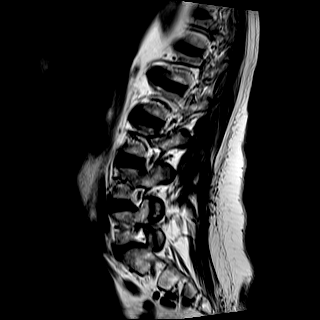
[im 7/16]
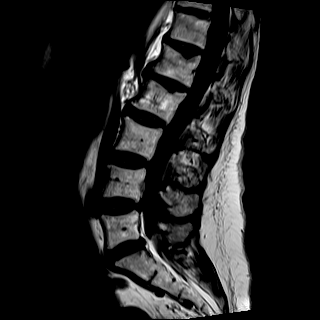
[im 10/16]
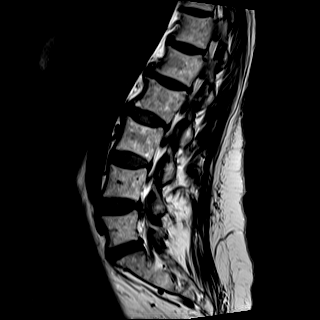
[im 13/16]
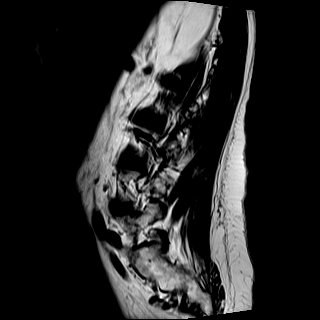
[im 16/16]
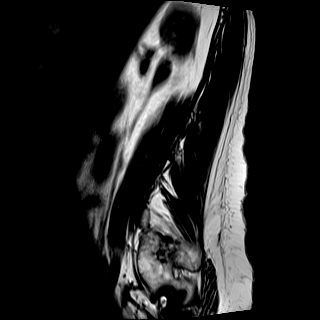

[Series 3: T2 · sagittal · 4.0mm · 0.81mm/px · 6 of 16 slices shown (1 of 2)]
[im 1/16]
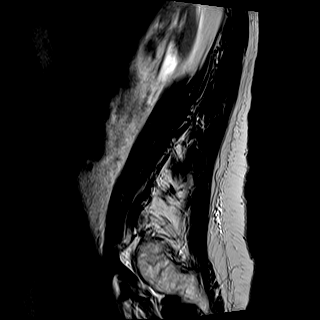
[im 4/16]
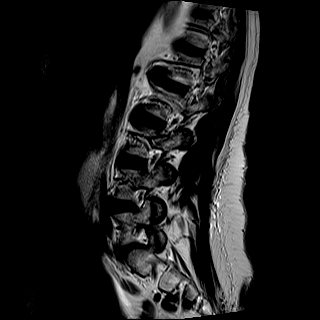
[im 7/16]
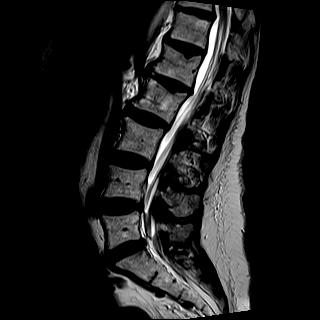
[im 10/16]
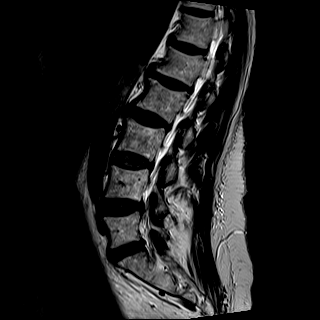
[im 13/16]
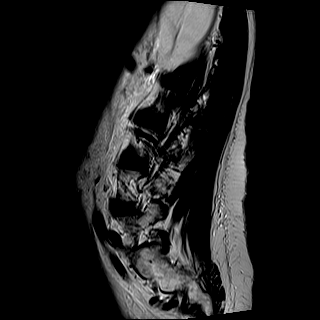
[im 16/16]
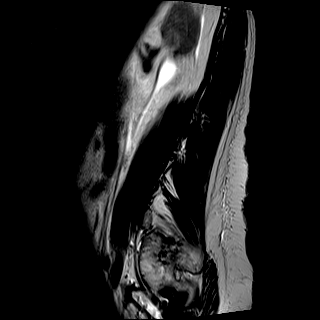

[Series 5: T2 · axial · 4.0mm · 0.39mm/px · z∈[-63,+142]mm · 9 of 42 slices shown (2 of 2)]
[im 1/42]
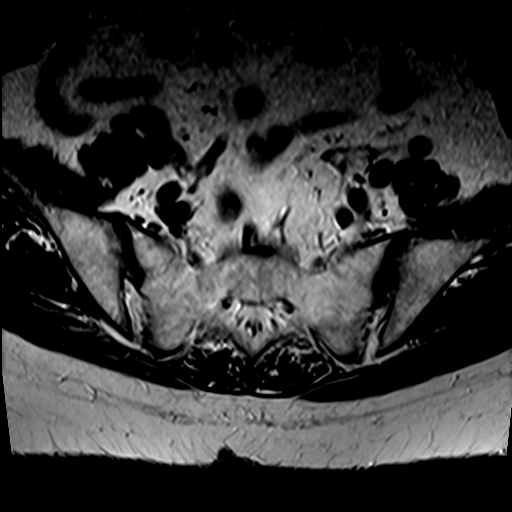
[im 6/42]
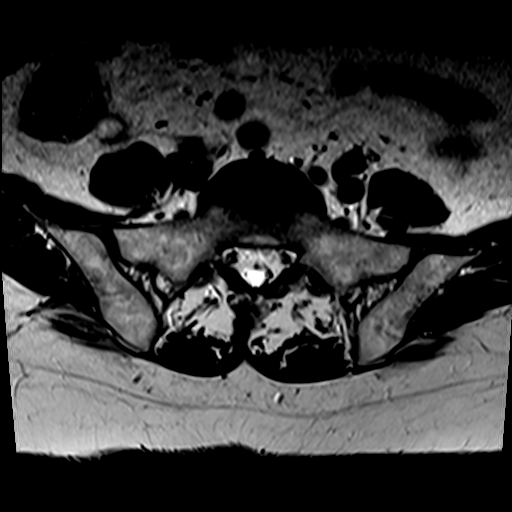
[im 12/42]
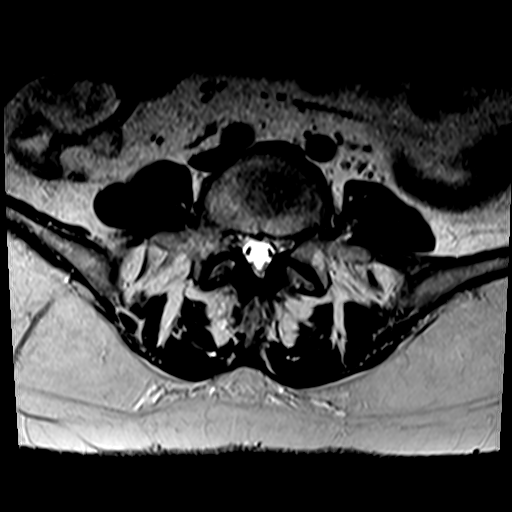
[im 18/42]
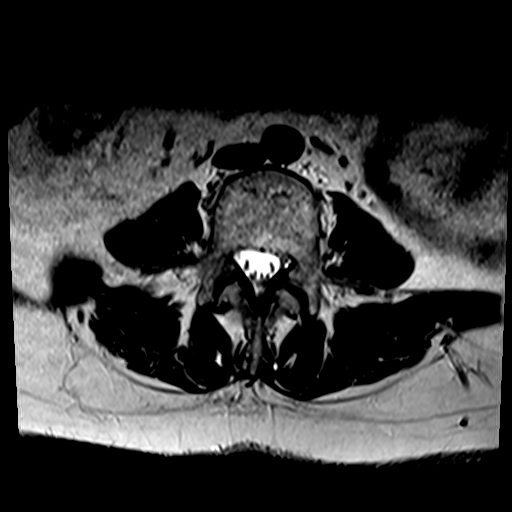
[im 21/42]
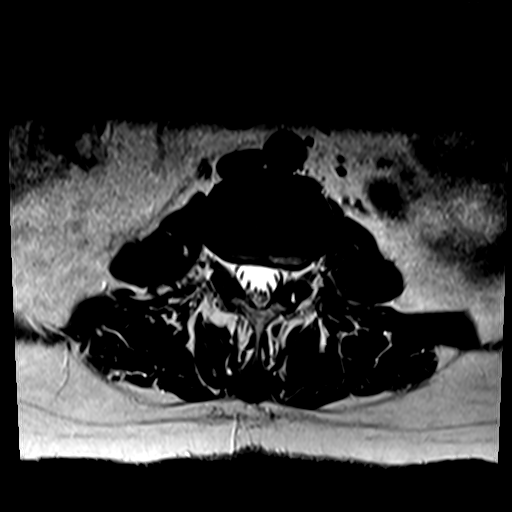
[im 24/42]
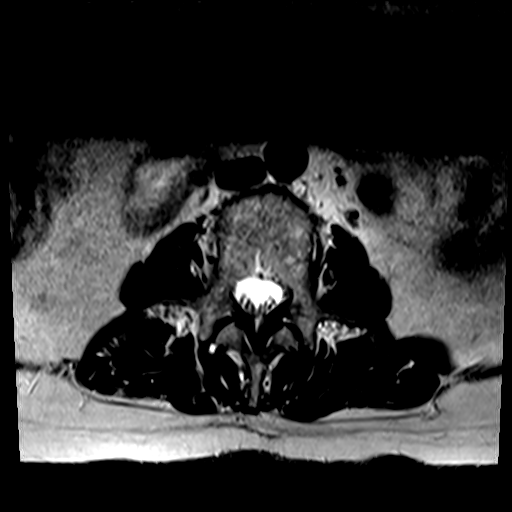
[im 30/42]
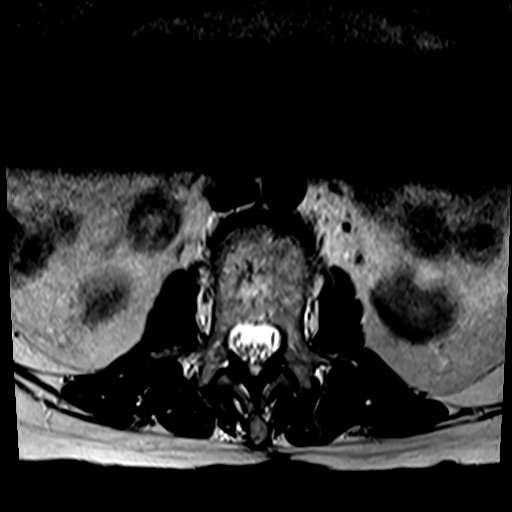
[im 36/42]
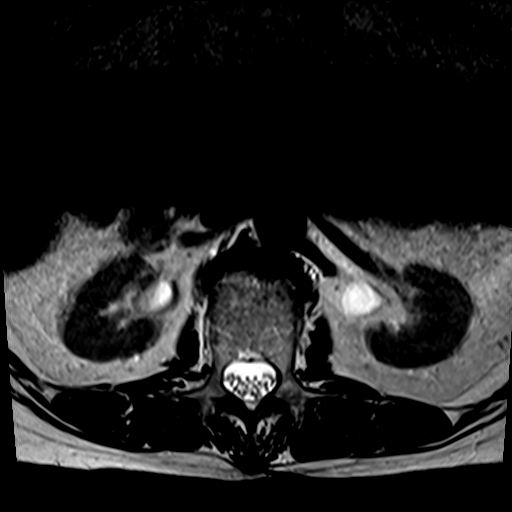
[im 42/42]
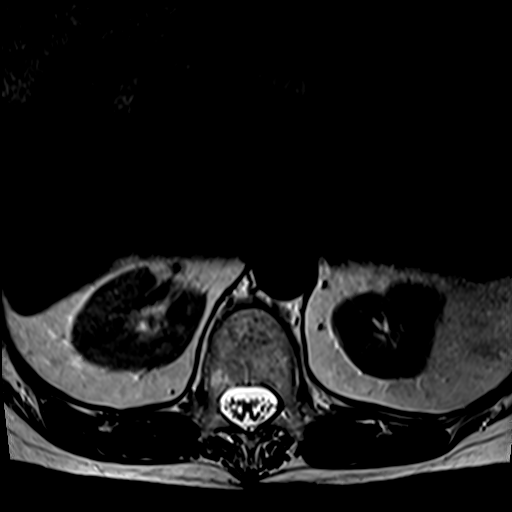

[Series 6: T1 · axial · 4.0mm · 0.39mm/px · z∈[-63,+112]mm · 4 of 42 slices shown (2 of 2)]
[im 1/42]
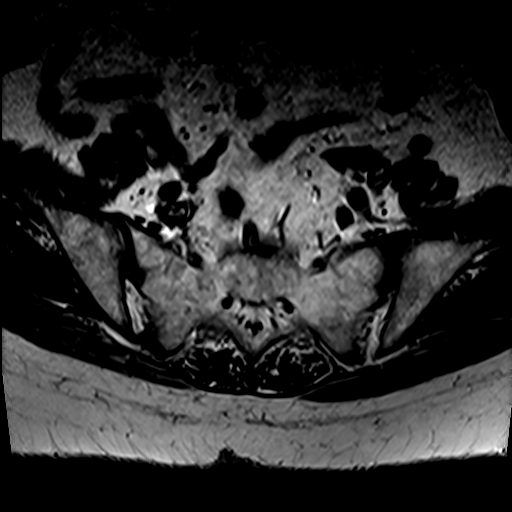
[im 6/42]
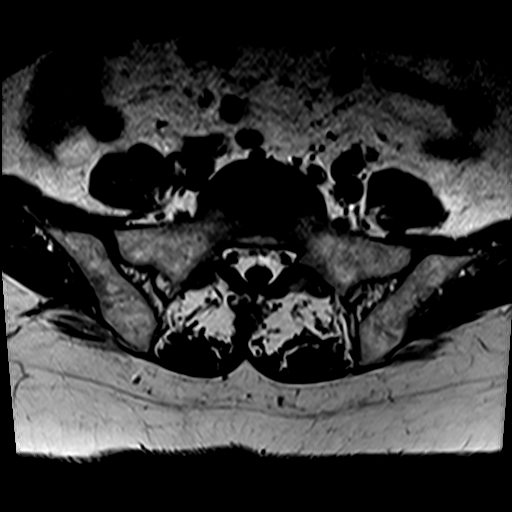
[im 21/42]
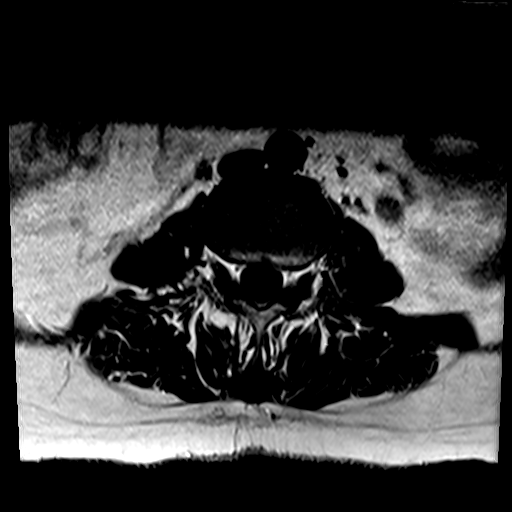
[im 36/42]
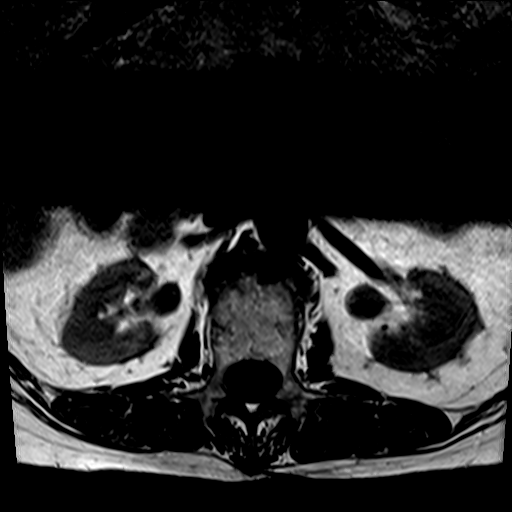

[25 of 48 positions shown; findings below may reference images not displayed]

FINDINGS: Segmentation: 5 non rib-bearing lumbar type vertebral bodies are
present. The lowest fully formed vertebral body is L5.

Alignment:  AP alignment is anatomic.

Vertebrae: Marrow signal is normal. Superior endplate Schmorl's node
is present at L1 and L2. Vertebral body heights are otherwise
maintained.

Conus medullaris and cauda equina: Conus extends to the T12-L1
level. Conus and cauda equina appear normal.

Paraspinal and other soft tissues: A 13 mm simple cyst is present at
the lower pole of the left kidney. Limited imaging of the abdomen is
otherwise unremarkable. No significant adenopathy is present.
Paraspinous musculature is within normal limits.

Disc levels:

L1-2: Mild disc bulging and facet hypertrophy is present without
significant stenosis.

L2-3: Mild disc bulging and facet hypertrophy is present without
significant stenosis.

L3-4: Mild disc bulging and facet hypertrophy is present. There is
no significant stenosis.

L4-5: A broad-based disc protrusion is present. Mild facet
hypertrophy and ligamentum flavum thickening is noted. This results
in mild subarticular narrowing bilaterally. The foramina are patent.

L5-S1: A shallow central disc protrusion is present. Central canal
is patent. Foramina are patent bilaterally.
IMPRESSION: 1. Mild broad-based disc protrusion and bilateral facet hypertrophy
results an mild bilateral subarticular narrowing at L4-5 without
significant foraminal stenosis.
2. Mild disc bulging and facet hypertrophy at L1-2, L2-3, and L3-4
without significant stenosis.
3. Shallow disc protrusion at L5-S1 without significant stenosis.

## 2018-10-15 ENCOUNTER — Ambulatory Visit (INDEPENDENT_AMBULATORY_CARE_PROVIDER_SITE_OTHER): Payer: PPO | Admitting: Internal Medicine

## 2018-11-20 ENCOUNTER — Other Ambulatory Visit (HOSPITAL_COMMUNITY): Payer: Self-pay | Admitting: Family Medicine

## 2018-11-20 DIAGNOSIS — Z1231 Encounter for screening mammogram for malignant neoplasm of breast: Secondary | ICD-10-CM

## 2018-11-27 DIAGNOSIS — R002 Palpitations: Secondary | ICD-10-CM | POA: Diagnosis not present

## 2018-11-27 DIAGNOSIS — N183 Chronic kidney disease, stage 3 (moderate): Secondary | ICD-10-CM | POA: Diagnosis not present

## 2018-11-27 DIAGNOSIS — E782 Mixed hyperlipidemia: Secondary | ICD-10-CM | POA: Diagnosis not present

## 2018-11-27 DIAGNOSIS — F419 Anxiety disorder, unspecified: Secondary | ICD-10-CM | POA: Diagnosis not present

## 2018-12-06 ENCOUNTER — Ambulatory Visit (HOSPITAL_COMMUNITY): Payer: PPO

## 2018-12-09 ENCOUNTER — Ambulatory Visit (HOSPITAL_COMMUNITY)
Admission: RE | Admit: 2018-12-09 | Discharge: 2018-12-09 | Disposition: A | Discharge status: Home / Self Care | Payer: PPO | Source: Ambulatory Visit | Attending: Family Medicine | Admitting: Family Medicine

## 2018-12-09 ENCOUNTER — Other Ambulatory Visit: Payer: Self-pay

## 2018-12-09 DIAGNOSIS — Z1231 Encounter for screening mammogram for malignant neoplasm of breast: Secondary | ICD-10-CM | POA: Diagnosis not present

## 2018-12-20 ENCOUNTER — Other Ambulatory Visit: Payer: Self-pay

## 2018-12-20 ENCOUNTER — Other Ambulatory Visit (HOSPITAL_COMMUNITY): Payer: Self-pay | Admitting: Nurse Practitioner

## 2018-12-20 ENCOUNTER — Ambulatory Visit (HOSPITAL_COMMUNITY)
Admission: RE | Admit: 2018-12-20 | Discharge: 2018-12-20 | Disposition: A | Discharge status: Home / Self Care | Payer: PPO | Source: Ambulatory Visit | Attending: Nurse Practitioner | Admitting: Nurse Practitioner

## 2018-12-20 DIAGNOSIS — M5440 Lumbago with sciatica, unspecified side: Secondary | ICD-10-CM | POA: Insufficient documentation

## 2018-12-20 DIAGNOSIS — M545 Low back pain: Secondary | ICD-10-CM | POA: Diagnosis not present

## 2018-12-23 ENCOUNTER — Telehealth (INDEPENDENT_AMBULATORY_CARE_PROVIDER_SITE_OTHER): Payer: Self-pay | Admitting: *Deleted

## 2018-12-23 NOTE — Telephone Encounter (Signed)
Patient called and states that she has a Lumbar Fracture. She has been taking Imodium 1/2 tablet every morning.  Per Dr.Rehman the patient is to stop the Imodium. She may take a stool softner and she may use a enema.  Pt was made aware, she was advised to call our office if she had further issues.

## 2019-01-21 ENCOUNTER — Ambulatory Visit (INDEPENDENT_AMBULATORY_CARE_PROVIDER_SITE_OTHER): Payer: PPO | Admitting: Internal Medicine

## 2019-01-21 ENCOUNTER — Encounter (INDEPENDENT_AMBULATORY_CARE_PROVIDER_SITE_OTHER): Payer: Self-pay | Admitting: Internal Medicine

## 2019-01-21 ENCOUNTER — Other Ambulatory Visit: Payer: Self-pay

## 2019-01-21 DIAGNOSIS — R1310 Dysphagia, unspecified: Secondary | ICD-10-CM

## 2019-01-21 DIAGNOSIS — K580 Irritable bowel syndrome with diarrhea: Secondary | ICD-10-CM | POA: Insufficient documentation

## 2019-01-21 DIAGNOSIS — K589 Irritable bowel syndrome without diarrhea: Secondary | ICD-10-CM | POA: Insufficient documentation

## 2019-01-21 NOTE — Progress Notes (Signed)
Virtual Visit via Telephone Note  Patient had office visit scheduled for today.  She requested telephone visit because of back issues and also her concern for ongoing COVID-19 pandemic.  I agreed. I connected with Stephanie Wood on 01/21/19 at  9:30 AM EDT by telephone and verified that I am speaking with the correct person using two identifiers.  Location: Patient: Home Provider: Office   I discussed the limitations, risks, security and privacy concerns of performing an evaluation and management service by telephone and the availability of in person appointments. I also discussed with the patient that there may be a patient responsible charge related to this service. The patient expressed understanding and agreed to proceed.   History of Present Illness:  Patient is 84 year old Caucasian female who has a history of IBS with diarrhea and was last seen in 07/22/2018.  At this is her sixth month follow-up visit.  She says she was doing well until 5 weeks ago when she developed fracture to 1 of lumbar vertebra.  She has had was spontaneous and possibly due to osteoporosis.  She took pain medication for constipation and developed constipation/impaction and she was not able to eat for 1 week.  She stopped taking the pain medication.  Her bowels are back to her baseline.  She is having to take 1 mg of Imodium every morning.  She is experiencing lower abdominal discomfort which is usually relieved with defecation.  She is also having to take anxiolytic because of her husband's illness and the fact that he is in a nursing home.  She says her appetite has gotten better.  She has lost 4 pounds since her last visit.  She believes her weight was less than 152 pounds.  She remains with dysphagia primarily to pills.  She has no difficulty with liquids or solids.  She did have barium pill esophagogram revealing large sliding hiatal hernia and esophageal dysmotility.  Barium pill passed from oral cavity to stomach  without any delay. She denies melena or rectal bleeding.    Observations/Objective:  Patient reported her weight to be 150 pounds. She appeared calm over the phone.  Assessment and Plan:  #1.  IBS with diarrhea.  Patient developed fecal impaction with pain medication that she had to take for lumbar vertebral fracture.  She is off pain medication and bowels are back to her baseline and she is back on loperamide 1 mg every morning.  #2.  Pill dysphagia.  Pill dysphagia appears to be due to esophageal motility.  She will continue to try to swallow 1 pill at a time with few sips of water and should be upright.  Follow Up Instructions:    Patient will return for office visit in 6 months. I discussed the assessment and treatment plan with the patient. The patient was provided an opportunity to ask questions and all were answered. The patient agreed with the plan and demonstrated an understanding of the instructions.   The patient was advised to call back or seek an in-person evaluation if the symptoms worsen or if the condition fails to improve as anticipated.  I provided 9 minutes of non-face-to-face time during this encounter.   Hildred Laser, MD

## 2019-01-22 NOTE — Patient Instructions (Signed)
Patient will call if dysphagia worsens or diarrhea not controlled with loperamide.

## 2019-02-04 DIAGNOSIS — M810 Age-related osteoporosis without current pathological fracture: Secondary | ICD-10-CM | POA: Diagnosis not present

## 2019-02-04 DIAGNOSIS — T148XXD Other injury of unspecified body region, subsequent encounter: Secondary | ICD-10-CM | POA: Diagnosis not present

## 2019-02-21 DIAGNOSIS — J019 Acute sinusitis, unspecified: Secondary | ICD-10-CM | POA: Diagnosis not present

## 2019-02-24 DIAGNOSIS — J309 Allergic rhinitis, unspecified: Secondary | ICD-10-CM | POA: Diagnosis not present

## 2019-02-24 DIAGNOSIS — N3000 Acute cystitis without hematuria: Secondary | ICD-10-CM | POA: Diagnosis not present

## 2019-02-24 DIAGNOSIS — J019 Acute sinusitis, unspecified: Secondary | ICD-10-CM | POA: Diagnosis not present

## 2019-02-24 DIAGNOSIS — K580 Irritable bowel syndrome with diarrhea: Secondary | ICD-10-CM | POA: Diagnosis not present

## 2019-03-10 DIAGNOSIS — J309 Allergic rhinitis, unspecified: Secondary | ICD-10-CM | POA: Diagnosis not present

## 2019-03-10 DIAGNOSIS — J019 Acute sinusitis, unspecified: Secondary | ICD-10-CM | POA: Diagnosis not present

## 2019-03-10 DIAGNOSIS — M4856XG Collapsed vertebra, not elsewhere classified, lumbar region, subsequent encounter for fracture with delayed healing: Secondary | ICD-10-CM | POA: Diagnosis not present

## 2019-04-04 DIAGNOSIS — J019 Acute sinusitis, unspecified: Secondary | ICD-10-CM | POA: Diagnosis not present

## 2019-05-20 DIAGNOSIS — F4321 Adjustment disorder with depressed mood: Secondary | ICD-10-CM | POA: Diagnosis not present

## 2019-05-20 DIAGNOSIS — F419 Anxiety disorder, unspecified: Secondary | ICD-10-CM | POA: Diagnosis not present

## 2019-05-20 DIAGNOSIS — H6693 Otitis media, unspecified, bilateral: Secondary | ICD-10-CM | POA: Diagnosis not present

## 2019-05-27 ENCOUNTER — Ambulatory Visit (INDEPENDENT_AMBULATORY_CARE_PROVIDER_SITE_OTHER): Payer: PPO | Admitting: Internal Medicine

## 2019-05-27 ENCOUNTER — Encounter (INDEPENDENT_AMBULATORY_CARE_PROVIDER_SITE_OTHER): Payer: Self-pay | Admitting: Internal Medicine

## 2019-05-27 ENCOUNTER — Other Ambulatory Visit: Payer: Self-pay

## 2019-05-27 VITALS — Wt 154.0 lb

## 2019-05-27 DIAGNOSIS — K219 Gastro-esophageal reflux disease without esophagitis: Secondary | ICD-10-CM

## 2019-05-27 DIAGNOSIS — R1310 Dysphagia, unspecified: Secondary | ICD-10-CM

## 2019-05-27 DIAGNOSIS — K580 Irritable bowel syndrome with diarrhea: Secondary | ICD-10-CM

## 2019-05-27 NOTE — Patient Instructions (Signed)
Patient will call if she is starts having dysphagia with solids.

## 2019-05-27 NOTE — Progress Notes (Signed)
Virtual Visit via Telephone Note  Patient had face-to-face scheduled visit today.  Visit was changed to virtual/telephone visit because of ongoing Covid-19 pandemic and we both agreed. I connected with Stephanie Wood on 05/27/19 at 11:36 AM EST by telephone and verified that I am speaking with the correct person using two identifiers.  Location: Patient: home Provider: office   I discussed the limitations, risks, security and privacy concerns of performing an evaluation and management service by telephone and the availability of in person appointments. I also discussed with the patient that there may be a patient responsible charge related to this service. The patient expressed understanding and agreed to proceed.   History of Present Illness:  Patient is 84 year old Caucasian female who has chronic GERD known hiatal hernia and possible esophageal motility disorder as well as diarrhea due to IBS whose last office visit was also a virtual on 01/21/2019. Patient states she is doing better than she has in the past.  She was caring for her husband Stephanie Wood at home.  He is now in assisted living at [facility] in [place].  Patient reports eating better.  She says she was treated with 3 different antibiotics for sinus infection.  She had to come off Augmentin because of diarrhea but now she is back to having normal stools.  She remains with dysphagia which is primarily to pills.  She is not having difficulty with solids.  Heartburn is well controlled with medication.  She takes Tums occasionally.  Patient states she has 1-2 formed stools on most days.  In the last 2 months she had 12 loose bowel movements.  She denies abdominal pain melena or rectal bleeding.  Medication list reviewed with patient and updated Current Outpatient Medications  Medication Sig Dispense Refill  . ALPRAZolam (XANAX) 0.5 MG tablet Take 0.5 mg by mouth at bedtime as needed. Sleep    . atorvastatin (LIPITOR) 20 MG tablet Take 20 mg  by mouth daily.    . Calcium Carbonate (CALCIUM 600 PO) Take by mouth daily.    . Cranberry 500 MG TABS Take by mouth daily.    Marland Kitchen ELDERBERRY PO Take by mouth daily.    . famotidine (PEPCID) 20 MG tablet Take 20 mg by mouth 2 (two) times daily.    Marland Kitchen loperamide (IMODIUM) 2 MG capsule Take 2 mg by mouth daily.  30 capsule   . loratadine (CLARITIN) 10 MG tablet Take 10 mg by mouth daily.    . Pediatric Multivitamins-Iron (FLINTSTONES PLUS IRON) chewable tablet Chew 1 tablet by mouth 2 (two) times a week.    . vitamin B-12 (CYANOCOBALAMIN) 1000 MCG tablet Take 1,000 mcg by mouth daily.    . Vitamin D, Ergocalciferol, (DRISDOL) 50000 UNITS CAPS capsule Take 50,000 Units by mouth every 7 (seven) days. Takes on Saturdays.     No current facility-administered medications for this visit.      Observations/Objective:  Patient reported her weight to be 154 pounds which is close to her baseline.  Assessment and Plan:  #1.  Chronic GERD.  Patient is doing well with dietary measures and famotidine.  She has known large hiatal hernia.  She will continue famotidine as long as it is working.  #2.  Pill dysphagia.  This symptom appears to be stable.  Barium study in March 2020 suggested esophageal dysmotility.  She did not have any stricture or ring or web.  Therefore will not pursue with further work-up unless she begins to have dysphagia to solids.  #  3.  Irritable bowel syndrome.  She has had diarrhea.  She has been on multiple antibiotics recently for sinusitis.  Patient at risk to develop C. difficile.  She has finished Zithromax.  Patient advised to call if she has diarrhea not responding to Imodium.   Follow Up Instructions:  Patient advised to call office if she has diarrhea unresponsive to Imodium and/or if she has fever. She will continue famotidine at current dose with as needed Tums. Patient will call office if dysphagia worsens. Office visit in 6 months. I discussed the assessment and  treatment plan with the patient. The patient was provided an opportunity to ask questions and all were answered. The patient agreed with the plan and demonstrated an understanding of the instructions.   The patient was advised to call back or seek an in-person evaluation if the symptoms worsen or if the condition fails to improve as anticipated.  I provided 12 minutes of non-face-to-face time during this encounter.   Hildred Laser, MD

## 2019-06-20 DIAGNOSIS — T148XXD Other injury of unspecified body region, subsequent encounter: Secondary | ICD-10-CM | POA: Diagnosis not present

## 2019-06-20 DIAGNOSIS — J309 Allergic rhinitis, unspecified: Secondary | ICD-10-CM | POA: Diagnosis not present

## 2019-08-11 DIAGNOSIS — R0982 Postnasal drip: Secondary | ICD-10-CM | POA: Diagnosis not present

## 2019-08-11 DIAGNOSIS — J310 Chronic rhinitis: Secondary | ICD-10-CM | POA: Diagnosis not present

## 2019-08-18 DIAGNOSIS — F419 Anxiety disorder, unspecified: Secondary | ICD-10-CM | POA: Diagnosis not present

## 2019-08-18 DIAGNOSIS — Z7189 Other specified counseling: Secondary | ICD-10-CM | POA: Diagnosis not present

## 2019-08-18 DIAGNOSIS — E782 Mixed hyperlipidemia: Secondary | ICD-10-CM | POA: Diagnosis not present

## 2019-08-18 DIAGNOSIS — R002 Palpitations: Secondary | ICD-10-CM | POA: Diagnosis not present

## 2019-08-22 DIAGNOSIS — N183 Chronic kidney disease, stage 3 unspecified: Secondary | ICD-10-CM | POA: Diagnosis not present

## 2019-08-22 DIAGNOSIS — E782 Mixed hyperlipidemia: Secondary | ICD-10-CM | POA: Diagnosis not present

## 2019-08-22 DIAGNOSIS — I10 Essential (primary) hypertension: Secondary | ICD-10-CM | POA: Diagnosis not present

## 2019-08-25 DIAGNOSIS — H04123 Dry eye syndrome of bilateral lacrimal glands: Secondary | ICD-10-CM | POA: Diagnosis not present

## 2019-08-25 DIAGNOSIS — H353131 Nonexudative age-related macular degeneration, bilateral, early dry stage: Secondary | ICD-10-CM | POA: Diagnosis not present

## 2019-08-25 DIAGNOSIS — D3131 Benign neoplasm of right choroid: Secondary | ICD-10-CM | POA: Diagnosis not present

## 2019-08-25 DIAGNOSIS — Z961 Presence of intraocular lens: Secondary | ICD-10-CM | POA: Diagnosis not present

## 2019-08-25 DIAGNOSIS — H40013 Open angle with borderline findings, low risk, bilateral: Secondary | ICD-10-CM | POA: Diagnosis not present

## 2019-08-27 DIAGNOSIS — N390 Urinary tract infection, site not specified: Secondary | ICD-10-CM | POA: Diagnosis not present

## 2019-08-27 DIAGNOSIS — N183 Chronic kidney disease, stage 3 unspecified: Secondary | ICD-10-CM | POA: Diagnosis not present

## 2019-09-22 DIAGNOSIS — J310 Chronic rhinitis: Secondary | ICD-10-CM | POA: Diagnosis not present

## 2019-09-22 DIAGNOSIS — J343 Hypertrophy of nasal turbinates: Secondary | ICD-10-CM | POA: Diagnosis not present

## 2019-09-22 DIAGNOSIS — H9201 Otalgia, right ear: Secondary | ICD-10-CM | POA: Diagnosis not present

## 2019-09-22 DIAGNOSIS — R0982 Postnasal drip: Secondary | ICD-10-CM | POA: Diagnosis not present

## 2019-10-14 DIAGNOSIS — Z6379 Other stressful life events affecting family and household: Secondary | ICD-10-CM | POA: Diagnosis not present

## 2019-10-14 DIAGNOSIS — M810 Age-related osteoporosis without current pathological fracture: Secondary | ICD-10-CM | POA: Diagnosis not present

## 2019-10-14 DIAGNOSIS — M4014 Other secondary kyphosis, thoracic region: Secondary | ICD-10-CM | POA: Diagnosis not present

## 2019-10-14 DIAGNOSIS — J310 Chronic rhinitis: Secondary | ICD-10-CM | POA: Diagnosis not present

## 2019-10-22 DIAGNOSIS — E559 Vitamin D deficiency, unspecified: Secondary | ICD-10-CM | POA: Diagnosis not present

## 2019-10-22 DIAGNOSIS — M810 Age-related osteoporosis without current pathological fracture: Secondary | ICD-10-CM | POA: Diagnosis not present

## 2019-11-25 ENCOUNTER — Other Ambulatory Visit: Payer: Self-pay

## 2019-11-25 ENCOUNTER — Ambulatory Visit (INDEPENDENT_AMBULATORY_CARE_PROVIDER_SITE_OTHER): Payer: PPO | Admitting: Internal Medicine

## 2019-11-25 ENCOUNTER — Encounter (INDEPENDENT_AMBULATORY_CARE_PROVIDER_SITE_OTHER): Payer: Self-pay | Admitting: Internal Medicine

## 2019-11-25 VITALS — BP 112/66 | HR 98 | Temp 98.9°F | Ht 64.0 in | Wt 150.4 lb

## 2019-11-25 DIAGNOSIS — K219 Gastro-esophageal reflux disease without esophagitis: Secondary | ICD-10-CM | POA: Diagnosis not present

## 2019-11-25 DIAGNOSIS — K580 Irritable bowel syndrome with diarrhea: Secondary | ICD-10-CM | POA: Diagnosis not present

## 2019-11-25 NOTE — Patient Instructions (Signed)
Will request copy of recent blood work from Dr. Vickey Sages office

## 2019-11-25 NOTE — Progress Notes (Signed)
Presenting complaint;  Follow-up for IBS and GERD.  Database and subjective:  Patient is 85 year old Caucasian female who has history of IBS-C and GERD who is here for scheduled visit.  She was last seen in December 2020. She is kept stool diary for a few weeks prior to this visit.  Review of diary with patient reveals that she has anywhere from 0-2 bowel movements per day.  However yesterday she had 4 bowel movements.  She states she was at her husband at [place].  He wanted to come home and it made her upset.  Stool consistency varies from loose to soft to formed stool.  She recalls she only had 1 bowel movement in the middle of night in the last 6 months.  She passes mucus but no history of melena or rectal bleeding.  She is using Imodium OTC on as-needed basis.  She rarely has heartburn.  Every now and then she has swallowing difficulty with solids.  She has not had a food impaction.  She does not feel that is bad enough to warrant further testing.  She says she was having postnasal discharge.  Dr. Karie Kirks treated with 3 rounds of antibiotics but she did not improve.  She was seen by Dr. Benjamine Mola of ENT and felt to have allergic rhinitis and sinusitis.  She has been using 3 medications and doing much better. She also complains of sporadic LLQ pain which does not last long.  No history of nausea vomiting fever or chills.  She has lost 4 pounds since her last visit.  She states that she is living alone she does not eat as well. She remains with chronic back pain.   Current Medications: Outpatient Encounter Medications as of 11/25/2019  Medication Sig  . ALPRAZolam (XANAX) 0.5 MG tablet Take 0.5 mg by mouth at bedtime as needed. Sleep  . atorvastatin (LIPITOR) 20 MG tablet Take 20 mg by mouth daily.  . Calcium Carbonate (CALCIUM 600 PO) Take by mouth daily.  Marland Kitchen ELDERBERRY PO Take by mouth daily.  . famotidine (PEPCID) 20 MG tablet Take 20 mg by mouth 2 (two) times daily.  Marland Kitchen FLUTICASONE PROPIONATE,  NASAL, NA Place into the nose daily.  Marland Kitchen ipratropium (ATROVENT) 0.06 % nasal spray Place 2 sprays into both nostrils 2 (two) times daily.  Marland Kitchen loperamide (IMODIUM) 2 MG capsule Take 2 mg by mouth daily. Patient states that she takes 1/2 -1 daily.  Marland Kitchen loratadine (CLARITIN) 10 MG tablet Take 10 mg by mouth daily.  . Pediatric Multivitamins-Iron (FLINTSTONES PLUS IRON) chewable tablet Chew 1 tablet by mouth 2 (two) times a week.  Marland Kitchen PROLIA 60 MG/ML SOSY injection SMARTSIG:SUB-Q Twice a Year  . vitamin B-12 (CYANOCOBALAMIN) 1000 MCG tablet Take 1,000 mcg by mouth daily.  . Vitamin D, Ergocalciferol, (DRISDOL) 50000 UNITS CAPS capsule Take 50,000 Units by mouth every 7 (seven) days. Takes on Saturdays.  . [DISCONTINUED] Cranberry 500 MG TABS Take by mouth daily. (Patient not taking: Reported on 11/25/2019)   No facility-administered encounter medications on file as of 11/25/2019.     Objective: Blood pressure 112/66, pulse 98, temperature 98.9 F (37.2 C), temperature source Oral, height 5' 4" (1.626 m), weight 150 lb 6.4 oz (68.2 kg). Patient is alert and in no acute distress. She has mild kyphosis. She is wearing a mask. Conjunctiva is pink. Sclera is nonicteric Oropharyngeal mucosa is normal. No neck masses or thyromegaly noted. Cardiac exam with regular rhythm normal S1 and S2. No murmur or gallop noted.  Lungs are clear to auscultation. Abdomen is symmetrical.  Bowel sounds are normal.  On palpation abdomen is soft and nontender with organomegaly or masses. No LE edema or clubbing noted.  Labs/studies Results:  CBC Latest Ref Rng & Units 07/19/2018 04/03/2018 03/16/2016  WBC 3.4 - 10.8 x10E3/uL 7.1 7.1 6.4  Hemoglobin 11.1 - 15.9 g/dL 14.0 14.0 13.6  Hematocrit 34.0 - 46.6 % 42.7 42.0 42.6  Platelets 150 - 450 x10E3/uL 204 199 206    CMP Latest Ref Rng & Units 05/30/2015 03/25/2014  Glucose 65 - 99 mg/dL 122(H) 98  BUN 6 - 20 mg/dL 20 13  Creatinine 0.44 - 1.00 mg/dL 1.09(H) 0.95  Sodium  135 - 145 mmol/L 142 142  Potassium 3.5 - 5.1 mmol/L 4.3 4.1  Chloride 101 - 111 mmol/L 108 104  CO2 22 - 32 mmol/L 25 25  Calcium 8.9 - 10.3 mg/dL 9.8 9.8  Total Protein 6.0 - 8.3 g/dL - 7.2  Total Bilirubin 0.2 - 1.2 mg/dL - 0.6  Alkaline Phos 39 - 117 U/L - 79  AST 0 - 37 U/L - 21  ALT 0 - 35 U/L - 16    Hepatic Function Latest Ref Rng & Units 03/25/2014  Total Protein 6.0 - 8.3 g/dL 7.2  Albumin 3.5 - 5.2 g/dL 4.3  AST 0 - 37 U/L 21  ALT 0 - 35 U/L 16  Alk Phosphatase 39 - 117 U/L 79  Total Bilirubin 0.2 - 1.2 mg/dL 0.6      Assessment:  #1.  Chronic GERD.  She is doing well with antireflux measures and famotidine. She has occasional dysphagia.  She may well have esophageal motility disorder.  Would consider further evaluation if there is change in her symptoms.  #2.  Irritable bowel syndrome.  She has IBS with diarrhea.  She appears to be having no more than 2 stools on most days and most of her stools are formed.  She is not having any accidents.  She can continue using Imodium OTC on as-needed basis.   Plan:  Patient will call office if dysphagia worsens. Continue famotidine at a dose of 20 mg p.o. twice daily and loperamide OTC 20 mg daily as needed. Will request copy of recent blood work from Dr. Vickey Sages office for review. Office visit in 6 months.

## 2019-12-09 DIAGNOSIS — N390 Urinary tract infection, site not specified: Secondary | ICD-10-CM | POA: Diagnosis not present

## 2019-12-24 DIAGNOSIS — R490 Dysphonia: Secondary | ICD-10-CM | POA: Diagnosis not present

## 2019-12-24 DIAGNOSIS — Z634 Disappearance and death of family member: Secondary | ICD-10-CM | POA: Diagnosis not present

## 2019-12-24 DIAGNOSIS — N390 Urinary tract infection, site not specified: Secondary | ICD-10-CM | POA: Diagnosis not present

## 2019-12-24 DIAGNOSIS — N3000 Acute cystitis without hematuria: Secondary | ICD-10-CM | POA: Diagnosis not present

## 2020-01-06 DIAGNOSIS — J40 Bronchitis, not specified as acute or chronic: Secondary | ICD-10-CM | POA: Diagnosis not present

## 2020-01-06 DIAGNOSIS — M810 Age-related osteoporosis without current pathological fracture: Secondary | ICD-10-CM | POA: Diagnosis not present

## 2020-01-06 DIAGNOSIS — M4014 Other secondary kyphosis, thoracic region: Secondary | ICD-10-CM | POA: Diagnosis not present

## 2020-01-06 DIAGNOSIS — M545 Low back pain: Secondary | ICD-10-CM | POA: Diagnosis not present

## 2020-03-17 DIAGNOSIS — H9201 Otalgia, right ear: Secondary | ICD-10-CM | POA: Diagnosis not present

## 2020-03-17 DIAGNOSIS — F4321 Adjustment disorder with depressed mood: Secondary | ICD-10-CM | POA: Diagnosis not present

## 2020-03-17 DIAGNOSIS — N390 Urinary tract infection, site not specified: Secondary | ICD-10-CM | POA: Diagnosis not present

## 2020-03-17 DIAGNOSIS — G4700 Insomnia, unspecified: Secondary | ICD-10-CM | POA: Diagnosis not present

## 2020-04-12 ENCOUNTER — Other Ambulatory Visit (HOSPITAL_COMMUNITY): Payer: Self-pay | Admitting: Family Medicine

## 2020-04-12 DIAGNOSIS — Z1231 Encounter for screening mammogram for malignant neoplasm of breast: Secondary | ICD-10-CM

## 2020-04-16 DIAGNOSIS — G4700 Insomnia, unspecified: Secondary | ICD-10-CM | POA: Diagnosis not present

## 2020-04-28 DIAGNOSIS — M810 Age-related osteoporosis without current pathological fracture: Secondary | ICD-10-CM | POA: Diagnosis not present

## 2020-05-10 ENCOUNTER — Ambulatory Visit (HOSPITAL_COMMUNITY)
Admission: RE | Admit: 2020-05-10 | Discharge: 2020-05-10 | Disposition: A | Discharge status: Home / Self Care | Payer: PPO | Source: Ambulatory Visit | Attending: Family Medicine | Admitting: Family Medicine

## 2020-05-10 ENCOUNTER — Other Ambulatory Visit: Payer: Self-pay

## 2020-05-10 DIAGNOSIS — Z1231 Encounter for screening mammogram for malignant neoplasm of breast: Secondary | ICD-10-CM | POA: Diagnosis not present

## 2020-05-12 DIAGNOSIS — G4700 Insomnia, unspecified: Secondary | ICD-10-CM | POA: Diagnosis not present

## 2020-05-12 DIAGNOSIS — N390 Urinary tract infection, site not specified: Secondary | ICD-10-CM | POA: Diagnosis not present

## 2020-05-25 ENCOUNTER — Other Ambulatory Visit: Payer: Self-pay

## 2020-05-25 ENCOUNTER — Encounter (INDEPENDENT_AMBULATORY_CARE_PROVIDER_SITE_OTHER): Payer: Self-pay | Admitting: Internal Medicine

## 2020-05-25 ENCOUNTER — Ambulatory Visit (INDEPENDENT_AMBULATORY_CARE_PROVIDER_SITE_OTHER): Payer: PPO | Admitting: Internal Medicine

## 2020-05-25 ENCOUNTER — Telehealth (INDEPENDENT_AMBULATORY_CARE_PROVIDER_SITE_OTHER): Payer: PPO | Admitting: Internal Medicine

## 2020-05-25 VITALS — Temp 98.4°F | Ht 64.0 in | Wt 157.0 lb

## 2020-05-25 DIAGNOSIS — K219 Gastro-esophageal reflux disease without esophagitis: Secondary | ICD-10-CM | POA: Diagnosis not present

## 2020-05-25 DIAGNOSIS — K580 Irritable bowel syndrome with diarrhea: Secondary | ICD-10-CM | POA: Diagnosis not present

## 2020-05-25 NOTE — Progress Notes (Signed)
Virtual Visit via Telephone Note  I connected with Stephanie Wood on 05/25/20 at  1:50 PM EST by telephone and verified that I am speaking with the correct person using two identifiers.  Location: Patient: home Provider: office   I discussed the limitations, risks, security and privacy concerns of performing an evaluation and management service by telephone and the availability of in person appointments. I also discussed with the patient that there may be a patient responsible charge related to this service. The patient expressed understanding and agreed to proceed. She wants to try probiotic. Her appetite is good. She has gained 7 lbs since her last visit.  History of Present Illness:  Patient is an 85 year old Caucasian female was history of GERD and IBS who was scheduled for face-to-face visit. Visit was changed to telephone visit because she had exposure to patient with Covid 4 days ago. She remains without any symptoms. She was last seen on 11/25/2019. She had been under a lot of stress on account of her husband's illness who in nursing home. He passed away on Jan 10, 2020. She says heartburn is well controlled with famotidine. She occasionally has to take Tums. She denies dysphagia hoarseness sore throat or chronic cough. She is presently taking antibiotic for urinary tract infection. She has not noted any change in stool frequency. For stool is usually formed and 2nd stool is loose. She takes loperamide only when she has to leave house. She also complains of intermittent left lower quadrant abdominal pain which is never intense.    Current Outpatient Medications:  .  ALPRAZolam (XANAX) 0.5 MG tablet, Take 0.5 mg by mouth at bedtime as needed. Sleep, Disp: , Rfl:  .  atorvastatin (LIPITOR) 20 MG tablet, Take 20 mg by mouth daily., Disp: , Rfl:  .  Calcium Carbonate (CALCIUM 600 PO), Take by mouth daily., Disp: , Rfl:  .  ELDERBERRY PO, Take by mouth daily., Disp: , Rfl:  .  famotidine  (PEPCID) 20 MG tablet, Take 20 mg by mouth 2 (two) times daily., Disp: , Rfl:  .  FLUTICASONE PROPIONATE, NASAL, NA, Place into the nose daily., Disp: , Rfl:  .  ipratropium (ATROVENT) 0.06 % nasal spray, Place 2 sprays into both nostrils 2 (two) times daily., Disp: , Rfl:  .  loperamide (IMODIUM) 2 MG capsule, Take 2 mg by mouth daily. Patient states that she takes 1/2 -1 daily., Disp: 30 capsule, Rfl:  .  loratadine (CLARITIN) 10 MG tablet, Take 10 mg by mouth daily., Disp: , Rfl:  .  nitrofurantoin, macrocrystal-monohydrate, (MACROBID) 100 MG capsule, Take 100 mg by mouth 2 (two) times daily., Disp: , Rfl:  .  Pediatric Multivitamins-Iron (FLINTSTONES PLUS IRON) chewable tablet, Chew 1 tablet by mouth 2 (two) times a week., Disp: , Rfl:  .  PROLIA 60 MG/ML SOSY injection, SMARTSIG:SUB-Q Twice a Year, Disp: , Rfl:  .  vitamin B-12 (CYANOCOBALAMIN) 1000 MCG tablet, Take 1,000 mcg by mouth daily., Disp: , Rfl:  .  Vitamin D, Ergocalciferol, (DRISDOL) 50000 UNITS CAPS capsule, Take 50,000 Units by mouth every 7 (seven) days. Takes on Saturdays., Disp: , Rfl:   Observations/Objective:  Weight reported be 157 lbs.  Assessment and Plan:  #1 IBS. She appears to be doing well with as needed loperamide. She can try probiotic and see if it helps with her symptoms. If LLQ abdominal pain becomes more frequent or intense would consider low-dose antispasmodic.  #2. Chronic GERD. She is doing well with famotidine.  Follow Up  Instructions:  Patient will call if famotidine stops working or if she develops dysphagia. She can try probiotic OTC 1 capsule a tablet daily. Patient will call office if diarrhea worsens in which case we will test for C. difficile since she has been on antibiotic for close to 2 weeks. Office visit in 6 months. I discussed the assessment and treatment plan with the patient. The patient was provided an opportunity to ask questions and all were answered. The patient agreed with the  plan and demonstrated an understanding of the instructions.   The patient was advised to call back or seek an in-person evaluation if the symptoms worsen or if the condition fails to improve as anticipated.  I provided 15 minutes of non-face-to-face time during this encounter.   Lionel December, MD

## 2020-05-26 DIAGNOSIS — N390 Urinary tract infection, site not specified: Secondary | ICD-10-CM | POA: Diagnosis not present

## 2020-05-26 DIAGNOSIS — N3000 Acute cystitis without hematuria: Secondary | ICD-10-CM | POA: Diagnosis not present

## 2020-05-26 DIAGNOSIS — Z20822 Contact with and (suspected) exposure to covid-19: Secondary | ICD-10-CM | POA: Diagnosis not present

## 2020-05-26 NOTE — Patient Instructions (Signed)
Patient will call if famotidine stops working.

## 2020-06-29 ENCOUNTER — Encounter: Payer: Self-pay | Admitting: Urology

## 2020-06-29 ENCOUNTER — Other Ambulatory Visit: Payer: Self-pay

## 2020-06-29 ENCOUNTER — Ambulatory Visit (INDEPENDENT_AMBULATORY_CARE_PROVIDER_SITE_OTHER): Payer: PPO | Admitting: Urology

## 2020-06-29 VITALS — BP 159/84 | HR 103 | Ht 64.0 in | Wt 150.0 lb

## 2020-06-29 DIAGNOSIS — N952 Postmenopausal atrophic vaginitis: Secondary | ICD-10-CM

## 2020-06-29 DIAGNOSIS — N390 Urinary tract infection, site not specified: Secondary | ICD-10-CM | POA: Diagnosis not present

## 2020-06-29 LAB — URINALYSIS, ROUTINE W REFLEX MICROSCOPIC
Bilirubin, UA: NEGATIVE
Glucose, UA: NEGATIVE
Ketones, UA: NEGATIVE
Nitrite, UA: NEGATIVE
Protein,UA: NEGATIVE
RBC, UA: NEGATIVE
Specific Gravity, UA: 1.01 (ref 1.005–1.030)
Urobilinogen, Ur: 0.2 mg/dL (ref 0.2–1.0)
pH, UA: 5 (ref 5.0–7.5)

## 2020-06-29 LAB — MICROSCOPIC EXAMINATION
RBC, Urine: NONE SEEN /hpf (ref 0–2)
Renal Epithel, UA: NONE SEEN /hpf
WBC, UA: >30 /hpf — AB (ref 0–5)

## 2020-06-29 MED ORDER — METHENAMINE MANDELATE 1 G PO TABS
1000.0000 mg | ORAL_TABLET | Freq: Two times a day (BID) | ORAL | 11 refills | Status: DC
Start: 2020-06-29 — End: 2020-07-12

## 2020-06-29 NOTE — Progress Notes (Signed)
H&P  Chief Complaint: Recurrent UTIs  History of Present Illness: Stephanie Wood is a 86 y.o. year old female new patient here for evaluation of her recurrent UTIs. Her first UTI presented approximately 4 months prior. She recognizes these UTIs by the associated dysuria and pain. She estimates that prior to this most recent episode, she has been treated for UTIs 10 times throughout her life. During this most recent episode she has experienced fever, chills, and nausea but she denies any associated gross hematuria. She has been with 3-4 courses of abx since November, one of which was for 30 days (sulfa, nitrofurantoin). She currently feels that she has a UTI citing slight dysuria. She has a good FOS and feels she can empty her bladder completely. She notes some fecal incontinence due to IBS. She typically wears pads for this but has discontinued out of concern for bacteria.   Past Medical History:  Diagnosis Date  . Anemia   . Colonic adenoma     Past Surgical History:  Procedure Laterality Date  . COLONOSCOPY N/A 04/15/2014   Procedure: COLONOSCOPY;  Surgeon: Rogene Houston, MD;  Location: [facility];  Service: Endoscopy;  Laterality: N/A;  730  . ESOPHAGOGASTRODUODENOSCOPY  09/20/2011   Procedure: ESOPHAGOGASTRODUODENOSCOPY (EGD);  Surgeon: Rogene Houston, MD;  Location: [facility];  Service: Endoscopy;  Laterality: N/A;  215  . EYE SURGERY     bil catatract surgery    Home Medications:  (Not in a hospital admission)   Allergies: No Known Allergies  No family history on file.  Social History:  reports that she has never smoked. She has never used smokeless tobacco. She reports that she does not drink alcohol and does not use drugs.  ROS: A complete review of systems was performed.  All systems are negative except for pertinent findings as noted.  Physical Exam:  Vital signs in last 24 hours: BP: ()/()  Arterial Line BP: ()/()  General:  Alert and oriented, No acute  distress HEENT: Normocephalic, atraumatic Neck: No JVD Cardiovascular: Regular rate Extremities: No edema Neurologic: Grossly intact  I have reviewed prior pt notes  I have reviewed notes from referring/previous physicians  I have reviewed urinalysis results    Impression/Assessment:  Recurrent UTIs - UA shows presence of bacteria. She is symptomatic and will require treatment.  Post menopausal atrophic vaginal changes  Plan:  1. Pt started on methenamine and advised regarding probiotics.  2. Urine sent for culture.  3. F/U in 3 months for OV and symptom recheck. She will call if her symptoms persist or worsen.  4. For now, no perivaginal estrogen Rx  Budd Palmer 06/29/2020, 12:16 PM  [name]. Kierre Deines MD

## 2020-06-29 NOTE — Progress Notes (Signed)
Urological Symptom Review  Patient is experiencing the following symptoms: Frequent urination Burning/pain with urination Urinary tract infection  Get up at night to urinate   Review of Systems  Gastrointestinal (upper)  : Indigestion/heartburn  Gastrointestinal (lower) : diarrhea  Constitutional : Negative for symptoms  Skin: Negative for skin symptoms  Eyes: Negative for eye symptoms  Ear/Nose/Throat : Sinus problems  Hematologic/Lymphatic: Negative for Hematologic/Lymphatic symptoms  Cardiovascular : Negative for cardiovascular symptoms  Respiratory : Negative for respiratory symptoms  Endocrine: Negative for endocrine symptoms  Musculoskeletal: Negative for musculoskeletal symptoms  Neurological: Negative for neurological symptoms  Psychologic: Depression Anxiety

## 2020-07-03 LAB — URINE CULTURE

## 2020-07-12 ENCOUNTER — Telehealth (INDEPENDENT_AMBULATORY_CARE_PROVIDER_SITE_OTHER): Payer: Self-pay | Admitting: *Deleted

## 2020-07-12 ENCOUNTER — Telehealth: Payer: Self-pay

## 2020-07-12 ENCOUNTER — Other Ambulatory Visit: Payer: Self-pay | Admitting: Urology

## 2020-07-12 DIAGNOSIS — N390 Urinary tract infection, site not specified: Secondary | ICD-10-CM

## 2020-07-12 MED ORDER — CEPHALEXIN 250 MG PO CAPS: 250.0000 mg | ORAL_CAPSULE | Freq: Three times a day (TID) | ORAL | 0 refills | Status: DC

## 2020-07-12 NOTE — Telephone Encounter (Signed)
-----   Message from Franchot Gallo, MD sent at 07/12/2020  8:47 AM EST -----  A notify patient that her urine culture was positive.  It does show a bacteria that can be found in kidney stones so I have ordered an ultrasound to be done.  Additionally, I sent in a prescription for cephalexin.  Have her start that.  While on cephalexin she can stop the methenamine, and restart the methenamine when she has completed the cephalexin. ----- Message ----- From: Iris Pert, LPN Sent: 08/02/4449   8:59 AM EST To: Franchot Gallo, MD  Please review

## 2020-07-12 NOTE — Telephone Encounter (Signed)
Stephanie Wood states that she has seen a Dealer and he has put her on Methenamine HIPP 1 Gram she is to take 1 by mouth twice a day. She started this on June 29 2020. She was advised at that time that she would need to take a probiotic for the first 5-8 days. She also has been eating yogurt at bedtime. She has noticed that she has only had to take Imodium twice in the 2 weeks.  They called her today and told her they were sending her Cephalexin 250 mg - take 1 by mouth three times a day for 5- 7 days, then she will go back on the Methenamine.  Her questions are. Should she continue to take the probiotic and eat the yogurt or just eat the yogurt? Also , her having been taking the Imodium for long periods of time should she be concerned about any side effects long term.

## 2020-07-12 NOTE — Telephone Encounter (Signed)
Made pt aware of positive culture result and new med  And to stop methenamine and restart after finishing Keflex. To get an ultrasound done. They will call to schedule.

## 2020-07-13 DIAGNOSIS — I10 Essential (primary) hypertension: Secondary | ICD-10-CM | POA: Diagnosis not present

## 2020-07-13 DIAGNOSIS — N189 Chronic kidney disease, unspecified: Secondary | ICD-10-CM | POA: Diagnosis not present

## 2020-07-13 DIAGNOSIS — F419 Anxiety disorder, unspecified: Secondary | ICD-10-CM | POA: Diagnosis not present

## 2020-07-13 DIAGNOSIS — J309 Allergic rhinitis, unspecified: Secondary | ICD-10-CM | POA: Diagnosis not present

## 2020-07-14 NOTE — Telephone Encounter (Signed)
Per Dr.Rehman the patient may take one or the other. Either the Probiotic my mouth daily or she may eat the Align Yogurt at bedtime. As far as the concern about having taken the Imodium for long periods of time, should she be concerned about any long term effects. The side effects are low. If any thing it would be constipation. Patient was called and given Dr.Rehman's recommendations. Stephanie Wood verbalized understanding.

## 2020-07-16 DIAGNOSIS — Z8249 Family history of ischemic heart disease and other diseases of the circulatory system: Secondary | ICD-10-CM | POA: Diagnosis not present

## 2020-07-16 DIAGNOSIS — I10 Essential (primary) hypertension: Secondary | ICD-10-CM | POA: Diagnosis not present

## 2020-07-16 DIAGNOSIS — E782 Mixed hyperlipidemia: Secondary | ICD-10-CM | POA: Diagnosis not present

## 2020-07-16 DIAGNOSIS — M810 Age-related osteoporosis without current pathological fracture: Secondary | ICD-10-CM | POA: Diagnosis not present

## 2020-07-16 DIAGNOSIS — R002 Palpitations: Secondary | ICD-10-CM | POA: Diagnosis not present

## 2020-07-19 ENCOUNTER — Ambulatory Visit: Payer: PPO | Admitting: Urology

## 2020-07-22 ENCOUNTER — Telehealth (INDEPENDENT_AMBULATORY_CARE_PROVIDER_SITE_OTHER): Payer: Self-pay | Admitting: Internal Medicine

## 2020-07-22 NOTE — Telephone Encounter (Signed)
I reviewed patient's blood work from July 16, 2020 from Dr. Vickey Sages office CBC and LFTs are normal She is aware that her creatinine is elevated.  She is seeing a urologist and scheduled for ultrasound. Patient says she is doing very well as far as diarrhea is concerned she only has taken 2 Imodium's in the last 30 days.  She is taking probiotic.

## 2020-07-23 ENCOUNTER — Other Ambulatory Visit: Payer: Self-pay

## 2020-07-23 ENCOUNTER — Ambulatory Visit (HOSPITAL_COMMUNITY)
Admission: RE | Admit: 2020-07-23 | Discharge: 2020-07-23 | Disposition: A | Discharge status: Home / Self Care | Payer: PPO | Source: Ambulatory Visit | Attending: Urology | Admitting: Urology

## 2020-07-23 DIAGNOSIS — N281 Cyst of kidney, acquired: Secondary | ICD-10-CM | POA: Diagnosis not present

## 2020-07-23 DIAGNOSIS — N390 Urinary tract infection, site not specified: Secondary | ICD-10-CM | POA: Insufficient documentation

## 2020-07-26 ENCOUNTER — Ambulatory Visit (INDEPENDENT_AMBULATORY_CARE_PROVIDER_SITE_OTHER): Payer: PPO | Admitting: Gastroenterology

## 2020-07-27 NOTE — Progress Notes (Signed)
Sent via mail 

## 2020-07-28 ENCOUNTER — Telehealth: Payer: Self-pay

## 2020-07-28 NOTE — Telephone Encounter (Signed)
Patient calling to get results from recent tests done.  Please call patient back at [phone].  Thanks, [name]

## 2020-07-29 NOTE — Telephone Encounter (Signed)
Patient called and made aware.

## 2020-07-30 ENCOUNTER — Encounter (INDEPENDENT_AMBULATORY_CARE_PROVIDER_SITE_OTHER): Payer: Self-pay

## 2020-08-25 DIAGNOSIS — H353131 Nonexudative age-related macular degeneration, bilateral, early dry stage: Secondary | ICD-10-CM | POA: Diagnosis not present

## 2020-08-25 DIAGNOSIS — H40013 Open angle with borderline findings, low risk, bilateral: Secondary | ICD-10-CM | POA: Diagnosis not present

## 2020-08-25 DIAGNOSIS — Z961 Presence of intraocular lens: Secondary | ICD-10-CM | POA: Diagnosis not present

## 2020-08-25 DIAGNOSIS — H04123 Dry eye syndrome of bilateral lacrimal glands: Secondary | ICD-10-CM | POA: Diagnosis not present

## 2020-08-25 DIAGNOSIS — D3131 Benign neoplasm of right choroid: Secondary | ICD-10-CM | POA: Diagnosis not present

## 2020-09-15 DIAGNOSIS — I10 Essential (primary) hypertension: Secondary | ICD-10-CM | POA: Diagnosis not present

## 2020-09-15 DIAGNOSIS — F419 Anxiety disorder, unspecified: Secondary | ICD-10-CM | POA: Diagnosis not present

## 2020-09-15 DIAGNOSIS — M545 Low back pain, unspecified: Secondary | ICD-10-CM | POA: Diagnosis not present

## 2020-09-15 DIAGNOSIS — J309 Allergic rhinitis, unspecified: Secondary | ICD-10-CM | POA: Diagnosis not present

## 2020-09-28 ENCOUNTER — Ambulatory Visit (INDEPENDENT_AMBULATORY_CARE_PROVIDER_SITE_OTHER): Payer: PPO | Admitting: Urology

## 2020-09-28 ENCOUNTER — Encounter: Payer: Self-pay | Admitting: Urology

## 2020-09-28 ENCOUNTER — Other Ambulatory Visit: Payer: Self-pay

## 2020-09-28 VITALS — BP 145/78 | HR 84

## 2020-09-28 DIAGNOSIS — N390 Urinary tract infection, site not specified: Secondary | ICD-10-CM

## 2020-09-28 DIAGNOSIS — N281 Cyst of kidney, acquired: Secondary | ICD-10-CM | POA: Diagnosis not present

## 2020-09-28 DIAGNOSIS — N952 Postmenopausal atrophic vaginitis: Secondary | ICD-10-CM

## 2020-09-28 LAB — URINALYSIS, ROUTINE W REFLEX MICROSCOPIC
Bilirubin, UA: NEGATIVE
Glucose, UA: NEGATIVE
Ketones, UA: NEGATIVE
Leukocytes,UA: NEGATIVE
Nitrite, UA: NEGATIVE
Protein,UA: NEGATIVE
RBC, UA: NEGATIVE
Specific Gravity, UA: <1.005 — ABNORMAL LOW (ref 1.005–1.030)
Urobilinogen, Ur: 0.2 mg/dL (ref 0.2–1.0)
pH, UA: 5.5 (ref 5.0–7.5)

## 2020-09-28 NOTE — Progress Notes (Signed)
History of Present Illness: Here for followup of recurrent UTIs.  2.1.2022: 86 y.o. year old female new patient here for evaluation of her recurrent UTIs. Her first UTI presented approximately 4 months prior. She recognizes these UTIs by the associated dysuria and pain. She estimates that prior to this most recent episode, she has been treated for UTIs 10 times throughout her life. During this most recent episode she has experienced fever, chills, and nausea but she denies any associated gross hematuria. She has been with 3-4 courses of abx since November, one of which was for 30 days (sulfa, nitrofurantoin). She currently feels that she has a UTI citing slight dysuria. She has a good FOS and feels she can empty her bladder completely. She notes some fecal incontinence due to IBS. She typically wears pads for this but has discontinued out of concern for bacteria.  Urine C&S + for Proteus. Renal U/S nml except for renal cyst.  She was placed on methenamine as well as probiotics.  5.3.2022: She denies recent issues with dysuria, frequency or urgency.  She has tolerated the Methenamine  well.  Past Medical History:  Diagnosis Date  . Acid reflux disease   . Anemia   . Anxiety   . Colonic adenoma   . Depression   . Irritable bowel syndrome     Past Surgical History:  Procedure Laterality Date  . CATARACT EXTRACTION    . COLONOSCOPY N/A 04/15/2014   Procedure: COLONOSCOPY;  Surgeon: Rogene Houston, MD;  Location: [facility];  Service: Endoscopy;  Laterality: N/A;  730  . ESOPHAGOGASTRODUODENOSCOPY  09/20/2011   Procedure: ESOPHAGOGASTRODUODENOSCOPY (EGD);  Surgeon: Rogene Houston, MD;  [facility]: [facility];  Service: Endoscopy;  Laterality: N/A;  215  . EYE SURGERY     bil catatract surgery    Home Medications:  (Not in a hospital admission)   Allergies:  Allergies  Allergen Reactions  . Drug Class [Trazodone And Nefazodone]   . No Known Allergies     Family History   Problem Relation Age of Onset  . Prostate cancer Father     Social History:  reports that she has never smoked. She has never used smokeless tobacco. She reports that she does not drink alcohol and does not use drugs.  ROS: A complete review of systems was performed.  All systems are negative except for pertinent findings as noted.  Physical Exam:  Vital signs in last 24 hours: @VSRANGES @ General:  Alert and oriented, No acute distress HEENT: Normocephalic, atraumatic Neck: No JVD or lymphadenopathy Cardiovascular: Regular rate  Lungs: Normal inspiratory/expiratory excursion Extremities: No edema Neurologic: Grossly intact  I have reviewed prior pt notes  I have reviewed urinalysis results--urine looks clear  I have independently reviewed prior imaging--renal U/S  I have reviewed prior urine culture  Impression/Assessment:  History of recurrent cystitis in this woman who now has clear urine and minimal symptoms.  Initial culture grew Proteus, upper tracts appear normal.  Plan:  She will continue the bacteriostatic methenamine  I will see her back in a year  Jorja Loa 09/28/2020, 8:06 AM  [name]. Celisa Schoenberg MD

## 2020-09-28 NOTE — Progress Notes (Signed)

## 2020-10-05 ENCOUNTER — Telehealth (INDEPENDENT_AMBULATORY_CARE_PROVIDER_SITE_OTHER): Payer: Self-pay | Admitting: Internal Medicine

## 2020-10-05 ENCOUNTER — Other Ambulatory Visit (INDEPENDENT_AMBULATORY_CARE_PROVIDER_SITE_OTHER): Payer: Self-pay

## 2020-10-05 NOTE — Telephone Encounter (Signed)
I spoke with the patient she states she had two normal bm's yesterday,followed by two bouts of diarrhea.When she wiped seen a small amount of bright red blood.She states she wiped herself raw and could not stand to touch the area. She states she has a history of hemorrhoids. She took an imodium last night and has not had another bm nor has she seen any blood since last night. She states she has only had abdominal pain once this week and it was located in the lower left side of abdomin. She is not having and fevers. Has a history of IBS D and takes imodium prn.

## 2020-10-05 NOTE — Telephone Encounter (Signed)
Patient called stated saw some blood in her stool this morning - please advise - ph# [phone]

## 2020-10-06 ENCOUNTER — Other Ambulatory Visit (INDEPENDENT_AMBULATORY_CARE_PROVIDER_SITE_OTHER): Payer: Self-pay

## 2020-10-06 DIAGNOSIS — K580 Irritable bowel syndrome with diarrhea: Secondary | ICD-10-CM

## 2020-10-06 DIAGNOSIS — K6289 Other specified diseases of anus and rectum: Secondary | ICD-10-CM

## 2020-10-06 MED ORDER — HYDROCORTISONE (PERIANAL) 2.5 % EX CREA: 1.0000 "application " | TOPICAL_CREAM | Freq: Two times a day (BID) | CUTANEOUS | 1 refills | Status: DC

## 2020-10-06 NOTE — Telephone Encounter (Signed)
Patient aware Dr. Laural Golden has sent in a cream for her to [facility].

## 2020-10-15 ENCOUNTER — Encounter: Payer: Self-pay | Admitting: Emergency Medicine

## 2020-10-15 ENCOUNTER — Ambulatory Visit
Admission: EM | Admit: 2020-10-15 | Discharge: 2020-10-15 | Disposition: A | Discharge status: Home / Self Care | Payer: PPO

## 2020-10-15 ENCOUNTER — Other Ambulatory Visit: Payer: Self-pay

## 2020-10-15 DIAGNOSIS — W57XXXA Bitten or stung by nonvenomous insect and other nonvenomous arthropods, initial encounter: Secondary | ICD-10-CM | POA: Diagnosis not present

## 2020-10-15 DIAGNOSIS — S80869A Insect bite (nonvenomous), unspecified lower leg, initial encounter: Secondary | ICD-10-CM

## 2020-10-15 NOTE — Discharge Instructions (Signed)
Tick removed in office To prevent tick bites, wear long sleeves, long pants, and light colors. Use insect repellent. Follow the instructions on the bottle. If the tick is biting, do not try to remove it with heat, alcohol, petroleum jelly, or fingernail polish. Use tweezers, curved forceps, or a tick-removal tool to grasp the tick. Gently pull up until the tick lets go. Do not twist or jerk the tick. Do not squeeze or crush the tick. Return here or go to ER if you have any new or worsening symptoms (rash, nausea, vomiting, fever, chills, headache, fatigue)

## 2020-10-15 NOTE — ED Triage Notes (Signed)
States she has a tick embedded in her right lower leg x 2 days.

## 2020-10-15 NOTE — ED Provider Notes (Signed)
[place]   [id number] 10/15/20 Arrival Time: 1540  CC: Tick bite  SUBJECTIVE:  Stephanie Wood is a 86 y.o. female who presents with a tick embedded into RT lower leg x 2 days.  Tried removing at home without relief.  Reports similar symptoms in the past. Denies fever, chills, nausea, vomiting, headache, dizziness, weakness, fatigue, rash, or abdominal pain.    ROS: As per HPI.  All other pertinent ROS negative.     Past Medical History:  Diagnosis Date  . Acid reflux disease   . Anemia   . Anxiety   . Colonic adenoma   . Depression   . Irritable bowel syndrome    Past Surgical History:  Procedure Laterality Date  . CATARACT EXTRACTION    . COLONOSCOPY N/A 04/15/2014   Procedure: COLONOSCOPY;  Surgeon: Rogene Houston, MD;  [facility]: [facility];  Service: Endoscopy;  Laterality: N/A;  730  . ESOPHAGOGASTRODUODENOSCOPY  09/20/2011   Procedure: ESOPHAGOGASTRODUODENOSCOPY (EGD);  Surgeon: Rogene Houston, MD;  [facility];  Service: Endoscopy;  Laterality: N/A;  215  . EYE SURGERY     bil catatract surgery   Allergies  Allergen Reactions  . Drug Class [Trazodone And Nefazodone]   . No Known Allergies    No current facility-administered medications on file prior to encounter.   Current Outpatient Medications on File Prior to Encounter  Medication Sig Dispense Refill  . ALPRAZolam (XANAX) 0.5 MG tablet Take 0.5 mg by mouth at bedtime as needed. Sleep    . atorvastatin (LIPITOR) 20 MG tablet Take 20 mg by mouth daily.    . Calcium Carbonate (CALCIUM 600 PO) Take by mouth daily.    Marland Kitchen ELDERBERRY PO Take by mouth daily.    . famotidine (PEPCID) 20 MG tablet Take 20 mg by mouth 2 (two) times daily.    Marland Kitchen FLUTICASONE PROPIONATE, NASAL, NA Place into the nose daily.    . hydrocortisone (ANUSOL-HC) 2.5 % rectal cream Place 1 application rectally 2 (two) times daily. 30 g 1  . ipratropium (ATROVENT) 0.06 % nasal spray Place 2 sprays into both nostrils 2 (two)  times daily.    Marland Kitchen loperamide (IMODIUM) 2 MG capsule Take 2 mg by mouth daily. Patient states that she takes 1/2 -1 daily. 30 capsule   . loratadine (CLARITIN) 10 MG tablet Take 10 mg by mouth daily.    . methenamine (HIPREX) 1 g tablet Take 1 g by mouth 2 (two) times daily.    . Pediatric Multivitamins-Iron (FLINTSTONES PLUS IRON) chewable tablet Chew 1 tablet by mouth 2 (two) times a week.    Marland Kitchen PROLIA 60 MG/ML SOSY injection SMARTSIG:SUB-Q Twice a Year    . vitamin B-12 (CYANOCOBALAMIN) 1000 MCG tablet Take 1,000 mcg by mouth daily.    . Vitamin D, Ergocalciferol, (DRISDOL) 50000 UNITS CAPS capsule Take 50,000 Units by mouth every 7 (seven) days. Takes on Saturdays.    Marland Kitchen zolpidem (AMBIEN) 5 MG tablet Take 5 mg by mouth at bedtime.     Social History   Socioeconomic History  . Marital status: Widowed    Spouse name: Not on file  . Number of children: 1  . Years of education: Not on file  . Highest education level: Not on file  Occupational History  . Occupation: reitred  Tobacco Use  . Smoking status: Never Smoker  . Smokeless tobacco: Never Used  Substance and Sexual Activity  . Alcohol use: No    Alcohol/week:  0.0 standard drinks  . Drug use: No  . Sexual activity: Not on file  Other Topics Concern  . Not on file  Social History Narrative  . Not on file   Social Determinants of Health   Financial Resource Strain: Not on file  Food Insecurity: Not on file  Transportation Needs: Not on file  Physical Activity: Not on file  Stress: Not on file  Social Connections: Not on file  Intimate Partner Violence: Not on file   Family History  Problem Relation Age of Onset  . Prostate cancer Father     OBJECTIVE: Vitals:   10/15/20 1413  BP: (!) 162/91  Pulse: 84  Resp: 18  Temp: 98.2 F (36.8 C)  TempSrc: Oral  SpO2: 92%    General appearance: alert; no distress Head: NCAT Lungs: normal respiratory effort Extremities: no edema Skin: warm and dry; area of erythema  with small tick to anterior proximal lower leg, no obvious drainage or bleeding Psychological: alert and cooperative; normal mood and affect  ASSESSMENT & PLAN:  1. Tick bite of lower leg, initial encounter    Tick removed in office To prevent tick bites, wear long sleeves, long pants, and light colors. Use insect repellent. Follow the instructions on the bottle. If the tick is biting, do not try to remove it with heat, alcohol, petroleum jelly, or fingernail polish. Use tweezers, curved forceps, or a tick-removal tool to grasp the tick. Gently pull up until the tick lets go. Do not twist or jerk the tick. Do not squeeze or crush the tick. Return here or go to ER if you have any new or worsening symptoms (rash, nausea, vomiting, fever, chills, headache, fatigue)   Reviewed expectations re: course of current medical issues. Questions answered. Outlined signs and symptoms indicating need for more acute intervention. Patient verbalized understanding. After Visit Summary given.   Lestine Box, PA-C 10/15/20 1438

## 2020-11-03 DIAGNOSIS — J309 Allergic rhinitis, unspecified: Secondary | ICD-10-CM | POA: Diagnosis not present

## 2020-11-03 DIAGNOSIS — I10 Essential (primary) hypertension: Secondary | ICD-10-CM | POA: Diagnosis not present

## 2020-11-03 DIAGNOSIS — M545 Low back pain, unspecified: Secondary | ICD-10-CM | POA: Diagnosis not present

## 2020-11-03 DIAGNOSIS — F419 Anxiety disorder, unspecified: Secondary | ICD-10-CM | POA: Diagnosis not present

## 2020-11-12 DIAGNOSIS — M545 Low back pain, unspecified: Secondary | ICD-10-CM | POA: Diagnosis not present

## 2020-11-12 DIAGNOSIS — F419 Anxiety disorder, unspecified: Secondary | ICD-10-CM | POA: Diagnosis not present

## 2020-11-23 ENCOUNTER — Encounter (INDEPENDENT_AMBULATORY_CARE_PROVIDER_SITE_OTHER): Payer: Self-pay | Admitting: Internal Medicine

## 2020-11-23 ENCOUNTER — Ambulatory Visit (INDEPENDENT_AMBULATORY_CARE_PROVIDER_SITE_OTHER): Payer: PPO | Admitting: Internal Medicine

## 2020-11-23 ENCOUNTER — Other Ambulatory Visit: Payer: Self-pay

## 2020-11-23 VITALS — BP 144/78 | HR 85 | Temp 97.7°F | Ht 64.0 in | Wt 158.0 lb

## 2020-11-23 DIAGNOSIS — K580 Irritable bowel syndrome with diarrhea: Secondary | ICD-10-CM

## 2020-11-23 DIAGNOSIS — K219 Gastro-esophageal reflux disease without esophagitis: Secondary | ICD-10-CM | POA: Diagnosis not present

## 2020-11-23 NOTE — Patient Instructions (Signed)
Notify if imodium stops working

## 2020-11-23 NOTE — Progress Notes (Signed)
Presenting complaint;  Follow-up for IBS and GERD.  Database and subjective:  Patient is 86 year old Caucasian female who has a history of IBS with diarrhea as well as GERD who is here for scheduled visit.  She had virtual visit in December 2021 and face-to-face visit 1 year ago. She states she is doing well.  She rarely has heartburn.  Famotidine is working.  She denies dysphagia nausea or vomiting.  She states she is doing well as far as her diarrhea is concerned.  She has been keeping track of how often she takes Imodium.  She states she has taken 23 doses in 5 months.  She generally takes Imodium when she has to leave her house.  On most days she has 2-3 bowel movements.  She may have 1 loose stool a week.  She has not had any accidents and has occasional nocturnal bowel movement.  She denies melena or frank rectal bleeding.  She may see blood on the tissue once in a while.  Her appetite is good and she has gained 8 pounds since her last visit. She lost brother recently of [place].  He was 50 years old.  6 siblings have died and she only has 1 sister who lives in [place].  Current Medications: Outpatient Encounter Medications as of 11/23/2020  Medication Sig   ALPRAZolam (XANAX) 0.5 MG tablet Take 0.5 mg by mouth at bedtime as needed. Sleep   atorvastatin (LIPITOR) 20 MG tablet Take 20 mg by mouth daily.   bifidobacterium infantis (ALIGN) capsule Take 1 capsule by mouth daily.   Calcium Carbonate (CALCIUM 600 PO) Take by mouth daily.   ELDERBERRY PO Take by mouth daily.   famotidine (PEPCID) 20 MG tablet Take 20 mg by mouth 2 (two) times daily.   FLUTICASONE PROPIONATE, NASAL, NA Place into the nose daily.   ipratropium (ATROVENT) 0.06 % nasal spray Place 2 sprays into both nostrils 2 (two) times daily.   loperamide (IMODIUM) 2 MG capsule Take 2 mg by mouth daily. Patient states that she takes 1/2 -1 daily.   loratadine (CLARITIN) 10 MG tablet Take 10 mg by mouth daily.   methenamine  (HIPREX) 1 g tablet Take 1 g by mouth 2 (two) times daily.   Pediatric Multivitamins-Iron (FLINTSTONES PLUS IRON) chewable tablet Chew 1 tablet by mouth 2 (two) times a week.   PROLIA 60 MG/ML SOSY injection SMARTSIG:SUB-Q Twice a Year   vitamin B-12 (CYANOCOBALAMIN) 1000 MCG tablet Take 1,000 mcg by mouth daily.   Vitamin D, Ergocalciferol, (DRISDOL) 50000 UNITS CAPS capsule Take 50,000 Units by mouth every 7 (seven) days. Takes on Saturdays.   [DISCONTINUED] hydrocortisone (ANUSOL-HC) 2.5 % rectal cream Place 1 application rectally 2 (two) times daily.   [DISCONTINUED] zolpidem (AMBIEN) 5 MG tablet Take 5 mg by mouth at bedtime.   No facility-administered encounter medications on file as of 11/23/2020.     Objective: Blood pressure (!) 144/78, pulse 85, temperature 97.7 F (36.5 C), temperature source Oral, height 5\' 4"  (1.626 m), weight 158 lb (71.7 kg). Patient is alert and in no acute distress. She is wearing a mask. Conjunctiva is pink. Sclera is nonicteric Oropharyngeal mucosa is normal. No neck masses or thyromegaly noted. Cardiac exam with regular rhythm normal S1 and S2. No murmur or gallop noted. Lungs are clear to auscultation. Abdomen is full but soft and nontender with organomegaly or masses. No LE edema or clubbing noted.  Assessment:  #1.  IBS/diarrhea.  She is doing well with as needed  loperamide and dietary changes.  I believe decrease in stress may have helped.  She remains without alarm symptoms.  #2.  Chronic GERD.  Last EGD was in April 2013.  No history of Barrett's.  Symptoms are well controlled with histamine 2 blocker.   Plan:  Continue loperamide 2 mg daily as needed. Continue famotidine 20 mg p.o. twice daily. Office visit in 1 year unless needs medicine stopped working.

## 2020-12-30 ENCOUNTER — Telehealth: Payer: Self-pay

## 2020-12-30 NOTE — Telephone Encounter (Signed)
Patient complaining of UTI symptoms. Request to have urine checked. Can patient drop off specimen?

## 2021-01-03 ENCOUNTER — Telehealth: Payer: Self-pay

## 2021-01-03 NOTE — Telephone Encounter (Signed)
Patient needing a call back from a nurse to discuss UTI. Has been waiting on a call back last week.  Please advise.  Call back:  [phone]  Thanks, [name]

## 2021-01-03 NOTE — Telephone Encounter (Signed)
Appt scheduled tomorrow with Dr. Diona Fanti. Patient aware of appt.

## 2021-01-04 ENCOUNTER — Ambulatory Visit (INDEPENDENT_AMBULATORY_CARE_PROVIDER_SITE_OTHER): Payer: PPO | Admitting: Urology

## 2021-01-04 ENCOUNTER — Encounter: Payer: Self-pay | Admitting: Urology

## 2021-01-04 ENCOUNTER — Other Ambulatory Visit: Payer: Self-pay

## 2021-01-04 VITALS — BP 142/85 | HR 98 | Ht 64.0 in | Wt 159.5 lb

## 2021-01-04 DIAGNOSIS — N390 Urinary tract infection, site not specified: Secondary | ICD-10-CM | POA: Diagnosis not present

## 2021-01-04 LAB — MICROSCOPIC EXAMINATION
Casts: NONE SEEN /lpf
Renal Epithel, UA: NONE SEEN /hpf
WBC, UA: >30 /hpf — AB (ref 0–5)

## 2021-01-04 LAB — URINALYSIS, ROUTINE W REFLEX MICROSCOPIC
Bilirubin, UA: NEGATIVE
Glucose, UA: NEGATIVE
Ketones, UA: NEGATIVE
Nitrite, UA: NEGATIVE
Protein,UA: NEGATIVE
Specific Gravity, UA: <1.005 — ABNORMAL LOW (ref 1.005–1.030)
Urobilinogen, Ur: 0.2 mg/dL (ref 0.2–1.0)
pH, UA: 5.5 (ref 5.0–7.5)

## 2021-01-04 MED ORDER — NITROFURANTOIN MONOHYD MACRO 100 MG PO CAPS: 100.0000 mg | ORAL_CAPSULE | Freq: Two times a day (BID) | ORAL | 0 refills | Status: DC

## 2021-01-04 NOTE — Progress Notes (Signed)
Urological Symptom Review  Patient is experiencing the following symptoms: Frequent urination Burning/pain with urination Get up at night to urinate Urinary tract infection   Review of Systems  Gastrointestinal (upper)  : Negative for upper GI symptoms  Gastrointestinal (lower) : IBS  Constitutional : Negative for symptoms  Skin: Negative for skin symptoms  Eyes: Negative for eye symptoms  Ear/Nose/Throat : Sinus problems  Hematologic/Lymphatic: Negative for Hematologic/Lymphatic symptoms  Cardiovascular : Negative for cardiovascular symptoms  Respiratory : Negative for respiratory symptoms  Endocrine: Negative for endocrine symptoms  Musculoskeletal: Back pain Joint pain  Neurological: Negative for neurological symptoms  Psychologic: Depression

## 2021-01-04 NOTE — Progress Notes (Signed)
History of Present Illness: Stephanie Wood is a 86 y.o. year old female for followup.  2.1.2022: 86 y.o. year old female new patient here for evaluation of her recurrent UTIs. Her first UTI presented approximately 4 months prior. She recognizes these UTIs by the associated dysuria and pain. She estimates that prior to this most recent episode, she has been treated for UTIs 10 times throughout her life. During this most recent episode she has experienced fever, chills, and nausea but she denies any associated gross hematuria. She has been with 3-4 courses of abx since November, one of which was for 30 days (sulfa, nitrofurantoin). She currently feels that she has a UTI citing slight dysuria. She has a good FOS and feels she can empty her bladder completely. She notes some fecal incontinence due to IBS. She typically wears pads for this but has discontinued out of concern for bacteria.   Urine C&S + for Proteus. Renal U/S nml except for renal cyst.   She was placed on methenamine as well as probiotics.   5.3.2022: She denies recent issues with dysuria, frequency or urgency.  She has tolerated the Methenamine  well.  8.9.2022: Over the past 2 weeks she has had some dysuria and more frequent nation.  No fever, chills, no gross hematuria.  She has had a fairly quiescent episode since she started methenamine    Past Medical History:  Diagnosis Date   Acid reflux disease    Anemia    Anxiety    Colonic adenoma    Depression    Irritable bowel syndrome     Past Surgical History:  Procedure Laterality Date   CATARACT EXTRACTION     COLONOSCOPY N/A 04/15/2014   Procedure: COLONOSCOPY;  Surgeon: Rogene Houston, MD;  Location: [facility];  Service: Endoscopy;  Laterality: N/A;  730   ESOPHAGOGASTRODUODENOSCOPY  09/20/2011   Procedure: ESOPHAGOGASTRODUODENOSCOPY (EGD);  Surgeon: Rogene Houston, MD;  Location: [facility];  Service: Endoscopy;  Laterality: N/A;  215   EYE SURGERY     bil  catatract surgery    Home Medications:  (Not in a hospital admission)   Allergies:  Allergies  Allergen Reactions   Drug Class [Trazodone And Nefazodone]    No Known Allergies     Family History  Problem Relation Age of Onset   Prostate cancer Father     Social History:  reports that she has never smoked. She has never used smokeless tobacco. She reports that she does not drink alcohol and does not use drugs.  ROS: A complete review of systems was performed.  All systems are negative except for pertinent findings as noted.  Physical Exam:  Vital signs in last 24 hours: '@VSRANGES'$ @ General:  Alert and oriented, No acute distress HEENT: Normocephalic, atraumatic Neck: No JVD or lymphadenopathy Cardiovascular: Regular rate  Lungs: Normal inspiratory/expiratory excursion Extremities: No edema Neurologic: Grossly intact  I have reviewed prior pt notes  I have reviewed urinalysis results  I have reviewed prior urine culture   Impression/Assessment:  Recurrent UTI  Plan:  1.  Continue methenamine after this current antibiotic is complete  2.  I started her on Macrobid  3.  Urine was cultured  4.  Continue scheduled appointment in May of next year.  Stephanie Wood 01/04/2021, 12:23 PM  Stephanie Boxer. Janzen Sacks MD

## 2021-01-06 LAB — URINE CULTURE

## 2021-01-07 ENCOUNTER — Ambulatory Visit
Admission: EM | Admit: 2021-01-07 | Discharge: 2021-01-07 | Disposition: A | Discharge status: Home / Self Care | Payer: PPO | Attending: Emergency Medicine | Admitting: Emergency Medicine

## 2021-01-07 ENCOUNTER — Encounter: Payer: Self-pay | Admitting: Emergency Medicine

## 2021-01-07 ENCOUNTER — Other Ambulatory Visit: Payer: Self-pay

## 2021-01-07 ENCOUNTER — Ambulatory Visit: Payer: Self-pay

## 2021-01-07 DIAGNOSIS — N390 Urinary tract infection, site not specified: Secondary | ICD-10-CM

## 2021-01-07 DIAGNOSIS — R5081 Fever presenting with conditions classified elsewhere: Secondary | ICD-10-CM

## 2021-01-07 MED ORDER — ONDANSETRON HCL 4 MG PO TABS: 4.0000 mg | ORAL_TABLET | Freq: Four times a day (QID) | ORAL | 0 refills | Status: DC

## 2021-01-07 MED ORDER — CEFTRIAXONE SODIUM 500 MG IJ SOLR
500.0000 mg | Freq: Once | INTRAMUSCULAR | Status: AC
  Administered 2021-01-07: 500 mg via INTRAMUSCULAR

## 2021-01-07 MED ORDER — ONDANSETRON 4 MG PO TBDP
4.0000 mg | ORAL_TABLET | Freq: Once | ORAL | Status: AC
  Administered 2021-01-07: 4 mg via ORAL

## 2021-01-07 NOTE — Discharge Instructions (Addendum)
Rocephin injection given in office Zofran given in office Resume macrobid today Push fluids and get plenty of rest.   Take antibiotic as directed and to completion Zofran prescribed Follow up with urologist for further evaluation and management Return here or go to ER if you have any new or worsening symptoms such as fever, worsening abdominal pain, nausea/vomiting, flank pain, etc..Marland Kitchen

## 2021-01-07 NOTE — ED Provider Notes (Signed)
[facility]   CC: Fever   SUBJECTIVE:  Stephanie Wood is a 86 y.o. female who complains of fever, 102 at night, and fatigue x 3 days.  Currently being treated for UTI with macrobid, however, has not been unable to keep medicine down due to nausea.  Has taken maybe 3 doses.  Reports hx of recurrent UTIs.  Urine culture showed macrobid was effective for treatment of current UTI.  Admits to similar symptoms in the past.  Denies fever, chills, nausea, vomiting, abdominal pain, flank pain, hematuria.    LMP: No LMP recorded. Patient is postmenopausal.  ROS: As in HPI.  All other pertinent ROS negative.     Past Medical History:  Diagnosis Date   Acid reflux disease    Anemia    Anxiety    Colonic adenoma    Depression    Irritable bowel syndrome    Past Surgical History:  Procedure Laterality Date   CATARACT EXTRACTION     COLONOSCOPY N/A 04/15/2014   Procedure: COLONOSCOPY;  Surgeon: Rogene Houston, MD;  Location: [facility];  Service: Endoscopy;  Laterality: N/A;  730   ESOPHAGOGASTRODUODENOSCOPY  09/20/2011   Procedure: ESOPHAGOGASTRODUODENOSCOPY (EGD);  Surgeon: Rogene Houston, MD;  [facility]: [facility];  Service: Endoscopy;  Laterality: N/A;  215   EYE SURGERY     bil catatract surgery   Allergies  Allergen Reactions   Drug Class [Trazodone And Nefazodone]    No Known Allergies    No current facility-administered medications on file prior to encounter.   Current Outpatient Medications on File Prior to Encounter  Medication Sig Dispense Refill   ALPRAZolam (XANAX) 0.5 MG tablet Take 0.5 mg by mouth at bedtime as needed. Sleep     atorvastatin (LIPITOR) 20 MG tablet Take 20 mg by mouth daily.     bifidobacterium infantis (ALIGN) capsule Take 1 capsule by mouth daily.     Calcium Carbonate (CALCIUM 600 PO) Take by mouth daily.     ELDERBERRY PO Take by mouth daily.     famotidine (PEPCID) 20 MG tablet Take 20 mg by mouth 2 (two) times daily.      FLUTICASONE PROPIONATE, NASAL, NA Place into the nose daily.     ipratropium (ATROVENT) 0.06 % nasal spray Place 2 sprays into both nostrils 2 (two) times daily.     loperamide (IMODIUM) 2 MG capsule Take 2 mg by mouth daily. Patient states that she takes 1/2 -1 daily. 30 capsule    loratadine (CLARITIN) 10 MG tablet Take 10 mg by mouth daily.     methenamine (HIPREX) 1 g tablet Take 1 g by mouth 2 (two) times daily.     nitrofurantoin, macrocrystal-monohydrate, (MACROBID) 100 MG capsule Take 1 capsule (100 mg total) by mouth 2 (two) times daily for 7 days. 10 capsule 0   Pediatric Multivitamins-Iron (FLINTSTONES PLUS IRON) chewable tablet Chew 1 tablet by mouth 2 (two) times a week.     PROLIA 60 MG/ML SOSY injection SMARTSIG:SUB-Q Twice a Year     vitamin B-12 (CYANOCOBALAMIN) 1000 MCG tablet Take 1,000 mcg by mouth daily.     Vitamin D, Ergocalciferol, (DRISDOL) 50000 UNITS CAPS capsule Take 50,000 Units by mouth every 7 (seven) days. Takes on Saturdays.     Social History   Socioeconomic History   Marital status: Widowed    Spouse name: Not on file   Number of children: 1   Years of education: Not on file  Highest education level: Not on file  Occupational History   Occupation: reitred  Tobacco Use   Smoking status: Never   Smokeless tobacco: Never  Substance and Sexual Activity   Alcohol use: No    Alcohol/week: 0.0 standard drinks   Drug use: No   Sexual activity: Not on file  Other Topics Concern   Not on file  Social History Narrative   Not on file   Social Determinants of Health   Financial Resource Strain: Not on file  Food Insecurity: Not on file  Transportation Needs: Not on file  Physical Activity: Not on file  Stress: Not on file  Social Connections: Not on file  Intimate Partner Violence: Not on file   Family History  Problem Relation Age of Onset   Prostate cancer Father     OBJECTIVE:  Vitals:   01/07/21 1324  BP: 120/71  Pulse: 87  Resp: 16   Temp: 98.2 F (36.8 C)  TempSrc: Oral  SpO2: 94%   General appearance: AOx3 in no acute distress; mildly fatigued appearing HEENT: NCAT.  Oropharynx clear.  Lungs: clear to auscultation bilaterally without adventitious breath sounds Heart: regular rate and rhythm.   Abdomen: soft; non-distended; no tenderness; bowel sounds present; no guarding Extremities: no edema; symmetrical with no gross deformities Skin: warm and dry Neurologic: Ambulates from chair to exam table without difficulty Psychological: alert and cooperative; normal mood and affect  Labs Reviewed - No data to display  ASSESSMENT & PLAN:  1. Fever in other diseases   2. Recurrent UTI     Meds ordered this encounter  Medications   ondansetron (ZOFRAN) 4 MG tablet    Sig: Take 1 tablet (4 mg total) by mouth every 6 (six) hours.    Dispense:  12 tablet    Refill:  0    Order Specific Question:   Supervising Provider    Answer:   Raylene Everts [id number]   ondansetron (ZOFRAN-ODT) disintegrating tablet 4 mg   cefTRIAXone (ROCEPHIN) injection 500 mg     Rocephin injection given in office Zofran given in office Resume macrobid today Push fluids and get plenty of rest.   Take antibiotic as directed and to completion Follow up with urologist for further evaluation and management Return here or go to ER if you have any new or worsening symptoms such as fever, worsening abdominal pain, nausea/vomiting, flank pain, etc...  Outlined signs and symptoms indicating need for more acute intervention. Patient verbalized understanding. After Visit Summary given.      Lestine Box, PA-C 01/07/21 1404

## 2021-01-07 NOTE — ED Triage Notes (Signed)
Was placed on nitrofurantoin on Tuesday for UTI.  Nauseated with fever. Stopped antibiotic and symptoms got better. Was told to restart medications and symptoms returned.

## 2021-01-08 ENCOUNTER — Other Ambulatory Visit: Payer: Self-pay

## 2021-01-08 ENCOUNTER — Inpatient Hospital Stay (HOSPITAL_COMMUNITY)
Admission: EM | Admit: 2021-01-08 | Discharge: 2021-01-11 | DRG: 948 | Disposition: A | Discharge status: Home / Self Care | Payer: PPO | Attending: Internal Medicine | Admitting: Internal Medicine

## 2021-01-08 ENCOUNTER — Emergency Department (HOSPITAL_COMMUNITY): Payer: PPO

## 2021-01-08 ENCOUNTER — Encounter (HOSPITAL_COMMUNITY): Payer: Self-pay | Admitting: Emergency Medicine

## 2021-01-08 DIAGNOSIS — D509 Iron deficiency anemia, unspecified: Secondary | ICD-10-CM | POA: Diagnosis present

## 2021-01-08 DIAGNOSIS — D649 Anemia, unspecified: Secondary | ICD-10-CM | POA: Diagnosis not present

## 2021-01-08 DIAGNOSIS — R1013 Epigastric pain: Secondary | ICD-10-CM | POA: Diagnosis not present

## 2021-01-08 DIAGNOSIS — K219 Gastro-esophageal reflux disease without esophagitis: Secondary | ICD-10-CM | POA: Diagnosis present

## 2021-01-08 DIAGNOSIS — R7401 Elevation of levels of liver transaminase levels: Secondary | ICD-10-CM

## 2021-01-08 DIAGNOSIS — R7989 Other specified abnormal findings of blood chemistry: Secondary | ICD-10-CM | POA: Diagnosis not present

## 2021-01-08 DIAGNOSIS — Z87440 Personal history of urinary (tract) infections: Secondary | ICD-10-CM | POA: Diagnosis not present

## 2021-01-08 DIAGNOSIS — K2100 Gastro-esophageal reflux disease with esophagitis, without bleeding: Secondary | ICD-10-CM | POA: Diagnosis not present

## 2021-01-08 DIAGNOSIS — R509 Fever, unspecified: Secondary | ICD-10-CM | POA: Diagnosis not present

## 2021-01-08 DIAGNOSIS — E611 Iron deficiency: Secondary | ICD-10-CM | POA: Diagnosis not present

## 2021-01-08 DIAGNOSIS — R748 Abnormal levels of other serum enzymes: Secondary | ICD-10-CM | POA: Diagnosis not present

## 2021-01-08 DIAGNOSIS — T378X5A Adverse effect of other specified systemic anti-infectives and antiparasitics, initial encounter: Secondary | ICD-10-CM | POA: Diagnosis not present

## 2021-01-08 DIAGNOSIS — N390 Urinary tract infection, site not specified: Secondary | ICD-10-CM | POA: Diagnosis not present

## 2021-01-08 DIAGNOSIS — K580 Irritable bowel syndrome with diarrhea: Secondary | ICD-10-CM | POA: Diagnosis not present

## 2021-01-08 DIAGNOSIS — M4856XA Collapsed vertebra, not elsewhere classified, lumbar region, initial encounter for fracture: Secondary | ICD-10-CM | POA: Diagnosis not present

## 2021-01-08 DIAGNOSIS — I959 Hypotension, unspecified: Secondary | ICD-10-CM | POA: Diagnosis not present

## 2021-01-08 DIAGNOSIS — R110 Nausea: Secondary | ICD-10-CM

## 2021-01-08 DIAGNOSIS — K921 Melena: Secondary | ICD-10-CM | POA: Diagnosis not present

## 2021-01-08 DIAGNOSIS — F32A Depression, unspecified: Secondary | ICD-10-CM | POA: Diagnosis not present

## 2021-01-08 DIAGNOSIS — B9620 Unspecified Escherichia coli [E. coli] as the cause of diseases classified elsewhere: Secondary | ICD-10-CM | POA: Diagnosis present

## 2021-01-08 DIAGNOSIS — R1011 Right upper quadrant pain: Secondary | ICD-10-CM | POA: Diagnosis not present

## 2021-01-08 DIAGNOSIS — F411 Generalized anxiety disorder: Secondary | ICD-10-CM | POA: Diagnosis not present

## 2021-01-08 DIAGNOSIS — K589 Irritable bowel syndrome without diarrhea: Secondary | ICD-10-CM | POA: Diagnosis not present

## 2021-01-08 DIAGNOSIS — Z8042 Family history of malignant neoplasm of prostate: Secondary | ICD-10-CM

## 2021-01-08 DIAGNOSIS — R0902 Hypoxemia: Secondary | ICD-10-CM | POA: Diagnosis not present

## 2021-01-08 DIAGNOSIS — N281 Cyst of kidney, acquired: Secondary | ICD-10-CM | POA: Diagnosis not present

## 2021-01-08 DIAGNOSIS — Z20822 Contact with and (suspected) exposure to covid-19: Secondary | ICD-10-CM | POA: Diagnosis present

## 2021-01-08 DIAGNOSIS — K4090 Unilateral inguinal hernia, without obstruction or gangrene, not specified as recurrent: Secondary | ICD-10-CM | POA: Diagnosis not present

## 2021-01-08 DIAGNOSIS — K449 Diaphragmatic hernia without obstruction or gangrene: Secondary | ICD-10-CM | POA: Diagnosis not present

## 2021-01-08 DIAGNOSIS — R42 Dizziness and giddiness: Secondary | ICD-10-CM | POA: Diagnosis not present

## 2021-01-08 DIAGNOSIS — D61818 Other pancytopenia: Secondary | ICD-10-CM

## 2021-01-08 LAB — CBC WITH DIFFERENTIAL/PLATELET
Abs Immature Granulocytes: 0.06 10*3/uL (ref 0.00–0.07)
Basophils Absolute: 0 10*3/uL (ref 0.0–0.1)
Basophils Relative: 0 %
Eosinophils Absolute: 0.1 10*3/uL (ref 0.0–0.5)
Eosinophils Relative: 1 %
HCT: 40.7 % (ref 36.0–46.0)
Hemoglobin: 13.5 g/dL (ref 12.0–15.0)
Immature Granulocytes: 1 %
Lymphocytes Relative: 6 %
Lymphs Abs: 0.6 10*3/uL — ABNORMAL LOW (ref 0.7–4.0)
MCH: 28.2 pg (ref 26.0–34.0)
MCHC: 33.2 g/dL (ref 30.0–36.0)
MCV: 85.1 fL (ref 80.0–100.0)
Monocytes Absolute: 0.5 10*3/uL (ref 0.1–1.0)
Monocytes Relative: 4 %
Neutro Abs: 9.1 10*3/uL — ABNORMAL HIGH (ref 1.7–7.7)
Neutrophils Relative %: 88 %
Platelets: 153 10*3/uL (ref 150–400)
RBC: 4.78 MIL/uL (ref 3.87–5.11)
RDW: 14.6 % (ref 11.5–15.5)
WBC: 10.4 10*3/uL (ref 4.0–10.5)
nRBC: 0 % (ref 0.0–0.2)

## 2021-01-08 LAB — URINALYSIS, ROUTINE W REFLEX MICROSCOPIC
Bilirubin Urine: NEGATIVE
Glucose, UA: NEGATIVE mg/dL
Hgb urine dipstick: NEGATIVE
Ketones, ur: 5 mg/dL — AB
Leukocytes,Ua: NEGATIVE
Nitrite: NEGATIVE
Protein, ur: NEGATIVE mg/dL
Specific Gravity, Urine: 1.011 (ref 1.005–1.030)
pH: 6 (ref 5.0–8.0)

## 2021-01-08 LAB — COMPREHENSIVE METABOLIC PANEL
ALT: 251 U/L — ABNORMAL HIGH (ref 0–44)
AST: 195 U/L — ABNORMAL HIGH (ref 15–41)
Albumin: 3.1 g/dL — ABNORMAL LOW (ref 3.5–5.0)
Alkaline Phosphatase: 178 U/L — ABNORMAL HIGH (ref 38–126)
Anion gap: 10 (ref 5–15)
BUN: 22 mg/dL (ref 8–23)
CO2: 22 mmol/L (ref 22–32)
Calcium: 8.3 mg/dL — ABNORMAL LOW (ref 8.9–10.3)
Chloride: 98 mmol/L (ref 98–111)
Creatinine, Ser: 0.91 mg/dL (ref 0.44–1.00)
GFR, Estimated: >60 mL/min (ref >60)
Glucose, Bld: 129 mg/dL — ABNORMAL HIGH (ref 70–99)
Potassium: 3.6 mmol/L (ref 3.5–5.1)
Sodium: 130 mmol/L — ABNORMAL LOW (ref 135–145)
Total Bilirubin: 3.3 mg/dL — ABNORMAL HIGH (ref 0.3–1.2)
Total Protein: 6.6 g/dL (ref 6.5–8.1)

## 2021-01-08 LAB — RESP PANEL BY RT-PCR (FLU A&B, COVID) ARPGX2
Influenza A by PCR: NEGATIVE
Influenza B by PCR: NEGATIVE
SARS Coronavirus 2 by RT PCR: NEGATIVE

## 2021-01-08 LAB — LIPASE, BLOOD: Lipase: 38 U/L (ref 11–51)

## 2021-01-08 MED ORDER — METHENAMINE MANDELATE 1 G PO TABS
1.0000 g | ORAL_TABLET | Freq: Two times a day (BID) | ORAL | Status: DC
  Administered 2021-01-09 – 2021-01-11 (×6): 1 g via ORAL
  Filled 2021-01-08 (×2): qty 1
  Filled 2021-01-08 (×2): qty 2
  Filled 2021-01-08 (×2): qty 1
  Filled 2021-01-08 (×2): qty 2

## 2021-01-08 MED ORDER — ONDANSETRON HCL 4 MG/2ML IJ SOLN: 4.0000 mg | Freq: Four times a day (QID) | INTRAMUSCULAR | Status: DC | PRN

## 2021-01-08 MED ORDER — PANTOPRAZOLE SODIUM 40 MG IV SOLR
40.0000 mg | INTRAVENOUS | Status: DC
  Administered 2021-01-08 – 2021-01-09 (×2): 40 mg via INTRAVENOUS
  Filled 2021-01-08 (×2): qty 40

## 2021-01-08 MED ORDER — ONDANSETRON HCL 4 MG PO TABS: 4.0000 mg | ORAL_TABLET | Freq: Four times a day (QID) | ORAL | Status: DC | PRN

## 2021-01-08 MED ORDER — SODIUM CHLORIDE 0.9 % IV SOLN
2.0000 g | INTRAVENOUS | Status: DC
  Administered 2021-01-09: 2 g via INTRAVENOUS
  Filled 2021-01-08 (×2): qty 20

## 2021-01-08 MED ORDER — LOPERAMIDE HCL 2 MG PO CAPS: 2.0000 mg | ORAL_CAPSULE | Freq: Every day | ORAL | Status: DC | PRN

## 2021-01-08 MED ORDER — SODIUM CHLORIDE 0.9 % IV SOLN: INTRAVENOUS | Status: DC

## 2021-01-08 MED ORDER — SODIUM CHLORIDE 0.9 % IV BOLUS
500.0000 mL | Freq: Once | INTRAVENOUS | Status: AC
  Administered 2021-01-08: 500 mL via INTRAVENOUS

## 2021-01-08 MED ORDER — ALPRAZOLAM 0.5 MG PO TABS
0.5000 mg | ORAL_TABLET | Freq: Once | ORAL | Status: AC
  Administered 2021-01-08: 0.5 mg via ORAL
  Filled 2021-01-08: qty 1

## 2021-01-08 MED ORDER — HYDROCODONE-ACETAMINOPHEN 5-325 MG PO TABS: 1.0000 | ORAL_TABLET | ORAL | Status: DC | PRN

## 2021-01-08 MED ORDER — ALTEPLASE 100 MG IV SOLR
INTRAVENOUS | Status: AC
  Filled 2021-01-08: qty 100

## 2021-01-08 MED ORDER — VITAMIN B-12 1000 MCG PO TABS
1000.0000 ug | ORAL_TABLET | Freq: Every day | ORAL | Status: DC
  Administered 2021-01-09 – 2021-01-11 (×3): 1000 ug via ORAL
  Filled 2021-01-08 (×3): qty 1

## 2021-01-08 MED ORDER — ATORVASTATIN CALCIUM 20 MG PO TABS
20.0000 mg | ORAL_TABLET | Freq: Every evening | ORAL | Status: DC
  Administered 2021-01-09 – 2021-01-10 (×2): 20 mg via ORAL
  Filled 2021-01-08 (×2): qty 1

## 2021-01-08 MED ORDER — IOHEXOL 350 MG/ML SOLN
85.0000 mL | Freq: Once | INTRAVENOUS | Status: AC | PRN
  Administered 2021-01-08: 85 mL via INTRAVENOUS

## 2021-01-08 MED ORDER — HYDROCODONE-ACETAMINOPHEN 5-325 MG PO TABS
1.0000 | ORAL_TABLET | Freq: Once | ORAL | Status: AC
  Administered 2021-01-08: 1 via ORAL
  Filled 2021-01-08: qty 1

## 2021-01-08 MED ORDER — ALPRAZOLAM 0.5 MG PO TABS: 0.5000 mg | ORAL_TABLET | Freq: Every evening | ORAL | Status: DC | PRN

## 2021-01-08 MED ORDER — SODIUM CHLORIDE 0.9 % IV SOLN
1.0000 g | Freq: Once | INTRAVENOUS | Status: AC
  Administered 2021-01-08: 1 g via INTRAVENOUS
  Filled 2021-01-08: qty 10

## 2021-01-08 NOTE — H&P (Signed)
History and Physical  Stephanie Wood [id number] DOB: [birth year] DOA: 01/08/2021  Referring physician: Dr Sabra Heck, ED physician PCP: [name], MD  Outpatient Specialists:   Patient Coming From: home  Chief Complaint: fever, abdominal pain  HPI: Stephanie Wood is a 86 y.o. female with a history of GERD, anxiety, IBS. Patient presents with fevers and abdominal discomfort that started about 4 days ago. She was diagnosed with a UTI by urology a day prior, was prescribed macrobid. She took two doses that day, then had a fever the next morning.  Fevers continued daily, improved with Tylenol.  No provoking factors.  She continued to take Macrobid was unable to take any of her other medications.  She was seen at the urgent care yesterday and given a dose of Rocephin.  As her symptoms continue to not improve, the patient presented here for evaluation.  Emergency Department Course: White count 10.4, creatinine 0.91, lipase normal, AST/ALT 195/251.  Total bili 3.3.  COVID-negative.  UA looks fairly normal.  Urine culture from 8/9 show E. coli with pan sensitivities.  Review of Systems:   Pt denies any fevers, chills, nausea, vomiting, diarrhea, constipation, shortness of breath, dyspnea on exertion, orthopnea, cough, wheezing, palpitations, headache, vision changes, lightheadedness, dizziness, melena, rectal bleeding.  Review of systems are otherwise negative  Past Medical History:  Diagnosis Date   Acid reflux disease    Anemia    Anxiety    Colonic adenoma    Depression    Irritable bowel syndrome    Past Surgical History:  Procedure Laterality Date   CATARACT EXTRACTION     COLONOSCOPY N/A 04/15/2014   Procedure: COLONOSCOPY;  Surgeon: Rogene Houston, MD;  Location: [facility];  Service: Endoscopy;  Laterality: N/A;  730   ESOPHAGOGASTRODUODENOSCOPY  09/20/2011   Procedure: ESOPHAGOGASTRODUODENOSCOPY (EGD);  Surgeon: Rogene Houston, MD;  Location: [facility];  Service:  Endoscopy;  Laterality: N/A;  215   EYE SURGERY     bil catatract surgery   Social History:  reports that she has never smoked. She has never used smokeless tobacco. She reports that she does not drink alcohol and does not use drugs. Patient lives at home  Allergies  Allergen Reactions   Drug Class [Trazodone And Nefazodone]    No Known Allergies     Family History  Problem Relation Age of Onset   Prostate cancer Father      Prior to Admission medications   Medication Sig Start Date End Date Taking? Authorizing Provider  ALPRAZolam Duanne Moron) 0.5 MG tablet Take 0.5 mg by mouth at bedtime as needed for sleep or anxiety. Sleep   Yes [provider]  atorvastatin (LIPITOR) 20 MG tablet Take 20 mg by mouth daily.   Yes [provider]  bifidobacterium infantis (ALIGN) capsule Take 1 capsule by mouth daily.   Yes [provider]  Calcium Carbonate (CALCIUM 600 PO) Take 1 capsule by mouth daily.   Yes [provider]  ELDERBERRY PO Take 1 capsule by mouth daily.   Yes [provider]  famotidine (PEPCID) 20 MG tablet Take 20 mg by mouth 2 (two) times daily.   Yes [provider]  FLUTICASONE PROPIONATE, NASAL, NA Place 1 spray into the nose daily as needed (rhinitis).   Yes [provider]  ipratropium (ATROVENT) 0.06 % nasal spray Place 2 sprays into both nostrils 2 (two) times daily as needed for rhinitis. 11/07/19  Yes [provider]  loperamide (IMODIUM)  2 MG capsule Take 2 mg by mouth daily as needed for diarrhea or loose stools. 10/23/17  Yes Rehman, Mechele Dawley, MD  loratadine (CLARITIN) 10 MG tablet Take 10 mg by mouth daily.   Yes [provider]  methenamine (HIPREX) 1 g tablet Take 1 g by mouth 2 (two) times daily. 09/04/20  Yes [provider]  nitrofurantoin, macrocrystal-monohydrate, (MACROBID) 100 MG capsule Take 1 capsule (100 mg total) by mouth 2 (two) times daily for 7 days. 01/04/21 01/11/21 Yes  Dahlstedt, Annie Main, MD  ondansetron (ZOFRAN) 4 MG tablet Take 1 tablet (4 mg total) by mouth every 6 (six) hours. Patient taking differently: Take 4 mg by mouth every 6 (six) hours as needed for nausea or vomiting. 01/07/21  Yes Wurst, [place], PA-C  Pediatric Multivitamins-Iron (FLINTSTONES PLUS IRON) chewable tablet Chew 1 tablet by mouth 2 (two) times a week. 07/22/18  Yes Rehman, Mechele Dawley, MD  PROLIA 60 MG/ML SOSY injection SMARTSIG:SUB-Q Twice a Year 10/20/19  Yes [provider]  vitamin B-12 (CYANOCOBALAMIN) 1000 MCG tablet Take 1,000 mcg by mouth daily.   Yes [provider]  Vitamin D, Ergocalciferol, (DRISDOL) 50000 UNITS CAPS capsule Take 50,000 Units by mouth every 7 (seven) days. Takes on Saturdays.   Yes [provider]    Physical Exam: BP 116/61   Pulse 84   Temp 99.6 F (37.6 C)   Resp 20   Ht '5\' 4"'$  (1.626 m)   Wt 72.3 kg   SpO2 94%   BMI 27.38 kg/m   General: Elderly female. Awake and alert and oriented x3. No acute cardiopulmonary distress.  HEENT: Normocephalic atraumatic.  Right and left ears normal in appearance.  Pupils equal, round, reactive to light. Extraocular muscles are intact. Sclerae anicteric and noninjected.  Moist mucosal membranes. No mucosal lesions.  Neck: Neck supple without lymphadenopathy. No carotid bruits. No masses palpated.  Cardiovascular: Regular rate with normal S1-S2 sounds. No murmurs, rubs, gallops auscultated. No JVD.  Respiratory: Good respiratory effort with no wheezes, rales, rhonchi. Lungs clear to auscultation bilaterally.  No accessory muscle use. Abdomen: Soft, mild tenderness in the right upper quadrant and in the lower abdomen, especially suprapubic area. Nondistended. Active bowel sounds. No masses or hepatosplenomegaly  Skin: No rashes, lesions, or ulcerations.  Dry, warm to touch. 2+ dorsalis pedis and radial pulses. Musculoskeletal: No calf or leg pain. All major joints not erythematous nontender.  No  upper or lower joint deformation.  Good ROM.  No contractures  Psychiatric: Intact judgment and insight. Pleasant and cooperative. Neurologic: No focal neurological deficits. Strength is 5/5 and symmetric in upper and lower extremities.  Cranial nerves II through XII are grossly intact.           Labs on Admission: I have personally reviewed following labs and imaging studies  CBC: Recent Labs  Lab 01/08/21 1140  WBC 10.4  NEUTROABS 9.1*  HGB 13.5  HCT 40.7  MCV 85.1  PLT 0000000   Basic Metabolic Panel: Recent Labs  Lab 01/08/21 1140  NA 130*  K 3.6  CL 98  CO2 22  GLUCOSE 129*  BUN 22  CREATININE 0.91  CALCIUM 8.3*   GFR: Estimated Creatinine Clearance: 44.8 mL/min (by C-G formula based on SCr of 0.91 mg/dL). Liver Function Tests: Recent Labs  Lab 01/08/21 1140  AST 195*  ALT 251*  ALKPHOS 178*  BILITOT 3.3*  PROT 6.6  ALBUMIN 3.1*   Recent Labs  Lab 01/08/21 1140  LIPASE 38  No results for input(s): AMMONIA in the last 168 hours. Coagulation Profile: No results for input(s): INR, PROTIME in the last 168 hours. Cardiac Enzymes: No results for input(s): CKTOTAL, CKMB, CKMBINDEX, TROPONINI in the last 168 hours. BNP (last 3 results) No results for input(s): PROBNP in the last 8760 hours. HbA1C: No results for input(s): HGBA1C in the last 72 hours. CBG: No results for input(s): GLUCAP in the last 168 hours. Lipid Profile: No results for input(s): CHOL, HDL, LDLCALC, TRIG, CHOLHDL, LDLDIRECT in the last 72 hours. Thyroid Function Tests: No results for input(s): TSH, T4TOTAL, FREET4, T3FREE, THYROIDAB in the last 72 hours. Anemia Panel: No results for input(s): VITAMINB12, FOLATE, FERRITIN, TIBC, IRON, RETICCTPCT in the last 72 hours. Urine analysis:    Component Value Date/Time   COLORURINE YELLOW 01/08/2021 1114   APPEARANCEUR CLEAR 01/08/2021 1114   APPEARANCEUR Cloudy (A) 01/04/2021 1428   LABSPEC 1.011 01/08/2021 1114   PHURINE 6.0 01/08/2021  1114   GLUCOSEU NEGATIVE 01/08/2021 1114   HGBUR NEGATIVE 01/08/2021 1114   BILIRUBINUR NEGATIVE 01/08/2021 1114   BILIRUBINUR Negative 01/04/2021 1428   KETONESUR 5 (A) 01/08/2021 1114   PROTEINUR NEGATIVE 01/08/2021 1114   UROBILINOGEN 0.2 08/23/2013 1106   NITRITE NEGATIVE 01/08/2021 1114   LEUKOCYTESUR NEGATIVE 01/08/2021 1114   Sepsis Labs: '@LABRCNTIP'$ (procalcitonin:4,lacticidven:4) ) Recent Results (from the past 240 hour(s))  Microscopic Examination     Status: Abnormal   Collection Time: 01/04/21  2:28 PM   Urine  Result Value Ref Range Status   WBC, UA >30 (A) 0 - 5 /hpf Final   RBC 0-2 0 - 2 /hpf Final   Epithelial Cells (non renal) 0-10 0 - 10 /hpf Final   Renal Epithel, UA None seen None seen /hpf Final   Casts None seen None seen /lpf Final   Bacteria, UA Moderate (A) None seen/Few Final  Urine Culture     Status: Abnormal   Collection Time: 01/04/21  4:41 PM   Specimen: Urine, Clean Catch   UR  Result Value Ref Range Status   Urine Culture, Routine Final report (A)  Final   Organism ID, Bacteria Escherichia coli (A)  Final    Comment: Cefazolin <=4 ug/mL Cefazolin with an MIC <=16 predicts susceptibility to the oral agents cefaclor, cefdinir, cefpodoxime, cefprozil, cefuroxime, cephalexin, and loracarbef when used for therapy of uncomplicated urinary tract infections due to E. coli, Klebsiella pneumoniae, and Proteus mirabilis. 50,000-100,000 colony forming units per mL    Antimicrobial Susceptibility Comment  Final    Comment:       ** S = Susceptible; I = Intermediate; R = Resistant **                    P = Positive; N = Negative             MICS are expressed in micrograms per mL    Antibiotic                 RSLT#1    RSLT#2    RSLT#3    RSLT#4 Amoxicillin/Clavulanic Acid    S Ampicillin                     S Cefepime                       S Ceftriaxone  S Cefuroxime                     S Ciprofloxacin                  S Ertapenem                       S Gentamicin                     S Imipenem                       S Levofloxacin                   S Meropenem                      S Nitrofurantoin                 S Piperacillin/Tazobactam        S Tetracycline                   S Tobramycin                     S Trimethoprim/Sulfa             S   Resp Panel by RT-PCR (Flu A&B, Covid) Nasopharyngeal Swab     Status: None   Collection Time: 01/08/21 11:15 AM   Specimen: Nasopharyngeal Swab; Nasopharyngeal(NP) swabs in vial transport medium  Result Value Ref Range Status   SARS Coronavirus 2 by RT PCR NEGATIVE NEGATIVE Final    Comment: (NOTE) SARS-CoV-2 target nucleic acids are NOT DETECTED.  The SARS-CoV-2 RNA is generally detectable in upper respiratory specimens during the acute phase of infection. The lowest concentration of SARS-CoV-2 viral copies this assay can detect is 138 copies/mL. A negative result does not preclude SARS-Cov-2 infection and should not be used as the sole basis for treatment or other patient management decisions. A negative result may occur with  improper specimen collection/handling, submission of specimen other than nasopharyngeal swab, presence of viral mutation(s) within the areas targeted by this assay, and inadequate number of viral copies(<138 copies/mL). A negative result must be combined with clinical observations, patient history, and epidemiological information. The expected result is Negative.  Fact Sheet for Patients:  EntrepreneurPulse.com.au  Fact Sheet for Healthcare Providers:  IncredibleEmployment.be  This test is no t yet approved or cleared by the [place] FDA and  has been authorized for detection and/or diagnosis of SARS-CoV-2 by FDA under an Emergency Use Authorization (EUA). This EUA will remain  in effect (meaning this test can be used) for the duration of the COVID-19 declaration under Section 564(b)(1) of the  Act, 21 U.S.C.section 360bbb-3(b)(1), unless the authorization is terminated  or revoked sooner.       Influenza A by PCR NEGATIVE NEGATIVE Final   Influenza B by PCR NEGATIVE NEGATIVE Final    Comment: (NOTE) The Xpert Xpress SARS-CoV-2/FLU/RSV plus assay is intended as an aid in the diagnosis of influenza from Nasopharyngeal swab specimens and should not be used as a sole basis for treatment. Nasal washings and aspirates are unacceptable for Xpert Xpress SARS-CoV-2/FLU/RSV testing.  Fact Sheet for Patients: EntrepreneurPulse.com.au  Fact Sheet for Healthcare Providers: IncredibleEmployment.be  This test is not yet approved or cleared by the [place] and has been authorized for  detection and/or diagnosis of SARS-CoV-2 by FDA under an Emergency Use Authorization (EUA). This EUA will remain in effect (meaning this test can be used) for the duration of the COVID-19 declaration under Section 564(b)(1) of the Act, 21 U.S.C. section 360bbb-3(b)(1), unless the authorization is terminated or revoked.  Performed at [facility], [address] [place], [place] [address]      Radiological Exams on Admission: CT Abdomen Pelvis W Contrast  Result Date: 01/08/2021 CLINICAL DATA:  Abdominal abscess/infection suspected. Urinary tract infection. Epigastric pain EXAM: CT ABDOMEN AND PELVIS WITH CONTRAST TECHNIQUE: Multidetector CT imaging of the abdomen and pelvis was performed using the standard protocol following bolus administration of intravenous contrast. CONTRAST:  101m OMNIPAQUE IOHEXOL 350 MG/ML SOLN COMPARISON:  12/28/2016, 12/20/2018 FINDINGS: Lower chest: Bibasilar atelectasis. Heart size is within normal limits. Incompletely visualized large hiatal hernia containing the majority of the stomach. Hepatobiliary: No focal liver abnormality is seen. No gallstones, gallbladder wall thickening, or biliary dilatation. Pancreas: Unremarkable. No  pancreatic ductal dilatation or surrounding inflammatory changes. Spleen: Normal in size without focal abnormality. Adrenals/Urinary Tract: Unremarkable adrenal glands. Two left renal cysts, largest measuring up to 2.2 cm. Subcentimeter low-density lesions within the right kidney which are too small to definitively characterize, but most likely represent cysts. Kidneys have otherwise symmetric enhancement. No renal Danielski or hydronephrosis. Mildly distended bladder with a single bubble of air within the bladder lumen. No appreciable wall thickening. Stomach/Bowel: Stomach is predominantly intrathoracic. Small bowel appears within normal limits. Appendix appears normal (series 2, image 47). No evidence of bowel wall thickening, distention, or inflammatory changes. Vascular/Lymphatic: Scattered aortoiliac atherosclerotic calcifications without aneurysm. No abdominopelvic lymphadenopathy. Reproductive: Uterus and bilateral adnexa are unremarkable. Other: No free fluid. No abdominopelvic fluid collection. No pneumoperitoneum. Small fat containing right inguinal hernia. Musculoskeletal: Severe compression fracture of the L1 vertebral body, which appears chronic although has progressed from previous radiographs of 12/20/2018. No additional significant bony findings. IMPRESSION: 1. No acute abdominopelvic findings. 2. Single bubble of air within the bladder lumen. Correlate for recent instrumentation. 3. Severe compression fracture of the L1 vertebral body, which appears chronic although has progressed from previous radiographs of 12/20/2018. 4. Incompletely visualized large hiatal hernia containing the majority of the stomach. Aortic Atherosclerosis (ICD10-I70.0). Electronically Signed   By: NDavina PokeD.O.   On: 01/08/2021 16:23    EKG: none  Assessment/Plan: Principal Problem:   Elevated LFTs Active Problems:   GERD (gastroesophageal reflux disease)   Anxiety state   Irritable bowel syndrome (IBS)     This patient was discussed with the ED physician, including pertinent vitals, physical exam findings, labs, and imaging.  We also discussed care given by the ED provider.  Elevated LFTs GI was consulted by EDP Admit IV fluids Right upper quadrant ultrasound in the morning Recheck CMP in the morning No acute abdominal process on CT NPO after midnight per GI request Possible UTI Continue Rocephin Check urine culture Tylenol for fever IBS Anxiety Continue xanax GERD Protonix  DVT prophylaxis: SCDs Consultants:  Code Status: Full Family Communication: daughter present  Disposition Plan: patient should be able to return home following admission.   STruett Mainland DO

## 2021-01-08 NOTE — ED Triage Notes (Signed)
Pt to the ED [facility] with sudden onset epigastric pain.  Pt was dx with a UTI at [facility] yesterday and prescribed antibiotics.

## 2021-01-08 NOTE — ED Notes (Signed)
Attempted report x1. 

## 2021-01-08 NOTE — ED Notes (Signed)
ED TO INPATIENT HANDOFF REPORT  ED Nurse Name and Phone #:   S Name/Age/Gender Stephanie Wood 86 y.o. female Room/Bed: APA16A/APA16A  Code Status   Code Status: Not on file  Home/SNF/Other Home Patient oriented to: self, place, time and situation Is this baseline? Yes   Triage Complete: Triage complete  Chief Complaint Elevated LFTs [R79.89]  Triage Note Pt to the ED [facility] with sudden onset epigastric pain.  Pt was dx with a UTI at [facility] yesterday and prescribed antibiotics.     Allergies Allergies  Allergen Reactions  . Drug Class [Trazodone And Nefazodone]   . No Known Allergies     Level of Care/Admitting Diagnosis ED Disposition    ED Disposition  Admit   Condition  --   [place]: [facility] [id number]  Level of Care: Med-Surg [16]  Covid Evaluation: Asymptomatic Screening Protocol (No Symptoms)  Diagnosis: Elevated LFTs [id number]  Admitting Physician: Truett Mainland [[id number]]  Attending Physician: Truett Mainland [[id number]]  Estimated length of stay: 3 - 4 days  Certification:: I certify this patient will need inpatient services for at least 2 midnights         B Medical/Surgery History Past Medical History:  Diagnosis Date  . Acid reflux disease   . Anemia   . Anxiety   . Colonic adenoma   . Depression   . Irritable bowel syndrome    Past Surgical History:  Procedure Laterality Date  . CATARACT EXTRACTION    . COLONOSCOPY N/A 04/15/2014   Procedure: COLONOSCOPY;  Surgeon: Rogene Houston, MD;  [facility];  Service: Endoscopy;  Laterality: N/A;  730  . ESOPHAGOGASTRODUODENOSCOPY  09/20/2011   Procedure: ESOPHAGOGASTRODUODENOSCOPY (EGD);  Surgeon: Rogene Houston, MD;  Location: [facility];  Service: Endoscopy;  Laterality: N/A;  215  . EYE SURGERY     bil catatract surgery     A IV Location/Drains/Wounds Patient Lines/Drains/Airways Status    Active Line/Drains/Airways    Name Placement date Placement  time Site Days   Peripheral IV 01/08/21 20 G Wood Wrist 01/08/21  1054  Wrist  less than 1   External Urinary Catheter 01/08/21  1514  --  less than 1          Intake/Output Last 24 hours  Intake/Output Summary (Last 24 hours) at 01/08/2021 2214 Last data filed at 01/08/2021 [id number] Gross per 24 hour  Intake 604.13 ml  Output --  Net 604.13 ml    Labs/Imaging Results for orders placed or performed during the hospital encounter of 01/08/21 (from the past 48 hour(s))  Urinalysis, Routine w reflex microscopic Urine, Clean Catch     Status: Abnormal   Collection Time: 01/08/21 11:14 AM  Result Value Ref Range   Color, Urine YELLOW YELLOW   APPearance CLEAR CLEAR   Specific Gravity, Urine 1.011 1.005 - 1.030   pH 6.0 5.0 - 8.0   Glucose, UA NEGATIVE NEGATIVE mg/dL   Hgb urine dipstick NEGATIVE NEGATIVE   Bilirubin Urine NEGATIVE NEGATIVE   Ketones, ur 5 (A) NEGATIVE mg/dL   Protein, ur NEGATIVE NEGATIVE mg/dL   Nitrite NEGATIVE NEGATIVE   Leukocytes,Ua NEGATIVE NEGATIVE    Comment: Performed at [facility], [address] [place], [place] [address]  Resp Panel by RT-PCR (Flu A&B, Covid) Nasopharyngeal Swab     Status: None   Collection Time: 01/08/21 11:15 AM   Specimen: Nasopharyngeal Swab; Nasopharyngeal(NP) swabs in vial transport medium  Result Value  Ref Range   SARS Coronavirus 2 by RT PCR NEGATIVE NEGATIVE    Comment: (NOTE) SARS-CoV-2 target nucleic acids are NOT DETECTED.  The SARS-CoV-2 RNA is generally detectable in upper respiratory specimens during the acute phase of infection. The lowest concentration of SARS-CoV-2 viral copies this assay can detect is 138 copies/mL. A negative result does not preclude SARS-Cov-2 infection and should not be used as the sole basis for treatment or other patient management decisions. A negative result may occur with  improper specimen collection/handling, submission of specimen other than nasopharyngeal swab, presence of viral  mutation(s) within the areas targeted by this assay, and inadequate number of viral copies(<138 copies/mL). A negative result must be combined with clinical observations, patient history, and epidemiological information. The expected result is Negative.  Fact Sheet for Patients:  EntrepreneurPulse.com.au  Fact Sheet for Healthcare Providers:  IncredibleEmployment.be  This test is no t yet approved or cleared by the [place] FDA and  has been authorized for detection and/or diagnosis of SARS-CoV-2 by FDA under an Emergency Use Authorization (EUA). This EUA will remain  in effect (meaning this test can be used) for the duration of the COVID-19 declaration under Section 564(b)(1) of the Act, 21 U.S.C.section 360bbb-3(b)(1), unless the authorization is terminated  or revoked sooner.       Influenza A by PCR NEGATIVE NEGATIVE   Influenza B by PCR NEGATIVE NEGATIVE    Comment: (NOTE) The Xpert Xpress SARS-CoV-2/FLU/RSV plus assay is intended as an aid in the diagnosis of influenza from Nasopharyngeal swab specimens and should not be used as a sole basis for treatment. Nasal washings and aspirates are unacceptable for Xpert Xpress SARS-CoV-2/FLU/RSV testing.  Fact Sheet for Patients: EntrepreneurPulse.com.au  Fact Sheet for Healthcare Providers: IncredibleEmployment.be  This test is not yet approved or cleared by the [place] FDA and has been authorized for detection and/or diagnosis of SARS-CoV-2 by FDA under an Emergency Use Authorization (EUA). This EUA will remain in effect (meaning this test can be used) for the duration of the COVID-19 declaration under Section 564(b)(1) of the Act, 21 U.S.C. section 360bbb-3(b)(1), unless the authorization is terminated or revoked.  Performed at [facility], [address], [place], [place] [address]   Comprehensive metabolic panel     Status: Abnormal    Collection Time: 01/08/21 11:40 AM  Result Value Ref Range   Sodium 130 (L) 135 - 145 mmol/L   Potassium 3.6 3.5 - 5.1 mmol/L   Chloride 98 98 - 111 mmol/L   CO2 22 22 - 32 mmol/L   Glucose, Bld 129 (H) 70 - 99 mg/dL    Comment: Glucose reference range applies only to samples taken after fasting for at least 8 hours.   BUN 22 8 - 23 mg/dL   Creatinine, Ser 0.91 0.44 - 1.00 mg/dL   Calcium 8.3 (L) 8.9 - 10.3 mg/dL   Total Protein 6.6 6.5 - 8.1 g/dL   Albumin 3.1 (L) 3.5 - 5.0 g/dL   AST 195 (H) 15 - 41 U/L   ALT 251 (H) 0 - 44 U/L   Alkaline Phosphatase 178 (H) 38 - 126 U/L   Total Bilirubin 3.3 (H) 0.3 - 1.2 mg/dL   GFR, Estimated >60 >60 mL/min    Comment: (NOTE) Calculated using the CKD-EPI Creatinine Equation (2021)    Anion gap 10 5 - 15    Comment: Performed at [facility], [address] [place], [place] [address]  CBC with Differential  Status: Abnormal   Collection Time: 01/08/21 11:40 AM  Result Value Ref Range   WBC 10.4 4.0 - 10.5 K/uL   RBC 4.78 3.87 - 5.11 MIL/uL   Hemoglobin 13.5 12.0 - 15.0 g/dL   HCT 40.7 36.0 - 46.0 %   MCV 85.1 80.0 - 100.0 fL   MCH 28.2 26.0 - 34.0 pg   MCHC 33.2 30.0 - 36.0 g/dL   RDW 14.6 11.5 - 15.5 %   Platelets 153 150 - 400 K/uL   nRBC 0.0 0.0 - 0.2 %   Neutrophils Relative % 88 %   Neutro Abs 9.1 (H) 1.7 - 7.7 K/uL   Lymphocytes Relative 6 %   Lymphs Abs 0.6 (L) 0.7 - 4.0 K/uL   Monocytes Relative 4 %   Monocytes Absolute 0.5 0.1 - 1.0 K/uL   Eosinophils Relative 1 %   Eosinophils Absolute 0.1 0.0 - 0.5 K/uL   Basophils Relative 0 %   Basophils Absolute 0.0 0.0 - 0.1 K/uL   Immature Granulocytes 1 %   Abs Immature Granulocytes 0.06 0.00 - 0.07 K/uL    Comment: Performed at [facility], [address], [place], [place] [address]  Lipase, blood     Status: None   Collection Time: 01/08/21 11:40 AM  Result Value Ref Range   Lipase 38 11 - 51 U/L    Comment: Performed at [facility], [address] [place], [place]  [address]   CT Abdomen Pelvis W Contrast  Result Date: 01/08/2021 CLINICAL DATA:  Abdominal abscess/infection suspected. Urinary tract infection. Epigastric pain EXAM: CT ABDOMEN AND PELVIS WITH CONTRAST TECHNIQUE: Multidetector CT imaging of the abdomen and pelvis was performed using the standard protocol following bolus administration of intravenous contrast. CONTRAST:  57m OMNIPAQUE IOHEXOL 350 MG/ML SOLN COMPARISON:  12/28/2016, 12/20/2018 FINDINGS: Lower chest: Bibasilar atelectasis. Heart size is within normal limits. Incompletely visualized large hiatal hernia containing the majority of the stomach. Hepatobiliary: No focal liver abnormality is seen. No gallstones, gallbladder wall thickening, or biliary dilatation. Pancreas: Unremarkable. No pancreatic ductal dilatation or surrounding inflammatory changes. Spleen: Normal in size without focal abnormality. Adrenals/Urinary Tract: Unremarkable adrenal glands. Two left renal cysts, largest measuring up to 2.2 cm. Subcentimeter low-density lesions within the Wood kidney which are too small to definitively characterize, but most likely represent cysts. Kidneys have otherwise symmetric enhancement. No renal Witcher or hydronephrosis. Mildly distended bladder with a single bubble of air within the bladder lumen. No appreciable wall thickening. Stomach/Bowel: Stomach is predominantly intrathoracic. Small bowel appears within normal limits. Appendix appears normal (series 2, image 47). No evidence of bowel wall thickening, distention, or inflammatory changes. Vascular/Lymphatic: Scattered aortoiliac atherosclerotic calcifications without aneurysm. No abdominopelvic lymphadenopathy. Reproductive: Uterus and bilateral adnexa are unremarkable. Other: No free fluid. No abdominopelvic fluid collection. No pneumoperitoneum. Small fat containing Wood inguinal hernia. Musculoskeletal: Severe compression fracture of the L1 vertebral body, which appears chronic although has  progressed from previous radiographs of 12/20/2018. No additional significant bony findings. IMPRESSION: 1. No acute abdominopelvic findings. 2. Single bubble of air within the bladder lumen. Correlate for recent instrumentation. 3. Severe compression fracture of the L1 vertebral body, which appears chronic although has progressed from previous radiographs of 12/20/2018. 4. Incompletely visualized large hiatal hernia containing the majority of the stomach. Aortic Atherosclerosis (ICD10-I70.0). Electronically Signed   By: NDavina PokeD.O.   On: 01/08/2021 16:23    Pending Labs [employer](From admission, onward)    Start     Ordered  01/08/21 1811  Urine Culture  Once,   STAT       Question:  Indication  Answer:  Dysuria   01/08/21 1810   Signed and Held  Comprehensive metabolic panel  Tomorrow morning,   R        Signed and Held   Signed and Held  CBC  Tomorrow morning,   R        Signed and Held   Signed and Held  Protime-INR  Tomorrow morning,   R        Signed and Held   Signed and Held  Occult blood card to lab, stool  Once,   R        Signed and Held          Vitals/Pain Today's Vitals   01/08/21 1716 01/08/21 1730 01/08/21 1800 01/08/21 1830  BP: 111/63 116/61 125/67 119/61  Pulse: 83 84 89 91  Resp: (!) 22 20 (!) 24 (!) 25  Temp:      SpO2: 93% 94% 91% 99%  Weight:      Height:      PainSc:        Isolation Precautions Airborne and Contact precautions  Medications Medications  alteplase (ACTIVASE) 1 mg/mL injection (has no administration in time range)  sodium chloride 0.9 % bolus 500 mL (0 mLs Intravenous Stopped 01/08/21 1304)  iohexol (OMNIPAQUE) 350 MG/ML injection 85 mL (85 mLs Intravenous Contrast Given 01/08/21 1534)  cefTRIAXone (ROCEPHIN) 1 g in sodium chloride 0.9 % 100 mL IVPB (0 g Intravenous Stopped 01/08/21 1824)  ALPRAZolam (XANAX) tablet 0.5 mg (0.5 mg Oral Given 01/08/21 1740)  HYDROcodone-acetaminophen (NORCO/VICODIN) 5-325 MG per tablet 1  tablet (1 tablet Oral Given 01/08/21 1740)    Mobility  Low fall risk   Focused Assessments    R Recommendations: See Admitting Provider Note  Report given to:   Additional Notes:

## 2021-01-08 NOTE — ED Notes (Signed)
Pt's daughter is Kyra Manges and can be reached @ [phone].

## 2021-01-08 NOTE — ED Provider Notes (Signed)
[facility] EMERGENCY DEPARTMENT Provider Note   CSN: [id number] Arrival date & time: 01/08/21  1039     History Chief Complaint  Patient presents with   Gastroesophageal Reflux    Stephanie Wood is a 86 y.o. female.  HPI She presents for evaluation of ongoing nausea and fever after being treated for UTI with Macrobid, based on an abnormal urinalysis, by her urologist, 5 days ago.  She was seen yesterday, evaluated and given an injection of Rocephin to help treat the UTI.  Urine grew E. Coli, which was sensitive to Macrodantin.  No recent creatinine available in the EMR.  She denies cough, shortness of breath, focal weakness or paresthesia.  She complains of generalized weakness which makes walking difficult.  She has not been hungry, but is not vomiting.  There has been no diarrhea or constipation.  There are no other known active modifying factors.    Past Medical History:  Diagnosis Date   Acid reflux disease    Anemia    Anxiety    Colonic adenoma    Depression    Irritable bowel syndrome     Patient Active Problem List   Diagnosis Date Noted   Irritable bowel syndrome (IBS) 01/21/2019   Pill dysphagia 01/21/2019   History of colonic polyps 03/19/2014   Osteoporosis, unspecified 07/29/2013   GERD (gastroesophageal reflux disease) 07/29/2013   Other and unspecified hyperlipidemia 07/29/2013   Anxiety state, unspecified 07/29/2013    Past Surgical History:  Procedure Laterality Date   CATARACT EXTRACTION     COLONOSCOPY N/A 04/15/2014   Procedure: COLONOSCOPY;  Surgeon: Rogene Houston, MD;  [facility];  Service: Endoscopy;  Laterality: N/A;  730   ESOPHAGOGASTRODUODENOSCOPY  09/20/2011   Procedure: ESOPHAGOGASTRODUODENOSCOPY (EGD);  Surgeon: Rogene Houston, MD;  Location: [facility];  Service: Endoscopy;  Laterality: N/A;  215   EYE SURGERY     bil catatract surgery     OB History   No obstetric history on file.     Family History  Problem  Relation Age of Onset   Prostate cancer Father     Social History   Tobacco Use   Smoking status: Never   Smokeless tobacco: Never  Vaping Use   Vaping Use: Never used  Substance Use Topics   Alcohol use: No    Alcohol/week: 0.0 standard drinks   Drug use: No    Home Medications Prior to Admission medications   Medication Sig Start Date End Date Taking? Authorizing Provider  ALPRAZolam Duanne Moron) 0.5 MG tablet Take 0.5 mg by mouth at bedtime as needed. Sleep    [provider]  atorvastatin (LIPITOR) 20 MG tablet Take 20 mg by mouth daily.    [provider]  bifidobacterium infantis (ALIGN) capsule Take 1 capsule by mouth daily.    [provider]  Calcium Carbonate (CALCIUM 600 PO) Take by mouth daily.    [provider]  ELDERBERRY PO Take by mouth daily.    [provider]  famotidine (PEPCID) 20 MG tablet Take 20 mg by mouth 2 (two) times daily.    [provider]  FLUTICASONE PROPIONATE, NASAL, NA Place into the nose daily.    [provider]  ipratropium (ATROVENT) 0.06 % nasal spray Place 2 sprays into both nostrils 2 (two) times daily. 11/07/19   [provider]  loperamide (IMODIUM) 2 MG capsule Take 2 mg by mouth daily. Patient states that she takes 1/2 -1 daily.  10/23/17   Rehman, Joline Maxcy, MD  loratadine (CLARITIN) 10 MG tablet Take 10 mg by mouth daily.    [provider]  methenamine (HIPREX) 1 g tablet Take 1 g by mouth 2 (two) times daily. 09/04/20   [provider]  nitrofurantoin, macrocrystal-monohydrate, (MACROBID) 100 MG capsule Take 1 capsule (100 mg total) by mouth 2 (two) times daily for 7 days. 01/04/21 01/11/21  Marcine Matar, MD  ondansetron (ZOFRAN) 4 MG tablet Take 1 tablet (4 mg total) by mouth every 6 (six) hours. 01/07/21   Rennis Harding, PA-C  Pediatric Multivitamins-Iron (FLINTSTONES PLUS IRON) chewable tablet Chew 1 tablet by mouth 2 (two) times a week. 07/22/18    Malissa Hippo, MD  PROLIA 60 MG/ML SOSY injection SMARTSIG:SUB-Q Twice a Year 10/20/19   [provider]  vitamin B-12 (CYANOCOBALAMIN) 1000 MCG tablet Take 1,000 mcg by mouth daily.    [provider]  Vitamin D, Ergocalciferol, (DRISDOL) 50000 UNITS CAPS capsule Take 50,000 Units by mouth every 7 (seven) days. Takes on Saturdays.    [provider]    Allergies    Drug class [trazodone and nefazodone] and No known allergies  Review of Systems   Review of Systems  All other systems reviewed and are negative.  Physical Exam Updated Vital Signs BP 132/79   Pulse 100   Temp 99.6 F (37.6 C)   Resp 16   Ht 5\' 4"  (1.626 m)   Wt 72.3 kg   SpO2 93%   BMI 27.38 kg/m   Physical Exam Vitals and nursing note reviewed.  Constitutional:      General: She is not in acute distress.    Appearance: She is well-developed. She is not ill-appearing, toxic-appearing or diaphoretic.     Comments: Elderly, frail  HENT:     Head: Normocephalic and atraumatic.     Right Ear: External ear normal.     Left Ear: External ear normal.     Mouth/Throat:     Mouth: Mucous membranes are moist.     Pharynx: No oropharyngeal exudate or posterior oropharyngeal erythema.  Eyes:     Conjunctiva/sclera: Conjunctivae normal.     Pupils: Pupils are equal, round, and reactive to light.  Neck:     Trachea: Phonation normal.  Cardiovascular:     Rate and Rhythm: Normal rate and regular rhythm.     Heart sounds: Normal heart sounds.  Pulmonary:     Effort: Pulmonary effort is normal. No respiratory distress.     Breath sounds: Normal breath sounds. No stridor. No rhonchi.  Abdominal:     General: There is no distension.     Palpations: Abdomen is soft.     Tenderness: There is no abdominal tenderness.  Musculoskeletal:        General: Normal range of motion.     Cervical back: Normal range of motion and neck supple.  Skin:    General: Skin is warm and dry.  Neurological:      Mental Status: She is alert and oriented to person, place, and time.     Cranial Nerves: No cranial nerve deficit.     Sensory: No sensory deficit.     Motor: No abnormal muscle tone.     Coordination: Coordination normal.  Psychiatric:        Mood and Affect: Mood normal.        Behavior: Behavior normal.        Thought Content: Thought content normal.  Judgment: Judgment normal.    ED Results / Procedures / Treatments   Labs (all labs ordered are listed, but only abnormal results are displayed) Labs Reviewed - No data to display  EKG None  Radiology No results found.  Procedures Procedures   Medications Ordered in ED Medications - No data to display  ED Course  I have reviewed the triage vital signs and the nursing notes.  Pertinent labs & imaging results that were available during my care of the patient were reviewed by me and considered in my medical decision making (see chart for details).  Clinical Course as of 01/08/21 1620  Sat Jan 08, 2021  1309 Urinalysis normal, transaminases and bilirubin elevated.  Ultrasound ordered. [EW]  1414 No response from radiology regarding ultrasound.  I have just been informed that no one from that department is available.  I will order CT scan of the abdomen pelvis to evaluate transaminitis. [EW]    Clinical Course User Index [EW] Daleen Bo, MD   MDM Rules/Calculators/A&P                            Patient Vitals for the past 24 hrs:  BP Temp Pulse Resp SpO2 Height Weight  01/08/21 1051 -- -- -- -- -- $Rem'5\' 4"'MNGo$  (1.626 m) 72.3 kg  01/08/21 1049 132/79 99.6 F (37.6 C) 100 16 93 % -- --     Medical Decision Making:  This patient is presenting for evaluation of nausea and fever, which does require a range of treatment options, and is a complaint that involves a moderate risk of morbidity and mortality. The differential diagnoses include progressive UTI, metabolic disorder, volume depletion, viral illness. I  decided to review old records, and in summary elderly female presenting with anorexia, nausea and fever, while being treated for UTI.  History of chronic UTIs previously treated with suppression therapy.  Additional history, IBS.  I did not require additional historical information from anyone.  Clinical Laboratory Tests Ordered, included CBC, Metabolic panel, Urinalysis, and viral panel . Review indicates normal except sodium low, glucose high, calcium low, albumin low, AST high, ALT high, alk phos stays high, total bilirubin high. Radiologic Tests Ordered, included CT abdomen pelvis.    Critical Interventions-clinical evaluation, laboratory testing, CT imaging, observation and reassessment  After These Interventions, the Patient was reevaluated and was found with concerning transaminitis, presenting with nausea and fever.  Urinalysis, currently being treated, appears to have resolved.  Disposition pending CT imaging  CRITICAL CARE-no Performed by: Daleen Bo  Nursing Notes Reviewed/ Care Coordinated Applicable Imaging Reviewed Interpretation of Laboratory Data incorporated into ED treatment  Plan disposition by Dr. Sabra Heck after return of CT imaging    Final Clinical Impression(s) / ED Diagnoses Final diagnoses:  Nausea  Transaminitis    Rx / DC Orders ED Discharge Orders     None        Daleen Bo, MD 01/08/21 1622

## 2021-01-08 NOTE — ED Provider Notes (Signed)
At change of shift - care signed out from Dr. Eulis Foster,  This patient has had some nausea, recently diagnosed with urinary tract infection, given Rocephin at urgent care yesterday.  She has what appears to be a normal white blood cell count, her urinalysis is actually very unremarkable without any signs of infection.  Her metabolic panel does show that she has a transaminitis but thankfully the CT scan does not show a cause for this, she has LFTs around 195, 251 and a bilirubin of 3.3.  I discussed the case with Dr. Abbey Chatters of the GI service who agrees that the patient should be admitted to the hospital he will see her in the morning, request ultrasound be ordered for tomorrow, discussed with Dr. Nehemiah Settle of the hospitalist service who will admit   Noemi Chapel, MD 01/08/21 1709

## 2021-01-08 NOTE — ED Notes (Signed)
Patient transported to CT 

## 2021-01-09 ENCOUNTER — Inpatient Hospital Stay (HOSPITAL_COMMUNITY): Payer: PPO

## 2021-01-09 DIAGNOSIS — K921 Melena: Secondary | ICD-10-CM

## 2021-01-09 DIAGNOSIS — R110 Nausea: Principal | ICD-10-CM

## 2021-01-09 DIAGNOSIS — R7989 Other specified abnormal findings of blood chemistry: Secondary | ICD-10-CM | POA: Diagnosis not present

## 2021-01-09 DIAGNOSIS — K219 Gastro-esophageal reflux disease without esophagitis: Secondary | ICD-10-CM | POA: Diagnosis not present

## 2021-01-09 LAB — CBC
HCT: 37.4 % (ref 36.0–46.0)
Hemoglobin: 12 g/dL (ref 12.0–15.0)
MCH: 27.8 pg (ref 26.0–34.0)
MCHC: 32.1 g/dL (ref 30.0–36.0)
MCV: 86.8 fL (ref 80.0–100.0)
Platelets: 148 10*3/uL — ABNORMAL LOW (ref 150–400)
RBC: 4.31 MIL/uL (ref 3.87–5.11)
RDW: 14.7 % (ref 11.5–15.5)
WBC: 6.3 10*3/uL (ref 4.0–10.5)
nRBC: 0 % (ref 0.0–0.2)

## 2021-01-09 LAB — HEPATITIS PANEL, ACUTE
HCV Ab: NONREACTIVE
Hep A IgM: NONREACTIVE
Hep B C IgM: NONREACTIVE
Hepatitis B Surface Ag: NONREACTIVE

## 2021-01-09 LAB — PROTIME-INR
INR: 0.9 (ref 0.8–1.2)
Prothrombin Time: 12.1 seconds (ref 11.4–15.2)

## 2021-01-09 LAB — COMPREHENSIVE METABOLIC PANEL
ALT: 172 U/L — ABNORMAL HIGH (ref 0–44)
AST: 115 U/L — ABNORMAL HIGH (ref 15–41)
Albumin: 2.6 g/dL — ABNORMAL LOW (ref 3.5–5.0)
Alkaline Phosphatase: 180 U/L — ABNORMAL HIGH (ref 38–126)
Anion gap: 8 (ref 5–15)
BUN: 15 mg/dL (ref 8–23)
CO2: 26 mmol/L (ref 22–32)
Calcium: 8 mg/dL — ABNORMAL LOW (ref 8.9–10.3)
Chloride: 102 mmol/L (ref 98–111)
Creatinine, Ser: 0.78 mg/dL (ref 0.44–1.00)
GFR, Estimated: >60 mL/min (ref >60)
Glucose, Bld: 92 mg/dL (ref 70–99)
Potassium: 3.5 mmol/L (ref 3.5–5.1)
Sodium: 136 mmol/L (ref 135–145)
Total Bilirubin: 3.2 mg/dL — ABNORMAL HIGH (ref 0.3–1.2)
Total Protein: 5.5 g/dL — ABNORMAL LOW (ref 6.5–8.1)

## 2021-01-09 LAB — IRON AND TIBC
Iron: 18 ug/dL — ABNORMAL LOW (ref 28–170)
Saturation Ratios: 8 % — ABNORMAL LOW (ref 10.4–31.8)
TIBC: 219 ug/dL — ABNORMAL LOW (ref 250–450)
UIBC: 201 ug/dL

## 2021-01-09 NOTE — Plan of Care (Signed)

## 2021-01-09 NOTE — Consult Note (Signed)
Consulting  Provider: Dr. Sabra Heck Primary Care Physician:  [name], MD Primary Gastroenterologist:  Dr. Laural Golden  Reason for Consultation:  Abnormal LFTs  HPI:  Stephanie Wood is a 86 y.o. female with a past medical history of GERD, IBS diarrhea predominant, anxiety, who presented to [facility] ER yesterday evening with fevers, abdominal discomfort.  This started approximately 4 to 5 days ago.  She was diagnosed with UTI by urology and was prescribed Macrobid.  She states she took approximately 6 doses of this.  She continued to have fevers and reported to urgent care and got a shot of Rocephin.  Her symptoms did not improve and she decided to present to the ER.  In the ER, patient was found to have abnormal liver function test including AST 195, ALT 251, T bili 3.3 alk phos 178.  INR WNL.  These are slightly improved today.  She had a CT abdomen pelvis in the ER which was largely unremarkable from a GI standpoint.  No gallstones or gallbladder wall thickening, no biliary dilatation.  She underwent ultrasound this morning and we are currently awaiting his results.  Denies any personal family history of liver disease.  No new medications besides antibiotics as above.  No alcohol use.  No IV drug use.  Has been taking Tylenol for the last 2 to 3 days approximately 4 g daily.  Denies any cough, shortness of breath, sore throat.  Patient does report 1 episode of black tarry stool.  Does states she takes chronic iron though has not taken this in over a week.  Hemoglobin did drop slightly from 13.5-12 today.  Past Medical History:  Diagnosis Date   Acid reflux disease    Anemia    Anxiety    Colonic adenoma    Depression    Irritable bowel syndrome     Past Surgical History:  Procedure Laterality Date   CATARACT EXTRACTION     COLONOSCOPY N/A 04/15/2014   Procedure: COLONOSCOPY;  Surgeon: Rogene Houston, MD;  Location: [facility];  Service: Endoscopy;  Laterality: N/A;  730    ESOPHAGOGASTRODUODENOSCOPY  09/20/2011   Procedure: ESOPHAGOGASTRODUODENOSCOPY (EGD);  Surgeon: Rogene Houston, MD;  Location: [facility];  Service: Endoscopy;  Laterality: N/A;  215   EYE SURGERY     bil catatract surgery    Prior to Admission medications   Medication Sig Start Date End Date Taking? Authorizing Provider  ALPRAZolam Duanne Moron) 0.5 MG tablet Take 0.5 mg by mouth at bedtime as needed for sleep or anxiety. Sleep   Yes [provider]  atorvastatin (LIPITOR) 20 MG tablet Take 20 mg by mouth daily.   Yes [provider]  bifidobacterium infantis (ALIGN) capsule Take 1 capsule by mouth daily.   Yes [provider]  Calcium Carbonate (CALCIUM 600 PO) Take 1 capsule by mouth daily.   Yes [provider]  ELDERBERRY PO Take 1 capsule by mouth daily.   Yes [provider]  famotidine (PEPCID) 20 MG tablet Take 20 mg by mouth 2 (two) times daily.   Yes [provider]  FLUTICASONE PROPIONATE, NASAL, NA Place 1 spray into the nose daily as needed (rhinitis).   Yes [provider]  ipratropium (ATROVENT) 0.06 % nasal spray Place 2 sprays into both nostrils 2 (two) times daily as needed for rhinitis. 11/07/19  Yes [provider]  loperamide (IMODIUM) 2 MG capsule Take 2 mg by mouth daily as needed for diarrhea or loose stools.  10/23/17  Yes Rehman, Mechele Dawley, MD  loratadine (CLARITIN) 10 MG tablet Take 10 mg by mouth daily.   Yes [provider]  methenamine (HIPREX) 1 g tablet Take 1 g by mouth 2 (two) times daily. 09/04/20  Yes [provider]  nitrofurantoin, macrocrystal-monohydrate, (MACROBID) 100 MG capsule Take 1 capsule (100 mg total) by mouth 2 (two) times daily for 7 days. 01/04/21 01/11/21 Yes Dahlstedt, Annie Main, MD  ondansetron (ZOFRAN) 4 MG tablet Take 1 tablet (4 mg total) by mouth every 6 (six) hours. Patient taking differently: Take 4 mg by mouth every 6 (six) hours as needed for nausea or  vomiting. 01/07/21  Yes Wurst, [place], PA-C  Pediatric Multivitamins-Iron (FLINTSTONES PLUS IRON) chewable tablet Chew 1 tablet by mouth 2 (two) times a week. 07/22/18  Yes Rehman, Mechele Dawley, MD  PROLIA 60 MG/ML SOSY injection SMARTSIG:SUB-Q Twice a Year 10/20/19  Yes [provider]  vitamin B-12 (CYANOCOBALAMIN) 1000 MCG tablet Take 1,000 mcg by mouth daily.   Yes [provider]  Vitamin D, Ergocalciferol, (DRISDOL) 50000 UNITS CAPS capsule Take 50,000 Units by mouth every 7 (seven) days. Takes on Saturdays.   Yes [provider]    Current Facility-Administered Medications  Medication Dose Route Frequency Provider Last Rate Last Admin   0.9 %  sodium chloride infusion   Intravenous Continuous Truett Mainland, DO       0.9 %  sodium chloride infusion   Intravenous Continuous Truett Mainland, DO 75 mL/hr at 01/09/21 4174 Infusion Verify at 01/09/21 0814   ALPRAZolam (XANAX) tablet 0.5 mg  0.5 mg Oral QHS PRN Truett Mainland, DO       atorvastatin (LIPITOR) tablet 20 mg  20 mg Oral QPM Stinson, Jacob J, DO       cefTRIAXone (ROCEPHIN) 2 g in sodium chloride 0.9 % 100 mL IVPB  2 g Intravenous Q24H Stinson, Jacob J, DO       HYDROcodone-acetaminophen (NORCO/VICODIN) 5-325 MG per tablet 1 tablet  1 tablet Oral Q4H PRN Truett Mainland, DO       loperamide (IMODIUM) capsule 2 mg  2 mg Oral Daily PRN Truett Mainland, DO       methenamine (MANDELAMINE) tablet 1 g  1 g Oral BID Truett Mainland, DO   1 g at 01/09/21 1116   ondansetron (ZOFRAN) tablet 4 mg  4 mg Oral Q6H PRN Truett Mainland, DO       Or   ondansetron [facility]) injection 4 mg  4 mg Intravenous Q6H PRN Truett Mainland, DO       ondansetron [facility]) tablet 4 mg  4 mg Oral Q6H PRN Truett Mainland, DO       pantoprazole (PROTONIX) injection 40 mg  40 mg Intravenous Q24H Truett Mainland, DO   40 mg at 01/08/21 2314   vitamin B-12 (CYANOCOBALAMIN) tablet 1,000 mcg  1,000 mcg Oral Daily Truett Mainland, DO    1,000 mcg at 01/09/21 4818    Allergies as of 01/08/2021 - Review Complete 01/08/2021  Allergen Reaction Noted   Drug class [trazodone and nefazodone]  06/29/2020   No known allergies  06/29/2020    Family History  Problem Relation Age of Onset   Prostate cancer Father     Social History   Socioeconomic History   Marital status: Widowed    Spouse name: Not on file   Number of children: 1   Years of education: Not on file  Highest education level: Not on file  Occupational History   Occupation: reitred  Tobacco Use   Smoking status: Never   Smokeless tobacco: Never  Vaping Use   Vaping Use: Never used  Substance and Sexual Activity   Alcohol use: No    Alcohol/week: 0.0 standard drinks   Drug use: No   Sexual activity: Not on file  Other Topics Concern   Not on file  Social History Narrative   Not on file   Social Determinants of Health   Financial Resource Strain: Not on file  Food Insecurity: Not on file  Transportation Needs: Not on file  Physical Activity: Not on file  Stress: Not on file  Social Connections: Not on file  Intimate Partner Violence: Not on file    Review of Systems: General: Negative for anorexia, weight loss, fever, chills, fatigue, weakness. Eyes: Negative for vision changes.  ENT: Negative for hoarseness, difficulty swallowing , nasal congestion. CV: Negative for chest pain, angina, palpitations, dyspnea on exertion, peripheral edema.  Respiratory: Negative for dyspnea at rest, dyspnea on exertion, cough, sputum, wheezing.  GI: See history of present illness. GU:  Negative for dysuria, hematuria, urinary incontinence, urinary frequency, nocturnal urination.  MS: Negative for joint pain, low back pain.  Derm: Negative for rash or itching.  Neuro: Negative for weakness, abnormal sensation, seizure, frequent headaches, memory loss, confusion.  Psych: Negative for anxiety, depression Endo: Negative for unusual weight change.  Heme:  Negative for bruising or bleeding. Allergy: Negative for rash or hives.  Physical Exam: Vital signs in last 24 hours: Temp:  [98.3 F (36.8 C)-99.3 F (37.4 C)] 98.6 F (37 C) (08/14 1106) Pulse Rate:  [75-98] 83 (08/14 1106) Resp:  [16-25] 17 (08/14 0943) BP: (89-135)/(48-85) 108/58 (08/14 1106) SpO2:  [90 %-99 %] 91 % (08/14 1106) FiO2 (%):  [21 %] 21 % (08/13 2250) Weight:  [71.9 kg] 71.9 kg (08/13 2250) Last BM Date: 01/09/21 General:   Alert,  Well-developed, well-nourished, pleasant and cooperative in NAD Head:  Normocephalic and atraumatic. Eyes:  Sclera clear, no icterus.   Conjunctiva pink. Ears:  Normal auditory acuity. Nose:  No deformity, discharge,  or lesions. Mouth:  No deformity or lesions, dentition normal. Neck:  Supple; no masses or thyromegaly. Lungs:  Clear throughout to auscultation.   No wheezes, crackles, or rhonchi. No acute distress. Heart:  Regular rate and rhythm; no murmurs, clicks, rubs,  or gallops. Abdomen:  Soft, nondistended, tender to palpation right upper quadrant. No masses, hepatosplenomegaly or hernias noted. Normal bowel sounds, without guarding, and without rebound.   Msk:  Symmetrical without gross deformities. Normal posture. Pulses:  Normal pulses noted. Extremities:  Without clubbing or edema. Neurologic:  Alert and  oriented x4;  grossly normal neurologically. Skin:  Intact without significant lesions or rashes. Cervical Nodes:  No significant cervical adenopathy. Psych:  Alert and cooperative. Normal mood and affect.  Intake/Output from previous day: 08/13 0701 - 08/14 0700 In: 1057.6 [I.V.:453.5; IV Piggyback:604.1] Out: -  Intake/Output this shift: Total I/O In: 388.8 [P.O.:240; I.V.:148.8] Out: -   Lab Results: Recent Labs    01/08/21 1140 01/09/21 0446  WBC 10.4 6.3  HGB 13.5 12.0  HCT 40.7 37.4  PLT 153 148*   BMET Recent Labs    01/08/21 1140 01/09/21 0446  NA 130* 136  K 3.6 3.5  CL 98 102  CO2 22 26   GLUCOSE 129* 92  BUN 22 15  CREATININE 0.91 0.78  CALCIUM 8.3*  8.0*   LFT Recent Labs    01/08/21 1140 01/09/21 0446  PROT 6.6 5.5*  ALBUMIN 3.1* 2.6*  AST 195* 115*  ALT 251* 172*  ALKPHOS 178* 180*  BILITOT 3.3* 3.2*   PT/INR Recent Labs    01/09/21 0446  LABPROT 12.1  INR 0.9   Hepatitis Panel No results for input(s): HEPBSAG, HCVAB, HEPAIGM, HEPBIGM in the last 72 hours. C-Diff No results for input(s): CDIFFTOX in the last 72 hours.  Studies/Results: CT Abdomen Pelvis W Contrast  Result Date: 01/08/2021 CLINICAL DATA:  Abdominal abscess/infection suspected. Urinary tract infection. Epigastric pain EXAM: CT ABDOMEN AND PELVIS WITH CONTRAST TECHNIQUE: Multidetector CT imaging of the abdomen and pelvis was performed using the standard protocol following bolus administration of intravenous contrast. CONTRAST:  41mL OMNIPAQUE IOHEXOL 350 MG/ML SOLN COMPARISON:  12/28/2016, 12/20/2018 FINDINGS: Lower chest: Bibasilar atelectasis. Heart size is within normal limits. Incompletely visualized large hiatal hernia containing the majority of the stomach. Hepatobiliary: No focal liver abnormality is seen. No gallstones, gallbladder wall thickening, or biliary dilatation. Pancreas: Unremarkable. No pancreatic ductal dilatation or surrounding inflammatory changes. Spleen: Normal in size without focal abnormality. Adrenals/Urinary Tract: Unremarkable adrenal glands. Two left renal cysts, largest measuring up to 2.2 cm. Subcentimeter low-density lesions within the right kidney which are too small to definitively characterize, but most likely represent cysts. Kidneys have otherwise symmetric enhancement. No renal [place] or hydronephrosis. Mildly distended bladder with a single bubble of air within the bladder lumen. No appreciable wall thickening. Stomach/Bowel: Stomach is predominantly intrathoracic. Small bowel appears within normal limits. Appendix appears normal (series 2, image 47). No  evidence of bowel wall thickening, distention, or inflammatory changes. Vascular/Lymphatic: Scattered aortoiliac atherosclerotic calcifications without aneurysm. No abdominopelvic lymphadenopathy. Reproductive: Uterus and bilateral adnexa are unremarkable. Other: No free fluid. No abdominopelvic fluid collection. No pneumoperitoneum. Small fat containing right inguinal hernia. Musculoskeletal: Severe compression fracture of the L1 vertebral body, which appears chronic although has progressed from previous radiographs of 12/20/2018. No additional significant bony findings. IMPRESSION: 1. No acute abdominopelvic findings. 2. Single bubble of air within the bladder lumen. Correlate for recent instrumentation. 3. Severe compression fracture of the L1 vertebral body, which appears chronic although has progressed from previous radiographs of 12/20/2018. 4. Incompletely visualized large hiatal hernia containing the majority of the stomach. Aortic Atherosclerosis (ICD10-I70.0). Electronically Signed   By: Davina Poke D.O.   On: 01/08/2021 16:23    Impression: *Acute hepatitis *Abdominal pain *Melena *GERD *IBS-diarrhea predominant  Plan: Etiology of patient's acute hepatitis unclear.  Macrobid classically known for drug-induced hepatitis.  Would continue to hold and avoid this medication going forward.    I will perform serological work-up including ANA, ASMA, viral hepatitis panel, AMA, iron/Tran saturation.  We will follow-up on ultrasound findings.  Patient needs daily CMP and coags.  Continue to monitor hemoglobin.  If further episode of melena or drop in her hemoglobin, can consider EGD tomorrow.  Please make n.p.o. after midnight tonight just in case.  We will change her IV Protonix to twice daily.  Thank you for the consultation, GI to continue to follow.  Elon Alas. Abbey Chatters, D.O. Gastroenterology and Hepatology [facility]    LOS: 1 day     01/09/2021,  12:05 PM

## 2021-01-09 NOTE — Progress Notes (Signed)
PROGRESS NOTE    Stephanie Wood  [id number] DOB: [birth year] DOA: 01/08/2021 PCP: [name], MD    Brief Narrative:  86 y.o. female with a history of GERD, anxiety, IBS. Patient presents with fevers and abdominal discomfort that started about 4 days ago. She was diagnosed with a UTI by urology a day prior, was prescribed macrobid. She took two doses that day, then had a fever the next morning.  Fevers continued daily, improved with Tylenol.  No provoking factors.  She continued to take Macrobid was unable to take any of her other medications.  She was seen at the urgent care yesterday and given a dose of Rocephin.  As her symptoms continue to not improve, the patient presented here for evaluation  Assessment & Plan:   Principal Problem:   Elevated LFTs Active Problems:   GERD (gastroesophageal reflux disease)   Anxiety state   Irritable bowel syndrome (IBS)   Nausea   Melena    Elevated LFTs CT abd reviewed, no acute hepatobiliary process identified LFT's are trending down Hepatitis panel and liver serologies are pending Agree with holding further macrobid, see below Avoid hepatotoxic agents Repeat CMP in AM GI is following, appreciate recs UTI from 8/9 ruled in Recent UA reviewed. Found to have >30 WBC's 2+ leuks, mod bacteria, neg nitrites Continue Rocephin Check urine culture Tylenol for fever IBS Seems to be stable at this time Anxiety Affect appeared stable this AM Continue xanax as needed GERD Continue protonix    DVT prophylaxis: SCD's Code Status: Full Family Communication: Pt in room, family not at bedside  Status is: Inpatient  Remains inpatient appropriate because:Inpatient level of care appropriate due to severity of illness  Dispo: The patient is from: Home              Anticipated d/c is to: Home              Patient currently is not medically stable to d/c.   Difficult to place patient No       Consultants:  GI  Procedures:     Antimicrobials: Anti-infectives (From admission, onward)    Start     Dose/Rate Route Frequency Ordered Stop   01/09/21 1700  cefTRIAXone (ROCEPHIN) 2 g in sodium chloride 0.9 % 100 mL IVPB        2 g 200 mL/hr over 30 Minutes Intravenous Every 24 hours 01/08/21 2249     01/08/21 2345  methenamine (MANDELAMINE) tablet 1 g        1 g Oral 2 times daily 01/08/21 2249     01/08/21 1715  cefTRIAXone (ROCEPHIN) 1 g in sodium chloride 0.9 % 100 mL IVPB        1 g 200 mL/hr over 30 Minutes Intravenous  Once 01/08/21 1700 01/08/21 1824       Subjective: Complaining of generalized abd discomfort  Objective: Vitals:   01/09/21 0251 01/09/21 0630 01/09/21 0943 01/09/21 1106  BP: (!) 97/48 (!) 103/54 (!) 89/48 (!) 108/58  Pulse: 75 94 86 83  Resp: '16 16 17   '$ Temp: 99.3 F (37.4 C) 99 F (37.2 C) 98.3 F (36.8 C) 98.6 F (37 C)  TempSrc:   Oral Oral  SpO2: 97% 90% 91% 91%  Weight:      Height:        Intake/Output Summary (Last 24 hours) at 01/09/2021 1335 Last data filed at 01/09/2021 0900 Gross per 24 hour  Intake 942.2 ml  Output --  Net 942.2 ml   Filed Weights   01/08/21 1051 01/08/21 2250  Weight: 72.3 kg 71.9 kg    Examination: General exam: Awake, laying in bed, in nad Respiratory system: Normal respiratory effort, no wheezing Cardiovascular system: regular rate, s1, s2 Gastrointestinal system: Soft, nondistended, positive BS Central nervous system: CN2-12 grossly intact, strength intact Extremities: Perfused, no clubbing Skin: Normal skin turgor, no notable skin lesions seen Psychiatry: Mood normal // no visual hallucinations   Data Reviewed: I have personally reviewed following labs and imaging studies  CBC: Recent Labs  Lab 01/08/21 1140 01/09/21 0446  WBC 10.4 6.3  NEUTROABS 9.1*  --   HGB 13.5 12.0  HCT 40.7 37.4  MCV 85.1 86.8  PLT 153 [id number]*   Basic Metabolic Panel: Recent Labs  Lab 01/08/21 1140 01/09/21 0446  NA 130* 136  K 3.6 3.5   CL 98 102  CO2 22 26  GLUCOSE 129* 92  BUN 22 15  CREATININE 0.91 0.78  CALCIUM 8.3* 8.0*   GFR: Estimated Creatinine Clearance: 50.9 mL/min (by C-G formula based on SCr of 0.78 mg/dL). Liver Function Tests: Recent Labs  Lab 01/08/21 1140 01/09/21 0446  AST 195* 115*  ALT 251* 172*  ALKPHOS 178* 180*  BILITOT 3.3* 3.2*  PROT 6.6 5.5*  ALBUMIN 3.1* 2.6*   Recent Labs  Lab 01/08/21 1140  LIPASE 38   No results for input(s): AMMONIA in the last 168 hours. Coagulation Profile: Recent Labs  Lab 01/09/21 0446  INR 0.9   Cardiac Enzymes: No results for input(s): CKTOTAL, CKMB, CKMBINDEX, TROPONINI in the last 168 hours. BNP (last 3 results) No results for input(s): PROBNP in the last 8760 hours. HbA1C: No results for input(s): HGBA1C in the last 72 hours. CBG: No results for input(s): GLUCAP in the last 168 hours. Lipid Profile: No results for input(s): CHOL, HDL, LDLCALC, TRIG, CHOLHDL, LDLDIRECT in the last 72 hours. Thyroid Function Tests: No results for input(s): TSH, T4TOTAL, FREET4, T3FREE, THYROIDAB in the last 72 hours. Anemia Panel: No results for input(s): VITAMINB12, FOLATE, FERRITIN, TIBC, IRON, RETICCTPCT in the last 72 hours. Sepsis Labs: No results for input(s): PROCALCITON, LATICACIDVEN in the last 168 hours.  Recent Results (from the past 240 hour(s))  Microscopic Examination     Status: Abnormal   Collection Time: 01/04/21  2:28 PM   Urine  Result Value Ref Range Status   WBC, UA >30 (A) 0 - 5 /hpf Final   RBC 0-2 0 - 2 /hpf Final   Epithelial Cells (non renal) 0-10 0 - 10 /hpf Final   Renal Epithel, UA None seen None seen /hpf Final   Casts None seen None seen /lpf Final   Bacteria, UA Moderate (A) None seen/Few Final  Urine Culture     Status: Abnormal   Collection Time: 01/04/21  4:41 PM   Specimen: Urine, Clean Catch   UR  Result Value Ref Range Status   Urine Culture, Routine Final report (A)  Final   Organism ID, Bacteria  Escherichia coli (A)  Final    Comment: Cefazolin <=4 ug/mL Cefazolin with an MIC <=16 predicts susceptibility to the oral agents cefaclor, cefdinir, cefpodoxime, cefprozil, cefuroxime, cephalexin, and loracarbef when used for therapy of uncomplicated urinary tract infections due to E. coli, Klebsiella pneumoniae, and Proteus mirabilis. 50,000-100,000 colony forming units per mL    Antimicrobial Susceptibility Comment  Final    Comment:       ** S = Susceptible; I = Intermediate; R =  Resistant **                    P = Positive; N = Negative             MICS are expressed in micrograms per mL    Antibiotic                 RSLT#1    RSLT#2    RSLT#3    RSLT#4 Amoxicillin/Clavulanic Acid    S Ampicillin                     S Cefepime                       S Ceftriaxone                    S Cefuroxime                     S Ciprofloxacin                  S Ertapenem                      S Gentamicin                     S Imipenem                       S Levofloxacin                   S Meropenem                      S Nitrofurantoin                 S Piperacillin/Tazobactam        S Tetracycline                   S Tobramycin                     S Trimethoprim/Sulfa             S   Resp Panel by RT-PCR (Flu A&B, Covid) Nasopharyngeal Swab     Status: None   Collection Time: 01/08/21 11:15 AM   Specimen: Nasopharyngeal Swab; Nasopharyngeal(NP) swabs in vial transport medium  Result Value Ref Range Status   SARS Coronavirus 2 by RT PCR NEGATIVE NEGATIVE Final    Comment: (NOTE) SARS-CoV-2 target nucleic acids are NOT DETECTED.  The SARS-CoV-2 RNA is generally detectable in upper respiratory specimens during the acute phase of infection. The lowest concentration of SARS-CoV-2 viral copies this assay can detect is 138 copies/mL. A negative result does not preclude SARS-Cov-2 infection and should not be used as the sole basis for treatment or other patient management decisions. A  negative result may occur with  improper specimen collection/handling, submission of specimen other than nasopharyngeal swab, presence of viral mutation(s) within the areas targeted by this assay, and inadequate number of viral copies(<138 copies/mL). A negative result must be combined with clinical observations, patient history, and epidemiological information. The expected result is Negative.  Fact Sheet for Patients:  EntrepreneurPulse.com.au  Fact Sheet for Healthcare Providers:  IncredibleEmployment.be  This test is no t yet approved or cleared by the [place] FDA and  has been authorized for detection and/or  diagnosis of SARS-CoV-2 by FDA under an Emergency Use Authorization (EUA). This EUA will remain  in effect (meaning this test can be used) for the duration of the COVID-19 declaration under Section 564(b)(1) of the Act, 21 U.S.C.section 360bbb-3(b)(1), unless the authorization is terminated  or revoked sooner.       Influenza A by PCR NEGATIVE NEGATIVE Final   Influenza B by PCR NEGATIVE NEGATIVE Final    Comment: (NOTE) The Xpert Xpress SARS-CoV-2/FLU/RSV plus assay is intended as an aid in the diagnosis of influenza from Nasopharyngeal swab specimens and should not be used as a sole basis for treatment. Nasal washings and aspirates are unacceptable for Xpert Xpress SARS-CoV-2/FLU/RSV testing.  Fact Sheet for Patients: EntrepreneurPulse.com.au  Fact Sheet for Healthcare Providers: IncredibleEmployment.be  This test is not yet approved or cleared by the [place] FDA and has been authorized for detection and/or diagnosis of SARS-CoV-2 by FDA under an Emergency Use Authorization (EUA). This EUA will remain in effect (meaning this test can be used) for the duration of the COVID-19 declaration under Section 564(b)(1) of the Act, 21 U.S.C. section 360bbb-3(b)(1), unless the authorization  is terminated or revoked.  Performed at [facility], [address] [place], [place] [address]      Radiology Studies: US Abdomen Complete  Result Date: 01/09/2021 CLINICAL DATA:  RIGHT upper quadrant pain EXAM: ABDOMEN ULTRASOUND COMPLETE COMPARISON:  CT 01/08/2021 FINDINGS: Gallbladder: Gallbladder is is collapsed. Gallbladder wall is thickened to 6 mm. No gallstones. No pericholecystic fluid. Negative sonographic Murphy's sign Common bile duct: Diameter: Upper limits of normal at 6 mm Liver: No focal lesion identified. Within normal limits in parenchymal echogenicity. Portal vein is patent on color Doppler imaging with normal direction of blood flow towards the liver. IVC: No abnormality visualized. Pancreas: Not visualized due to bowel gas. Spleen: Size and appearance within normal limits. Right Kidney: Length: 9.7 cm. Echogenicity within normal limits. No mass or hydronephrosis visualized. Left Kidney: Length: 10.8 cm.  Two benign appearing anechoic cysts. Abdominal aorta: No aneurysm visualized. Other findings: None. IMPRESSION: 1. No acute abdominal findings by ultrasound. 2. No gallstones. 3. Normal liver and biliary tree. 4. Benign appearing  LEFT renal cysts. Electronically Signed   By: Suzy Bouchard M.D.   On: 01/09/2021 12:31   CT Abdomen Pelvis W Contrast  Result Date: 01/08/2021 CLINICAL DATA:  Abdominal abscess/infection suspected. Urinary tract infection. Epigastric pain EXAM: CT ABDOMEN AND PELVIS WITH CONTRAST TECHNIQUE: Multidetector CT imaging of the abdomen and pelvis was performed using the standard protocol following bolus administration of intravenous contrast. CONTRAST:  75m OMNIPAQUE IOHEXOL 350 MG/ML SOLN COMPARISON:  12/28/2016, 12/20/2018 FINDINGS: Lower chest: Bibasilar atelectasis. Heart size is within normal limits. Incompletely visualized large hiatal hernia containing the majority of the stomach. Hepatobiliary: No focal liver abnormality is seen. No gallstones,  gallbladder wall thickening, or biliary dilatation. Pancreas: Unremarkable. No pancreatic ductal dilatation or surrounding inflammatory changes. Spleen: Normal in size without focal abnormality. Adrenals/Urinary Tract: Unremarkable adrenal glands. Two left renal cysts, largest measuring up to 2.2 cm. Subcentimeter low-density lesions within the right kidney which are too small to definitively characterize, but most likely represent cysts. Kidneys have otherwise symmetric enhancement. No renal Scheper or hydronephrosis. Mildly distended bladder with a single bubble of air within the bladder lumen. No appreciable wall thickening. Stomach/Bowel: Stomach is predominantly intrathoracic. Small bowel appears within normal limits. Appendix appears normal (series 2, image 47). No evidence of bowel wall thickening, distention, or inflammatory changes. Vascular/Lymphatic: Scattered aortoiliac  atherosclerotic calcifications without aneurysm. No abdominopelvic lymphadenopathy. Reproductive: Uterus and bilateral adnexa are unremarkable. Other: No free fluid. No abdominopelvic fluid collection. No pneumoperitoneum. Small fat containing right inguinal hernia. Musculoskeletal: Severe compression fracture of the L1 vertebral body, which appears chronic although has progressed from previous radiographs of 12/20/2018. No additional significant bony findings. IMPRESSION: 1. No acute abdominopelvic findings. 2. Single bubble of air within the bladder lumen. Correlate for recent instrumentation. 3. Severe compression fracture of the L1 vertebral body, which appears chronic although has progressed from previous radiographs of 12/20/2018. 4. Incompletely visualized large hiatal hernia containing the majority of the stomach. Aortic Atherosclerosis (ICD10-I70.0). Electronically Signed   By: Davina Poke D.O.   On: 01/08/2021 16:23    Scheduled Meds:  atorvastatin  20 mg Oral QPM   methenamine  1 g Oral BID   pantoprazole (PROTONIX) IV   40 mg Intravenous Q24H   vitamin B-12  1,000 mcg Oral Daily   Continuous Infusions:  sodium chloride     sodium chloride 75 mL/hr at 01/09/21 0718   cefTRIAXone (ROCEPHIN)  IV       LOS: 1 day   Marylu Lund, MD [facility] Pager On Amion  If 7PM-7AM, please contact night-coverage 01/09/2021, 1:35 PM

## 2021-01-10 DIAGNOSIS — K219 Gastro-esophageal reflux disease without esophagitis: Secondary | ICD-10-CM | POA: Diagnosis not present

## 2021-01-10 DIAGNOSIS — R7989 Other specified abnormal findings of blood chemistry: Secondary | ICD-10-CM | POA: Diagnosis not present

## 2021-01-10 LAB — CBC WITH DIFFERENTIAL/PLATELET
Abs Immature Granulocytes: 0.04 10*3/uL (ref 0.00–0.07)
Basophils Absolute: 0 10*3/uL (ref 0.0–0.1)
Basophils Relative: 1 %
Eosinophils Absolute: 0.2 10*3/uL (ref 0.0–0.5)
Eosinophils Relative: 3 %
HCT: 36 % (ref 36.0–46.0)
Hemoglobin: 11.5 g/dL — ABNORMAL LOW (ref 12.0–15.0)
Immature Granulocytes: 1 %
Lymphocytes Relative: 21 %
Lymphs Abs: 1.3 10*3/uL (ref 0.7–4.0)
MCH: 27.8 pg (ref 26.0–34.0)
MCHC: 31.9 g/dL (ref 30.0–36.0)
MCV: 87 fL (ref 80.0–100.0)
Monocytes Absolute: 0.5 10*3/uL (ref 0.1–1.0)
Monocytes Relative: 7 %
Neutro Abs: 4.3 10*3/uL (ref 1.7–7.7)
Neutrophils Relative %: 67 %
Platelets: 155 10*3/uL (ref 150–400)
RBC: 4.14 MIL/uL (ref 3.87–5.11)
RDW: 15.2 % (ref 11.5–15.5)
WBC: 6.3 10*3/uL (ref 4.0–10.5)
nRBC: 0 % (ref 0.0–0.2)

## 2021-01-10 LAB — COMPREHENSIVE METABOLIC PANEL
ALT: 175 U/L — ABNORMAL HIGH (ref 0–44)
AST: 185 U/L — ABNORMAL HIGH (ref 15–41)
Albumin: 2.4 g/dL — ABNORMAL LOW (ref 3.5–5.0)
Alkaline Phosphatase: 268 U/L — ABNORMAL HIGH (ref 38–126)
Anion gap: 5 (ref 5–15)
BUN: 12 mg/dL (ref 8–23)
CO2: 27 mmol/L (ref 22–32)
Calcium: 7.7 mg/dL — ABNORMAL LOW (ref 8.9–10.3)
Chloride: 106 mmol/L (ref 98–111)
Creatinine, Ser: 0.82 mg/dL (ref 0.44–1.00)
GFR, Estimated: >60 mL/min (ref >60)
Glucose, Bld: 108 mg/dL — ABNORMAL HIGH (ref 70–99)
Potassium: 3.5 mmol/L (ref 3.5–5.1)
Sodium: 138 mmol/L (ref 135–145)
Total Bilirubin: 2.5 mg/dL — ABNORMAL HIGH (ref 0.3–1.2)
Total Protein: 5.4 g/dL — ABNORMAL LOW (ref 6.5–8.1)

## 2021-01-10 LAB — PROTIME-INR
INR: 0.8 (ref 0.8–1.2)
Prothrombin Time: 11.6 seconds (ref 11.4–15.2)

## 2021-01-10 LAB — URINE CULTURE: Culture: NO GROWTH

## 2021-01-10 LAB — MITOCHONDRIAL ANTIBODIES: Mitochondrial M2 Ab, IgG: <20 Units (ref 0.0–20.0)

## 2021-01-10 LAB — ANTI-SMOOTH MUSCLE ANTIBODY, IGG: F-Actin IgG: 8 Units (ref 0–19)

## 2021-01-10 LAB — ANA: Anti Nuclear Antibody (ANA): POSITIVE — AB

## 2021-01-10 MED ORDER — CEPHALEXIN 500 MG PO CAPS: 500.0000 mg | ORAL_CAPSULE | Freq: Three times a day (TID) | ORAL | Status: DC

## 2021-01-10 MED ORDER — ENSURE ENLIVE PO LIQD
237.0000 mL | Freq: Two times a day (BID) | ORAL | Status: DC
  Administered 2021-01-11: 237 mL via ORAL

## 2021-01-10 MED ORDER — CEPHALEXIN 500 MG PO CAPS
500.0000 mg | ORAL_CAPSULE | Freq: Two times a day (BID) | ORAL | Status: DC
  Administered 2021-01-10 – 2021-01-11 (×3): 500 mg via ORAL
  Filled 2021-01-10 (×3): qty 1

## 2021-01-10 NOTE — Progress Notes (Signed)
PROGRESS NOTE    Stephanie Wood  [id number] DOB: June 15, 1934 DOA: 01/08/2021 PCP: [name], MD    Brief Narrative:  86 y.o. female with a history of GERD, anxiety, IBS. Patient presents with fevers and abdominal discomfort that started about 4 days ago. She was diagnosed with a UTI by urology a day prior, was prescribed macrobid. She took two doses that day, then had a fever the next morning.  Fevers continued daily, improved with Tylenol.  No provoking factors.  She continued to take Macrobid was unable to take any of her other medications.  She was seen at the urgent care yesterday and given a dose of Rocephin.  As her symptoms continue to not improve, the patient presented here for evaluation  Assessment & Plan:   Principal Problem:   Elevated LFTs Active Problems:   GERD (gastroesophageal reflux disease)   Anxiety state   Irritable bowel syndrome (IBS)   Nausea   Melena    Elevated LFTs CT abd reviewed, no acute hepatobiliary process identified Hepatitis panel neg, autoimmune serologies pending Agree with holding further macrobid, see below Avoid hepatotoxic agents GI is following, appreciate recs LFT's slightly higher today but overall trending down RUQ [place] reviewed with pt, neg Per GI plan to repeat LFT's in AM UTI from 8/9 ruled in Recent UA reviewed. Found to have >30 WBC's 2+ leuks, mod bacteria, neg nitrites Was given 1 dose of rocephin, complete 2 more days of keflex Urine cx with no growth IBS Seems to be stable at this time Anxiety Affect appeared stable this AM Continue xanax as needed GERD Continue protonix    DVT prophylaxis: SCD's Code Status: Full Family Communication: Pt in room, family not at bedside  Status is: Inpatient  Remains inpatient appropriate because:Inpatient level of care appropriate due to severity of illness  Dispo: The patient is from: Home              Anticipated d/c is to: Home              Patient currently is not  medically stable to d/c.   Difficult to place patient No   Consultants:  GI  Procedures:    Antimicrobials: Anti-infectives (From admission, onward)    Start     Dose/Rate Route Frequency Ordered Stop   01/10/21 1400  cephALEXin (KEFLEX) capsule 500 mg  Status:  Discontinued        500 mg Oral Every 8 hours 01/10/21 1228 01/10/21 1230   01/10/21 1330  cephALEXin (KEFLEX) capsule 500 mg        500 mg Oral Every 12 hours 01/10/21 1230     01/09/21 1700  cefTRIAXone (ROCEPHIN) 2 g in sodium chloride 0.9 % 100 mL IVPB  Status:  Discontinued        2 g 200 mL/hr over 30 Minutes Intravenous Every 24 hours 01/08/21 2249 01/10/21 1228   01/08/21 2345  methenamine (MANDELAMINE) tablet 1 g        1 g Oral 2 times daily 01/08/21 2249     01/08/21 1715  cefTRIAXone (ROCEPHIN) 1 g in sodium chloride 0.9 % 100 mL IVPB        1 g 200 mL/hr over 30 Minutes Intravenous  Once 01/08/21 1700 01/08/21 1824       Subjective: Without complaints this AM  Objective: Vitals:   01/09/21 1357 01/09/21 1959 01/10/21 0635 01/10/21 1300  BP: (!) 111/53 118/60 125/62 (!) 141/64  Pulse: 85 88 83 87  Resp: '17 16 16 18  '$ Temp: 98.8 F (37.1 C) 99.1 F (37.3 C) 98.8 F (37.1 C) 99.5 F (37.5 C)  TempSrc:  Oral Oral Oral  SpO2: 93% 91% 90% 93%  Weight:      Height:        Intake/Output Summary (Last 24 hours) at 01/10/2021 1638 Last data filed at 01/10/2021 1300 Gross per 24 hour  Intake 720.09 ml  Output 700 ml  Net 20.09 ml    Filed Weights   01/08/21 1051 01/08/21 2250  Weight: 72.3 kg 71.9 kg    Examination: General exam: Conversant, in no acute distress Respiratory system: normal chest rise, clear, no audible wheezing Cardiovascular system: regular rhythm, s1-s2 Gastrointestinal system: Nondistended, nontender, pos BS Central nervous system: No seizures, no tremors Extremities: No cyanosis, no joint deformities Skin: No rashes, no pallor Psychiatry: Affect normal // no auditory  hallucinations   Data Reviewed: I have personally reviewed following labs and imaging studies  CBC: Recent Labs  Lab 01/08/21 1140 01/09/21 0446 01/10/21 1055  WBC 10.4 6.3 6.3  NEUTROABS 9.1*  --  4.3  HGB 13.5 12.0 11.5*  HCT 40.7 37.4 36.0  MCV 85.1 86.8 87.0  PLT 153 148* [id number]    Basic Metabolic Panel: Recent Labs  Lab 01/08/21 1140 01/09/21 0446 01/10/21 0732  NA 130* 136 138  K 3.6 3.5 3.5  CL 98 102 106  CO2 '22 26 27  '$ GLUCOSE 129* 92 108*  BUN '22 15 12  '$ CREATININE 0.91 0.78 0.82  CALCIUM 8.3* 8.0* 7.7*    GFR: Estimated Creatinine Clearance: 49.7 mL/min (by C-G formula based on SCr of 0.82 mg/dL). Liver Function Tests: Recent Labs  Lab 01/08/21 1140 01/09/21 0446 01/10/21 0732  AST 195* 115* 185*  ALT 251* 172* 175*  ALKPHOS 178* 180* 268*  BILITOT 3.3* 3.2* 2.5*  PROT 6.6 5.5* 5.4*  ALBUMIN 3.1* 2.6* 2.4*    Recent Labs  Lab 01/08/21 1140  LIPASE 38    No results for input(s): AMMONIA in the last 168 hours. Coagulation Profile: Recent Labs  Lab 01/09/21 0446 01/10/21 1055  INR 0.9 0.8    Cardiac Enzymes: No results for input(s): CKTOTAL, CKMB, CKMBINDEX, TROPONINI in the last 168 hours. BNP (last 3 results) No results for input(s): PROBNP in the last 8760 hours. HbA1C: No results for input(s): HGBA1C in the last 72 hours. CBG: No results for input(s): GLUCAP in the last 168 hours. Lipid Profile: No results for input(s): CHOL, HDL, LDLCALC, TRIG, CHOLHDL, LDLDIRECT in the last 72 hours. Thyroid Function Tests: No results for input(s): TSH, T4TOTAL, FREET4, T3FREE, THYROIDAB in the last 72 hours. Anemia Panel: Recent Labs    01/09/21 1339  TIBC 219*  IRON 18*   Sepsis Labs: No results for input(s): PROCALCITON, LATICACIDVEN in the last 168 hours.  Recent Results (from the past 240 hour(s))  Microscopic Examination     Status: Abnormal   Collection Time: 01/04/21  2:28 PM   Urine  Result Value Ref Range Status   WBC, UA  >30 (A) 0 - 5 /hpf Final   RBC 0-2 0 - 2 /hpf Final   Epithelial Cells (non renal) 0-10 0 - 10 /hpf Final   Renal Epithel, UA None seen None seen /hpf Final   Casts None seen None seen /lpf Final   Bacteria, UA Moderate (A) None seen/Few Final  Urine Culture     Status: Abnormal   Collection Time: 01/04/21  4:41 PM  Specimen: Urine, Clean Catch   UR  Result Value Ref Range Status   Urine Culture, Routine Final report (A)  Final   Organism ID, Bacteria Escherichia coli (A)  Final    Comment: Cefazolin <=4 ug/mL Cefazolin with an MIC <=16 predicts susceptibility to the oral agents cefaclor, cefdinir, cefpodoxime, cefprozil, cefuroxime, cephalexin, and loracarbef when used for therapy of uncomplicated urinary tract infections due to E. coli, Klebsiella pneumoniae, and Proteus mirabilis. 50,000-100,000 colony forming units per mL    Antimicrobial Susceptibility Comment  Final    Comment:       ** S = Susceptible; I = Intermediate; R = Resistant **                    P = Positive; N = Negative             MICS are expressed in micrograms per mL    Antibiotic                 RSLT#1    RSLT#2    RSLT#3    RSLT#4 Amoxicillin/Clavulanic Acid    S Ampicillin                     S Cefepime                       S Ceftriaxone                    S Cefuroxime                     S Ciprofloxacin                  S Ertapenem                      S Gentamicin                     S Imipenem                       S Levofloxacin                   S Meropenem                      S Nitrofurantoin                 S Piperacillin/Tazobactam        S Tetracycline                   S Tobramycin                     S Trimethoprim/Sulfa             S   Urine Culture     Status: None   Collection Time: 01/08/21 11:14 AM   Specimen: Urine, Clean Catch  Result Value Ref Range Status   Specimen Description   Final    URINE, CLEAN CATCH Performed at [facility], [address], [place], [place]  [address]    Special Requests   Final    NONE Performed at [facility], [address] [place], [place] [address]    Culture   Final    NO GROWTH Performed at [facility], [place] [address], [place], [place] [address]  Report Status 01/10/2021 FINAL  Final  Resp Panel by RT-PCR (Flu A&B, Covid) Nasopharyngeal Swab     Status: None   Collection Time: 01/08/21 11:15 AM   Specimen: Nasopharyngeal Swab; Nasopharyngeal(NP) swabs in vial transport medium  Result Value Ref Range Status   SARS Coronavirus 2 by RT PCR NEGATIVE NEGATIVE Final    Comment: (NOTE) SARS-CoV-2 target nucleic acids are NOT DETECTED.  The SARS-CoV-2 RNA is generally detectable in upper respiratory specimens during the acute phase of infection. The lowest concentration of SARS-CoV-2 viral copies this assay can detect is 138 copies/mL. A negative result does not preclude SARS-Cov-2 infection and should not be used as the sole basis for treatment or other patient management decisions. A negative result may occur with  improper specimen collection/handling, submission of specimen other than nasopharyngeal swab, presence of viral mutation(s) within the areas targeted by this assay, and inadequate number of viral copies(<138 copies/mL). A negative result must be combined with clinical observations, patient history, and epidemiological information. The expected result is Negative.  Fact Sheet for Patients:  EntrepreneurPulse.com.au  Fact Sheet for Healthcare Providers:  IncredibleEmployment.be  This test is no t yet approved or cleared by the [place] FDA and  has been authorized for detection and/or diagnosis of SARS-CoV-2 by FDA under an Emergency Use Authorization (EUA). This EUA will remain  in effect (meaning this test can be used) for the duration of the COVID-19 declaration under Section 564(b)(1) of the Act, 21 U.S.C.section 360bbb-3(b)(1), unless the  authorization is terminated  or revoked sooner.       Influenza A by PCR NEGATIVE NEGATIVE Final   Influenza B by PCR NEGATIVE NEGATIVE Final    Comment: (NOTE) The Xpert Xpress SARS-CoV-2/FLU/RSV plus assay is intended as an aid in the diagnosis of influenza from Nasopharyngeal swab specimens and should not be used as a sole basis for treatment. Nasal washings and aspirates are unacceptable for Xpert Xpress SARS-CoV-2/FLU/RSV testing.  Fact Sheet for Patients: EntrepreneurPulse.com.au  Fact Sheet for Healthcare Providers: IncredibleEmployment.be  This test is not yet approved or cleared by the [place] FDA and has been authorized for detection and/or diagnosis of SARS-CoV-2 by FDA under an Emergency Use Authorization (EUA). This EUA will remain in effect (meaning this test can be used) for the duration of the COVID-19 declaration under Section 564(b)(1) of the Act, 21 U.S.C. section 360bbb-3(b)(1), unless the authorization is terminated or revoked.  Performed at [facility], [address], [place], [place] [address]   Culture, blood (routine x 2)     Status: None (Preliminary result)   Collection Time: 01/09/21  1:39 PM   Specimen: BLOOD  Result Value Ref Range Status   Specimen Description BLOOD  Final   Special Requests NONE  Final   Culture   Final    NO GROWTH 1 DAY Performed at [facility], [address] [place], [place] [address]    Report Status PENDING  Incomplete  Culture, blood (routine x 2)     Status: None (Preliminary result)   Collection Time: 01/09/21  1:39 PM   Specimen: BLOOD  Result Value Ref Range Status   Specimen Description BLOOD  Final   Special Requests NONE  Final   Culture   Final    NO GROWTH 1 DAY Performed at [facility], [address], [place], [place] [address]    Report Status PENDING  Incomplete      Radiology Studies: US Abdomen Complete  Result Date: 01/09/2021 CLINICAL  DATA:   RIGHT upper quadrant pain EXAM: ABDOMEN ULTRASOUND COMPLETE COMPARISON:  CT 01/08/2021 FINDINGS: Gallbladder: Gallbladder is is collapsed. Gallbladder wall is thickened to 6 mm. No gallstones. No pericholecystic fluid. Negative sonographic Murphy's sign Common bile duct: Diameter: Upper limits of normal at 6 mm Liver: No focal lesion identified. Within normal limits in parenchymal echogenicity. Portal vein is patent on color Doppler imaging with normal direction of blood flow towards the liver. IVC: No abnormality visualized. Pancreas: Not visualized due to bowel gas. Spleen: Size and appearance within normal limits. Right Kidney: Length: 9.7 cm. Echogenicity within normal limits. No mass or hydronephrosis visualized. Left Kidney: Length: 10.8 cm.  Two benign appearing anechoic cysts. Abdominal aorta: No aneurysm visualized. Other findings: None. IMPRESSION: 1. No acute abdominal findings by ultrasound. 2. No gallstones. 3. Normal liver and biliary tree. 4. Benign appearing  LEFT renal cysts. Electronically Signed   By: Suzy Bouchard M.D.   On: 01/09/2021 12:31    Scheduled Meds:  atorvastatin  20 mg Oral QPM   cephALEXin  500 mg Oral Q12H   methenamine  1 g Oral BID   pantoprazole (PROTONIX) IV  40 mg Intravenous Q24H   vitamin B-12  1,000 mcg Oral Daily   Continuous Infusions:     LOS: 2 days   Marylu Lund, MD [facility] Pager On Amion  If 7PM-7AM, please contact night-coverage 01/10/2021, 4:38 PM

## 2021-01-10 NOTE — Progress Notes (Signed)
Initial Nutrition Assessment  DOCUMENTATION CODES:      INTERVENTION:  Ensure Enlive po BID, each supplement provides 350 kcal and 20 grams of protein   Heart Healthy diet  NUTRITION DIAGNOSIS:   Inadequate oral intake related to acute illness (UTI and abdominal pain) as evidenced by per patient/family report.   GOAL:  Patient will meet greater than or equal to 90% of their needs   MONITOR:  PO intake, Supplement acceptance, Labs, Weight trends  REASON FOR ASSESSMENT:   Malnutrition Screening Tool    ASSESSMENT: Patient is a 86 yo female with fever an abdominal pain on presentation. UTI diagnosis prior to admission and hx of GERD, Anemia, anxiety, depression, colonic adenoma and IBS.   Patient receiving a Heart Healthy diet. Patient ate ~ 50-75% of lunch today and liked the [place]. Feeds herself. Daughter bedside. Patient drinks Ensure at home when not eating well and likes to mix half vanilla/half chocolate together.   Patient weights ranging 68-72 kg > 2 years per hospital records. No significant wt change. Medications reviewed and include: lipitor, Vit B-12, Protonix and keflex.   Labs: BMP Latest Ref Rng & Units 01/10/2021 01/09/2021 01/08/2021  Glucose 70 - 99 mg/dL 108(H) 92 129(H)  BUN 8 - 23 mg/dL '12 15 22  '$ Creatinine 0.44 - 1.00 mg/dL 0.82 0.78 0.91  Sodium 135 - 145 mmol/L 138 136 130(L)  Potassium 3.5 - 5.1 mmol/L 3.5 3.5 3.6  Chloride 98 - 111 mmol/L 106 102 98  CO2 22 - 32 mmol/L '27 26 22  '$ Calcium 8.9 - 10.3 mg/dL 7.7(L) 8.0(L) 8.3(L)       NUTRITION - FOCUSED PHYSICAL EXAM: Unable to complete Nutrition-Focused physical exam at this time.     Diet Order:   Diet Order             Diet Heart Room service appropriate? Yes; Fluid consistency: Thin  Diet effective now                   EDUCATION NEEDS:  Education needs have been addressed  Skin:  Skin Assessment: Reviewed RN Assessment  Last BM:  8/14  Height:   Ht Readings from Last 1  Encounters:  01/08/21 '5\' 4"'$  (1.626 m)    Weight:   Wt Readings from Last 1 Encounters:  01/08/21 71.9 kg    Ideal Body Weight:   55 kg  BMI:  Body mass index is 27.21 kg/m.  Estimated Nutritional Needs:   Kcal:  1500-1600  Protein:  72-77 gr  Fluid:  >1500 ml daily  Colman Cater MS,RD,CSG,LDN Contact: Shea Evans

## 2021-01-10 NOTE — Progress Notes (Signed)
  Subjective: No abdominal pain, N/V. No mental status changes or confusion. Tolerating heart healthy diet.   Objective: Vital signs in last 24 hours: Temp:  [98.6 F (37 C)-99.1 F (37.3 C)] 98.8 F (37.1 C) (08/15 0635) Pulse Rate:  [83-88] 83 (08/15 0635) Resp:  [16-17] 16 (08/15 0635) BP: (108-125)/(53-62) 125/62 (08/15 0635) SpO2:  [90 %-93 %] 90 % (08/15 0635) Last BM Date: 01/09/21 General:   Alert and oriented, pleasant Head:  Normocephalic and atraumatic. Eyes:  No icterus, sclera clear. Conjuctiva pink.  Abdomen:  Bowel sounds present, soft, non-tender, non-distended. No HSM or hernias noted. No rebound or guarding. No masses appreciated  Neurologic:  Alert and  oriented x4  Intake/Output from previous day: 08/14 0701 - 08/15 0700 In: 1519.6 [P.O.:860; I.V.:559.5; IV Piggyback:100.1] Out: 1050 [Urine:1050] Intake/Output this shift: No intake/output data recorded.  Lab Results: Recent Labs    01/08/21 1140 01/09/21 0446  WBC 10.4 6.3  HGB 13.5 12.0  HCT 40.7 37.4  PLT 153 148*   BMET Recent Labs    01/08/21 1140 01/09/21 0446 01/10/21 0732  NA 130* 136 138  K 3.6 3.5 3.5  CL 98 102 106  CO2 22 26 27  GLUCOSE 129* 92 108*  BUN 22 15 12  CREATININE 0.91 0.78 0.82  CALCIUM 8.3* 8.0* 7.7*   LFT Recent Labs    01/08/21 1140 01/09/21 0446 01/10/21 0732  PROT 6.6 5.5* 5.4*  ALBUMIN 3.1* 2.6* 2.4*  AST 195* 115* 185*  ALT 251* 172* 175*  ALKPHOS 178* 180* 268*  BILITOT 3.3* 3.2* 2.5*   PT/INR Recent Labs    01/09/21 0446  LABPROT 12.1  INR 0.9   Hepatitis Panel Recent Labs    01/09/21 1339  HEPBSAG NON REACTIVE  HCVAB NON REACTIVE  HEPAIGM NON REACTIVE  HEPBIGM NON REACTIVE    Studies/Results: US Abdomen Complete  Result Date: 01/09/2021 CLINICAL DATA:  RIGHT upper quadrant pain EXAM: ABDOMEN ULTRASOUND COMPLETE COMPARISON:  CT 01/08/2021 FINDINGS: Gallbladder: Gallbladder is is collapsed. Gallbladder wall is thickened to 6 mm.  No gallstones. No pericholecystic fluid. Negative sonographic Murphy's sign Common bile duct: Diameter: Upper limits of normal at 6 mm Liver: No focal lesion identified. Within normal limits in parenchymal echogenicity. Portal vein is patent on color Doppler imaging with normal direction of blood flow towards the liver. IVC: No abnormality visualized. Pancreas: Not visualized due to bowel gas. Spleen: Size and appearance within normal limits. Right Kidney: Length: 9.7 cm. Echogenicity within normal limits. No mass or hydronephrosis visualized. Left Kidney: Length: 10.8 cm.  Two benign appearing anechoic cysts. Abdominal aorta: No aneurysm visualized. Other findings: None. IMPRESSION: 1. No acute abdominal findings by ultrasound. 2. No gallstones. 3. Normal liver and biliary tree. 4. Benign appearing  LEFT renal cysts. Electronically Signed   By: Stewart  Edmunds M.D.   On: 01/09/2021 12:31   CT Abdomen Pelvis W Contrast  Result Date: 01/08/2021 CLINICAL DATA:  Abdominal abscess/infection suspected. Urinary tract infection. Epigastric pain EXAM: CT ABDOMEN AND PELVIS WITH CONTRAST TECHNIQUE: Multidetector CT imaging of the abdomen and pelvis was performed using the standard protocol following bolus administration of intravenous contrast. CONTRAST:  85mL OMNIPAQUE IOHEXOL 350 MG/ML SOLN COMPARISON:  12/28/2016, 12/20/2018 FINDINGS: Lower chest: Bibasilar atelectasis. Heart size is within normal limits. Incompletely visualized large hiatal hernia containing the majority of the stomach. Hepatobiliary: No focal liver abnormality is seen. No gallstones, gallbladder wall thickening, or biliary dilatation. Pancreas: Unremarkable. No pancreatic ductal dilatation or surrounding   inflammatory changes. Spleen: Normal in size without focal abnormality. Adrenals/Urinary Tract: Unremarkable adrenal glands. Two left renal cysts, largest measuring up to 2.2 cm. Subcentimeter low-density lesions within the right kidney which are  too small to definitively characterize, but most likely represent cysts. Kidneys have otherwise symmetric enhancement. No renal Musleh or hydronephrosis. Mildly distended bladder with a single bubble of air within the bladder lumen. No appreciable wall thickening. Stomach/Bowel: Stomach is predominantly intrathoracic. Small bowel appears within normal limits. Appendix appears normal (series 2, image 47). No evidence of bowel wall thickening, distention, or inflammatory changes. Vascular/Lymphatic: Scattered aortoiliac atherosclerotic calcifications without aneurysm. No abdominopelvic lymphadenopathy. Reproductive: Uterus and bilateral adnexa are unremarkable. Other: No free fluid. No abdominopelvic fluid collection. No pneumoperitoneum. Small fat containing right inguinal hernia. Musculoskeletal: Severe compression fracture of the L1 vertebral body, which appears chronic although has progressed from previous radiographs of 12/20/2018. No additional significant bony findings. IMPRESSION: 1. No acute abdominopelvic findings. 2. Single bubble of air within the bladder lumen. Correlate for recent instrumentation. 3. Severe compression fracture of the L1 vertebral body, which appears chronic although has progressed from previous radiographs of 12/20/2018. 4. Incompletely visualized large hiatal hernia containing the majority of the stomach. Aortic Atherosclerosis (ICD10-I70.0). Electronically Signed   By: Davina Poke D.O.   On: 01/08/2021 16:23    Assessment: Pleasant 86 year old female presenting with fever and abdominal discomfort, recently with exposure to [facility] for UTI and noted to have acutely elevated LFTs felt to be most likely related to drug-induced hepatitis. Reports of one episode of black tarry stool at time of consultation.   Elevated LFTs: US abdomen without gallstones. CBD upper limits of normal at 6 mm. She is without any pain and tolerating diet.  Continue to hold Marcrobid. Fluctuating today  with AST increased from yesterday but still lower than initial presentation;  ALT approximately same from yesterday and improved from admission. Alk Phos increased today to 268, initially 178 on admission. Bilirubin declining. Acute hepatitis panel negative. Further serologies pending. Will update INR today. Recommend following another 24 hours to ensure improving prior to discharge, with close follow-up as outpatient.   Black stool: none today. Hgb 12.0 yesterday. Update CBC today.   Plan: Continue to trend LFTs Update INR and CBC today Will follow-up on pending serologies Monitor for mental status changes Will reassess in am   Annitta Needs, PhD, ANP-BC [facility]    LOS: 2 days    01/10/2021, 10:21 AM

## 2021-01-11 DIAGNOSIS — D61818 Other pancytopenia: Secondary | ICD-10-CM

## 2021-01-11 DIAGNOSIS — D649 Anemia, unspecified: Secondary | ICD-10-CM

## 2021-01-11 DIAGNOSIS — E611 Iron deficiency: Secondary | ICD-10-CM

## 2021-01-11 LAB — CBC WITH DIFFERENTIAL/PLATELET
Abs Immature Granulocytes: 0.07 10*3/uL (ref 0.00–0.07)
Basophils Absolute: 0 10*3/uL (ref 0.0–0.1)
Basophils Relative: 1 %
Eosinophils Absolute: 0.1 10*3/uL (ref 0.0–0.5)
Eosinophils Relative: 2 %
HCT: 37.1 % (ref 36.0–46.0)
Hemoglobin: 12 g/dL (ref 12.0–15.0)
Immature Granulocytes: 1 %
Lymphocytes Relative: 20 %
Lymphs Abs: 1.5 10*3/uL (ref 0.7–4.0)
MCH: 28.2 pg (ref 26.0–34.0)
MCHC: 32.3 g/dL (ref 30.0–36.0)
MCV: 87.3 fL (ref 80.0–100.0)
Monocytes Absolute: 0.6 10*3/uL (ref 0.1–1.0)
Monocytes Relative: 7 %
Neutro Abs: 5.2 10*3/uL (ref 1.7–7.7)
Neutrophils Relative %: 69 %
Platelets: 186 10*3/uL (ref 150–400)
RBC: 4.25 MIL/uL (ref 3.87–5.11)
RDW: 15.5 % (ref 11.5–15.5)
WBC: 7.5 10*3/uL (ref 4.0–10.5)
nRBC: 0 % (ref 0.0–0.2)

## 2021-01-11 LAB — COMPREHENSIVE METABOLIC PANEL
ALT: 171 U/L — ABNORMAL HIGH (ref 0–44)
AST: 155 U/L — ABNORMAL HIGH (ref 15–41)
Albumin: 2.5 g/dL — ABNORMAL LOW (ref 3.5–5.0)
Alkaline Phosphatase: 301 U/L — ABNORMAL HIGH (ref 38–126)
Anion gap: 7 (ref 5–15)
BUN: 10 mg/dL (ref 8–23)
CO2: 26 mmol/L (ref 22–32)
Calcium: 8 mg/dL — ABNORMAL LOW (ref 8.9–10.3)
Chloride: 105 mmol/L (ref 98–111)
Creatinine, Ser: 0.7 mg/dL (ref 0.44–1.00)
GFR, Estimated: >60 mL/min (ref >60)
Glucose, Bld: 118 mg/dL — ABNORMAL HIGH (ref 70–99)
Potassium: 3.6 mmol/L (ref 3.5–5.1)
Sodium: 138 mmol/L (ref 135–145)
Total Bilirubin: 1.8 mg/dL — ABNORMAL HIGH (ref 0.3–1.2)
Total Protein: 5.3 g/dL — ABNORMAL LOW (ref 6.5–8.1)

## 2021-01-11 LAB — FERRITIN: Ferritin: 132 ng/mL (ref 11–307)

## 2021-01-11 MED ORDER — POTASSIUM CHLORIDE CRYS ER 20 MEQ PO TBCR
40.0000 meq | EXTENDED_RELEASE_TABLET | Freq: Once | ORAL | Status: AC
  Administered 2021-01-11: 40 meq via ORAL
  Filled 2021-01-11: qty 2

## 2021-01-11 MED ORDER — CEPHALEXIN 500 MG PO CAPS: 500.0000 mg | ORAL_CAPSULE | Freq: Two times a day (BID) | ORAL | 0 refills | Status: AC

## 2021-01-11 NOTE — Progress Notes (Signed)
Subjective: Denies abdominal pain, nausea, vomiting, mental status changes, confusion.  Tolerating her diet well.  She had 2 bowel movements this morning without BRBPR or melena.  No diarrhea.  Objective: Vital signs in last 24 hours: Temp:  [98.2 F (36.8 C)-99.5 F (37.5 C)] 98.2 F (36.8 C) (08/16 0332) Pulse Rate:  [75-99] 75 (08/16 0332) Resp:  [18-19] 19 (08/16 0332) BP: (124-141)/(58-64) 141/59 (08/16 0332) SpO2:  [92 %-93 %] 92 % (08/16 0332) Last BM Date: 01/09/21 General:   Alert and oriented, pleasant, no acute distress. Head:  Normocephalic and atraumatic. Eyes:  No icterus, sclera clear. Conjuctiva pink.  Abdomen:  Bowel sounds present, soft, non-tender, non-distended. No HSM or hernias noted. No rebound or guarding. No masses appreciated  Msk:  Symmetrical without gross deformities. Normal posture. Extremities:  Without edema. Neurologic:  Alert and  oriented x4;  grossly normal neurologically. Psych: Normal mood and affect.  Intake/Output from previous day: 08/15 0701 - 08/16 0700 In: 240 [P.O.:240] Out: 700 [Urine:700] Intake/Output this shift: Total I/O In: 240 [P.O.:240] Out: -   Lab Results: Recent Labs    01/09/21 0446 01/10/21 1055  WBC 6.3 6.3  HGB 12.0 11.5*  HCT 37.4 36.0  PLT 148* 155   BMET Recent Labs    01/09/21 0446 01/10/21 0732 01/11/21 0649  NA 136 138 138  K 3.5 3.5 3.6  CL 102 106 105  CO2 $Re'26 27 26  'Abf$ GLUCOSE 92 108* 118*  BUN $Re'15 12 10  'Cby$ CREATININE 0.78 0.82 0.70  CALCIUM 8.0* 7.7* 8.0*   LFT Recent Labs    01/09/21 0446 01/10/21 0732 01/11/21 0649  PROT 5.5* 5.4* 5.3*  ALBUMIN 2.6* 2.4* 2.5*  AST 115* 185* 155*  ALT 172* 175* 171*  ALKPHOS 180* 268* 301*  BILITOT 3.2* 2.5* 1.8*   PT/INR Recent Labs    01/09/21 0446 01/10/21 1055  LABPROT 12.1 11.6  INR 0.9 0.8   Hepatitis Panel Recent Labs    01/09/21 1339  HEPBSAG NON REACTIVE  HCVAB NON REACTIVE  HEPAIGM NON REACTIVE  HEPBIGM NON REACTIVE     Assessment: 86 year old female presenting 8/13 with fever and abdominal discomfort, recently with exposure to [place] for UTI and noted to have acutely elevated LFTs felt to be most likely related to drug-induced hepatitis. Reports of one episode of black tarry stool in the ED.   Elevated LFTs: Suspect drug induced liver injury. Thought to be secondary to Macrobid initially which is on hold.  Upon further discussion today, patient's daughter tells me she was taking 1000 mg of Tylenol every 6 hours between Wednesday-Friday due to persistent fever.  This may very well have contributed to drug-induced liver injury. US abdomen without gallstones. CBD upper limits of normal at 6 mm. She is without any pain and tolerating diet.  Multiple serologies have been ordered.  Acute hepatitis panel negative.  ANA positive (nonspecific), ASMA negative, AMA negative, immunoglobulins in process.  Iron panel is low, no ferritin ordered.  AST/ALT improved slightly today at 155 and 171 respectively.  Alk phos increased to 301 today from 268 yesterday.  Overall, AST and ALT are lower than on admission, but alk phos is higher than on admission (178).  T bili improved to 1.8 from 2.5 yesterday, 3.3 on admission.  Recommend continuing supportive measures at this time and avoiding hepatotoxic medications.  Recommend holding Lipitor for now.  Anemia, iron deficiency, black stool: History of iron deficiency anemia.  Prior evaluation in 2013 in 2015  with EGD and colonoscopy. EGD in 2013 with mild reflux esophagitis, moderate hiatal hernia with focal gastritis, single erosion below the level of hiatus and antral scar. Colonoscopy in 2015 with external hemorrhoids, anal papilla, single small diverticulum at sigmoid colon. Patient reported 1 black stool in the emergency room which was new for her.  She does take iron twice a week, but has not taken any recently.  No further melena or other overt bleeding.  She is chronically on  Pepcid BID, currently on PPI daily inpatient. Denies routine NSAID use. Hemoglobin 12 on admission, down to 11.5 yesterday.  Noted iron deficiency with iron 18, saturation 8%.  We will update CBC today and check ferritin.  She may need further evaluation outpatient. No need for procedures at this time.  Would prefer for acute liver injury to improve prior to procedures.  Plan: Continue to trend LFTs. Update CBC and check ferritin today. Recommend holding Lipitor for now until LFTs improve. Avoid hepatotoxic medications.  Follow-up on pending serologies. Monitor for mental status changes. Continue PPI daily.  Could consider outpatient endoscopic evaluation of IDA.  Would not recommend inpatient evaluation unless she has recurrent melena or develops transfusion dependent anemia. Continue supportive measures.  Possible discharge in the next 24 hours.   LOS: 3 days    01/11/2021, 12:20 PM   Aliene Altes, [facility]

## 2021-01-11 NOTE — Progress Notes (Signed)
Nsg Discharge Note  Admit Date:  01/08/2021 Discharge date: 01/11/2021   [name] to be D/C'd Home per MD order.  AVS completed.  Copy for chart, and copy for patient signed, and dated. Patient/caregiver able to verbalize understanding.  Discharge Medication: Allergies as of 01/11/2021       Reactions   Drug Class [trazodone And Nefazodone]    Macrobid [nitrofurantoin] Other (See Comments)   Elevated LFT's   No Known Allergies         Medication List     STOP taking these medications    atorvastatin 20 MG tablet Commonly known as: LIPITOR   nitrofurantoin (macrocrystal-monohydrate) 100 MG capsule Commonly known as: Macrobid       TAKE these medications    ALPRAZolam 0.5 MG tablet Commonly known as: XANAX Take 0.5 mg by mouth at bedtime as needed for sleep or anxiety. Sleep   bifidobacterium infantis capsule Take 1 capsule by mouth daily.   CALCIUM 600 PO Take 1 capsule by mouth daily.   cephALEXin 500 MG capsule Commonly known as: KEFLEX Take 1 capsule (500 mg total) by mouth every 12 (twelve) hours for 1 day. Start taking on: January 12, 2021   ELDERBERRY PO Take 1 capsule by mouth daily.   famotidine 20 MG tablet Commonly known as: PEPCID Take 20 mg by mouth 2 (two) times daily.   Flintstones Plus Iron chewable tablet Chew 1 tablet by mouth 2 (two) times a week.   FLUTICASONE PROPIONATE (NASAL) NA Place 1 spray into the nose daily as needed (rhinitis).   ipratropium 0.06 % nasal spray Commonly known as: ATROVENT Place 2 sprays into both nostrils 2 (two) times daily as needed for rhinitis.   loperamide 2 MG capsule Commonly known as: IMODIUM Take 2 mg by mouth daily as needed for diarrhea or loose stools.   loratadine 10 MG tablet Commonly known as: CLARITIN Take 10 mg by mouth daily.   methenamine 1 g tablet Commonly known as: HIPREX Take 1 g by mouth 2 (two) times daily.   ondansetron 4 MG tablet Commonly known as: ZOFRAN Take 1  tablet (4 mg total) by mouth every 6 (six) hours. What changed:  when to take this reasons to take this   Prolia 60 MG/ML Sosy injection Generic drug: denosumab SMARTSIG:SUB-Q Twice a Year   vitamin B-12 1000 MCG tablet Commonly known as: CYANOCOBALAMIN Take 1,000 mcg by mouth daily.   Vitamin D (Ergocalciferol) 1.25 MG (50000 UNIT) Caps capsule Commonly known as: DRISDOL Take 50,000 Units by mouth every 7 (seven) days. Takes on Saturdays.        Discharge Assessment: Vitals:   01/11/21 0332 01/11/21 1255  BP: (!) 141/59 135/72  Pulse: 75 79  Resp: 19 18  Temp: 98.2 F (36.8 C) 98.3 F (36.8 C)  SpO2: 92% 97%   Skin clean, dry and intact without evidence of skin break down, no evidence of skin tears noted. IV catheter discontinued intact. Site without signs and symptoms of complications - no redness or edema noted at insertion site, patient denies c/o pain - only slight tenderness at site.  Dressing with slight pressure applied.  D/c Instructions-Education: Discharge instructions given to patient/family with verbalized understanding. D/c education completed with patient/family including follow up instructions, medication list, d/c activities limitations if indicated, with other d/c instructions as indicated by MD - patient able to verbalize understanding, all questions fully answered. Patient instructed to return to ED, call 911, or call MD for any  changes in condition.  Patient escorted via [place], and D/C home via private auto.  Dorcas Mcmurray, LPN [phone] PM

## 2021-01-11 NOTE — Discharge Summary (Signed)
Physician Discharge Summary  Stephanie Wood [id number] DOB: 1935/01/19 DOA: 01/08/2021  PCP: [name], MD  Admit date: 01/08/2021 Discharge date: 01/11/2021  Admitted From: Home Disposition:  Home  Recommendations for Outpatient Follow-up:  Follow up with PCP in 1-2 weeks Please repeat CMP in 2 weeks, focus on LFT's  Discharge Condition:Stable CODE STATUS:Full Diet recommendation: Regular   Brief/Interim Summary: 86 y.o. female with a history of GERD, anxiety, IBS. Patient presents with fevers and abdominal discomfort that started about 4 days ago. She was diagnosed with a UTI by urology a day prior, was prescribed macrobid. She took two doses that day, then had a fever the next morning.  Fevers continued daily, improved with Tylenol.  No provoking factors.  She continued to take Macrobid was unable to take any of her other medications.  She was seen at the urgent care yesterday and given a dose of Rocephin.  As her symptoms continue to not improve, the patient presented here for evaluation  Discharge Diagnoses:  Principal Problem:   Elevated LFTs Active Problems:   GERD (gastroesophageal reflux disease)   Anxiety state   Irritable bowel syndrome (IBS)   Nausea   Melena   Low iron   Anemia    Elevated LFTs CT abd reviewed, no acute hepatobiliary process identified Hepatitis panel neg, autoimmune serologies pending Agree with holding further macrobid, see below Avoid hepatotoxic agents GI is following, appreciate recs LFT's now trending down RUQ [place] reviewed with pt, neg Discussed with GI. OK to d/c today with recommendation for repeat LFT's in 2 weeks to ensure normalization UTI from 8/9 ruled in Recent UA reviewed. Found to have >30 WBC's 2+ leuks, mod bacteria, neg nitrites Was given 1 dose of rocephin, complete 2 more total days of keflex Urine cx with no growth IBS Seems to be stable at this time Anxiety Affect appeared stable Continue xanax as  needed GERD Continue protonix    Discharge Instructions   Allergies as of 01/11/2021       Reactions   Drug Class [trazodone And Nefazodone]    Macrobid [nitrofurantoin] Other (See Comments)   Elevated LFT's   No Known Allergies         Medication List     STOP taking these medications    atorvastatin 20 MG tablet Commonly known as: LIPITOR   nitrofurantoin (macrocrystal-monohydrate) 100 MG capsule Commonly known as: Macrobid       TAKE these medications    ALPRAZolam 0.5 MG tablet Commonly known as: XANAX Take 0.5 mg by mouth at bedtime as needed for sleep or anxiety. Sleep   bifidobacterium infantis capsule Take 1 capsule by mouth daily.   CALCIUM 600 PO Take 1 capsule by mouth daily.   cephALEXin 500 MG capsule Commonly known as: KEFLEX Take 1 capsule (500 mg total) by mouth every 12 (twelve) hours for 1 day. Start taking on: January 12, 2021   ELDERBERRY PO Take 1 capsule by mouth daily.   famotidine 20 MG tablet Commonly known as: PEPCID Take 20 mg by mouth 2 (two) times daily.   Flintstones Plus Iron chewable tablet Chew 1 tablet by mouth 2 (two) times a week.   FLUTICASONE PROPIONATE (NASAL) NA Place 1 spray into the nose daily as needed (rhinitis).   ipratropium 0.06 % nasal spray Commonly known as: ATROVENT Place 2 sprays into both nostrils 2 (two) times daily as needed for rhinitis.   loperamide 2 MG capsule Commonly known as: IMODIUM Take 2 mg by  mouth daily as needed for diarrhea or loose stools.   loratadine 10 MG tablet Commonly known as: CLARITIN Take 10 mg by mouth daily.   methenamine 1 g tablet Commonly known as: HIPREX Take 1 g by mouth 2 (two) times daily.   ondansetron 4 MG tablet Commonly known as: ZOFRAN Take 1 tablet (4 mg total) by mouth every 6 (six) hours. What changed:  when to take this reasons to take this   Prolia 60 MG/ML Sosy injection Generic drug: denosumab SMARTSIG:SUB-Q Twice a Year   vitamin  B-12 1000 MCG tablet Commonly known as: CYANOCOBALAMIN Take 1,000 mcg by mouth daily.   Vitamin D (Ergocalciferol) 1.25 MG (50000 UNIT) Caps capsule Commonly known as: DRISDOL Take 50,000 Units by mouth every 7 (seven) days. Takes on Saturdays.        Follow-up Information     [name], MD Follow up in 2 week(s).   Specialty: Family Medicine Why: Hospital follow up Contact information: [place] [address] [phone]                Allergies  Allergen Reactions   Drug Class [Trazodone And Nefazodone]    Macrobid [Nitrofurantoin] Other (See Comments)    Elevated LFT's   No Known Allergies     Consultations: GI  Procedures/Studies: US Abdomen Complete  Result Date: 01/09/2021 CLINICAL DATA:  RIGHT upper quadrant pain EXAM: ABDOMEN ULTRASOUND COMPLETE COMPARISON:  CT 01/08/2021 FINDINGS: Gallbladder: Gallbladder is is collapsed. Gallbladder wall is thickened to 6 mm. No gallstones. No pericholecystic fluid. Negative sonographic Murphy's sign Common bile duct: Diameter: Upper limits of normal at 6 mm Liver: No focal lesion identified. Within normal limits in parenchymal echogenicity. Portal vein is patent on color Doppler imaging with normal direction of blood flow towards the liver. IVC: No abnormality visualized. Pancreas: Not visualized due to bowel gas. Spleen: Size and appearance within normal limits. Right Kidney: Length: 9.7 cm. Echogenicity within normal limits. No mass or hydronephrosis visualized. Left Kidney: Length: 10.8 cm.  Two benign appearing anechoic cysts. Abdominal aorta: No aneurysm visualized. Other findings: None. IMPRESSION: 1. No acute abdominal findings by ultrasound. 2. No gallstones. 3. Normal liver and biliary tree. 4. Benign appearing  LEFT renal cysts. Electronically Signed   By: Suzy Bouchard M.D.   On: 01/09/2021 12:31   CT Abdomen Pelvis W Contrast  Result Date: 01/08/2021 CLINICAL DATA:  Abdominal  abscess/infection suspected. Urinary tract infection. Epigastric pain EXAM: CT ABDOMEN AND PELVIS WITH CONTRAST TECHNIQUE: Multidetector CT imaging of the abdomen and pelvis was performed using the standard protocol following bolus administration of intravenous contrast. CONTRAST:  45m OMNIPAQUE IOHEXOL 350 MG/ML SOLN COMPARISON:  12/28/2016, 12/20/2018 FINDINGS: Lower chest: Bibasilar atelectasis. Heart size is within normal limits. Incompletely visualized large hiatal hernia containing the majority of the stomach. Hepatobiliary: No focal liver abnormality is seen. No gallstones, gallbladder wall thickening, or biliary dilatation. Pancreas: Unremarkable. No pancreatic ductal dilatation or surrounding inflammatory changes. Spleen: Normal in size without focal abnormality. Adrenals/Urinary Tract: Unremarkable adrenal glands. Two left renal cysts, largest measuring up to 2.2 cm. Subcentimeter low-density lesions within the right kidney which are too small to definitively characterize, but most likely represent cysts. Kidneys have otherwise symmetric enhancement. No renal [place] or hydronephrosis. Mildly distended bladder with a single bubble of air within the bladder lumen. No appreciable wall thickening. Stomach/Bowel: Stomach is predominantly intrathoracic. Small bowel appears within normal limits. Appendix appears normal (series 2, image 47). No evidence of bowel  wall thickening, distention, or inflammatory changes. Vascular/Lymphatic: Scattered aortoiliac atherosclerotic calcifications without aneurysm. No abdominopelvic lymphadenopathy. Reproductive: Uterus and bilateral adnexa are unremarkable. Other: No free fluid. No abdominopelvic fluid collection. No pneumoperitoneum. Small fat containing right inguinal hernia. Musculoskeletal: Severe compression fracture of the L1 vertebral body, which appears chronic although has progressed from previous radiographs of 12/20/2018. No additional significant bony findings.  IMPRESSION: 1. No acute abdominopelvic findings. 2. Single bubble of air within the bladder lumen. Correlate for recent instrumentation. 3. Severe compression fracture of the L1 vertebral body, which appears chronic although has progressed from previous radiographs of 12/20/2018. 4. Incompletely visualized large hiatal hernia containing the majority of the stomach. Aortic Atherosclerosis (ICD10-I70.0). Electronically Signed   By: Davina Poke D.O.   On: 01/08/2021 16:23    Subjective: Eager to go home  Discharge Exam: Vitals:   01/11/21 0332 01/11/21 1255  BP: (!) 141/59 135/72  Pulse: 75 79  Resp: 19 18  Temp: 98.2 F (36.8 C) 98.3 F (36.8 C)  SpO2: 92% 97%   Vitals:   01/10/21 1300 01/10/21 2037 01/11/21 0332 01/11/21 1255  BP: (!) 141/64 (!) 124/58 (!) 141/59 135/72  Pulse: 87 99 75 79  Resp: '18 19 19 18  '$ Temp: 99.5 F (37.5 C) 98.6 F (37 C) 98.2 F (36.8 C) 98.3 F (36.8 C)  TempSrc: Oral Oral  Oral  SpO2: 93% 93% 92% 97%  Weight:      Height:        General: Pt is alert, awake, not in acute distress Cardiovascular: RRR, S1/S2 + Respiratory: CTA bilaterally, no wheezing, no rhonchi Abdominal: Soft, NT, ND, bowel sounds + Extremities: no edema, no cyanosis   The results of significant diagnostics from this hospitalization (including imaging, microbiology, ancillary and laboratory) are listed below for reference.     Microbiology: Recent Results (from the past 240 hour(s))  Microscopic Examination     Status: Abnormal   Collection Time: 01/04/21  2:28 PM   Urine  Result Value Ref Range Status   WBC, UA >30 (A) 0 - 5 /hpf Final   RBC 0-2 0 - 2 /hpf Final   Epithelial Cells (non renal) 0-10 0 - 10 /hpf Final   Renal Epithel, UA None seen None seen /hpf Final   Casts None seen None seen /lpf Final   Bacteria, UA Moderate (A) None seen/Few Final  Urine Culture     Status: Abnormal   Collection Time: 01/04/21  4:41 PM   Specimen: Urine, Clean Catch   UR   Result Value Ref Range Status   Urine Culture, Routine Final report (A)  Final   Organism ID, Bacteria Escherichia coli (A)  Final    Comment: Cefazolin <=4 ug/mL Cefazolin with an MIC <=16 predicts susceptibility to the oral agents cefaclor, cefdinir, cefpodoxime, cefprozil, cefuroxime, cephalexin, and loracarbef when used for therapy of uncomplicated urinary tract infections due to E. coli, Klebsiella pneumoniae, and Proteus mirabilis. 50,000-100,000 colony forming units per mL    Antimicrobial Susceptibility Comment  Final    Comment:       ** S = Susceptible; I = Intermediate; R = Resistant **                    P = Positive; N = Negative             MICS are expressed in micrograms per mL    Antibiotic  RSLT#1    RSLT#2    RSLT#3    RSLT#4 Amoxicillin/Clavulanic Acid    S Ampicillin                     S Cefepime                       S Ceftriaxone                    S Cefuroxime                     S Ciprofloxacin                  S Ertapenem                      S Gentamicin                     S Imipenem                       S Levofloxacin                   S Meropenem                      S Nitrofurantoin                 S Piperacillin/Tazobactam        S Tetracycline                   S Tobramycin                     S Trimethoprim/Sulfa             S   Urine Culture     Status: None   Collection Time: 01/08/21 11:14 AM   Specimen: Urine, Clean Catch  Result Value Ref Range Status   Specimen Description   Final    URINE, CLEAN CATCH Performed at [facility], [address], [place], [place] [address]    Special Requests   Final    NONE Performed at [facility], [address] [place], [place] [address]    Culture   Final    NO GROWTH Performed at [facility], [place] [address] [place], [place] [address]    Report Status 01/10/2021 FINAL  Final  Resp Panel by RT-PCR (Flu A&B, Covid) Nasopharyngeal Swab     Status: None   Collection  Time: 01/08/21 11:15 AM   Specimen: Nasopharyngeal Swab; Nasopharyngeal(NP) swabs in vial transport medium  Result Value Ref Range Status   SARS Coronavirus 2 by RT PCR NEGATIVE NEGATIVE Final    Comment: (NOTE) SARS-CoV-2 target nucleic acids are NOT DETECTED.  The SARS-CoV-2 RNA is generally detectable in upper respiratory specimens during the acute phase of infection. The lowest concentration of SARS-CoV-2 viral copies this assay can detect is 138 copies/mL. A negative result does not preclude SARS-Cov-2 infection and should not be used as the sole basis for treatment or other patient management decisions. A negative result may occur with  improper specimen collection/handling, submission of specimen other than nasopharyngeal swab, presence of viral mutation(s) within the areas targeted by this assay, and inadequate number of viral copies(<138 copies/mL). A negative result must be combined with clinical observations, patient history, and epidemiological information.  The expected result is Negative.  Fact Sheet for Patients:  EntrepreneurPulse.com.au  Fact Sheet for Healthcare Providers:  IncredibleEmployment.be  This test is no t yet approved or cleared by the [place] FDA and  has been authorized for detection and/or diagnosis of SARS-CoV-2 by FDA under an Emergency Use Authorization (EUA). This EUA will remain  in effect (meaning this test can be used) for the duration of the COVID-19 declaration under Section 564(b)(1) of the Act, 21 U.S.C.section 360bbb-3(b)(1), unless the authorization is terminated  or revoked sooner.       Influenza A by PCR NEGATIVE NEGATIVE Final   Influenza B by PCR NEGATIVE NEGATIVE Final    Comment: (NOTE) The Xpert Xpress SARS-CoV-2/FLU/RSV plus assay is intended as an aid in the diagnosis of influenza from Nasopharyngeal swab specimens and should not be used as a sole basis for treatment. Nasal washings  and aspirates are unacceptable for Xpert Xpress SARS-CoV-2/FLU/RSV testing.  Fact Sheet for Patients: EntrepreneurPulse.com.au  Fact Sheet for Healthcare Providers: IncredibleEmployment.be  This test is not yet approved or cleared by the [place] FDA and has been authorized for detection and/or diagnosis of SARS-CoV-2 by FDA under an Emergency Use Authorization (EUA). This EUA will remain in effect (meaning this test can be used) for the duration of the COVID-19 declaration under Section 564(b)(1) of the Act, 21 U.S.C. section 360bbb-3(b)(1), unless the authorization is terminated or revoked.  Performed at [facility], [address], [place], [place] [address]   Culture, blood (routine x 2)     Status: None (Preliminary result)   Collection Time: 01/09/21  1:39 PM   Specimen: BLOOD  Result Value Ref Range Status   Specimen Description BLOOD  Final   Special Requests NONE  Final   Culture   Final    NO GROWTH 2 DAYS Performed at [facility], [address], [place], [place] [address]    Report Status PENDING  Incomplete  Culture, blood (routine x 2)     Status: None (Preliminary result)   Collection Time: 01/09/21  1:39 PM   Specimen: BLOOD  Result Value Ref Range Status   Specimen Description BLOOD  Final   Special Requests NONE  Final   Culture   Final    NO GROWTH 2 DAYS Performed at [facility], [address] [place], [place] [address]    Report Status PENDING  Incomplete     Labs: BNP (last 3 results) No results for input(s): BNP in the last 8760 hours. Basic Metabolic Panel: Recent Labs  Lab 01/08/21 1140 01/09/21 0446 01/10/21 0732 01/11/21 0649  NA 130* 136 138 138  K 3.6 3.5 3.5 3.6  CL 98 102 106 105  CO2 '22 26 27 26  '$ GLUCOSE 129* 92 108* 118*  BUN '22 15 12 10  '$ CREATININE 0.91 0.78 0.82 0.70  CALCIUM 8.3* 8.0* 7.7* 8.0*   Liver Function Tests: Recent Labs  Lab 01/08/21 1140 01/09/21 0446  01/10/21 0732 01/11/21 0649  AST 195* 115* 185* 155*  ALT 251* 172* 175* 171*  ALKPHOS 178* 180* 268* 301*  BILITOT 3.3* 3.2* 2.5* 1.8*  PROT 6.6 5.5* 5.4* 5.3*  ALBUMIN 3.1* 2.6* 2.4* 2.5*   Recent Labs  Lab 01/08/21 1140  LIPASE 38   No results for input(s): AMMONIA in the last 168 hours. CBC: Recent Labs  Lab 01/08/21 1140 01/09/21 0446 01/10/21 1055 01/11/21 1239  WBC 10.4 6.3 6.3 7.5  NEUTROABS 9.1*  --  4.3 5.2  HGB  13.5 12.0 11.5* 12.0  HCT 40.7 37.4 36.0 37.1  MCV 85.1 86.8 87.0 87.3  PLT 153 148* 155 186   Cardiac Enzymes: No results for input(s): CKTOTAL, CKMB, CKMBINDEX, TROPONINI in the last 168 hours. BNP: Invalid input(s): POCBNP CBG: No results for input(s): GLUCAP in the last 168 hours. D-Dimer No results for input(s): DDIMER in the last 72 hours. Hgb A1c No results for input(s): HGBA1C in the last 72 hours. Lipid Profile No results for input(s): CHOL, HDL, LDLCALC, TRIG, CHOLHDL, LDLDIRECT in the last 72 hours. Thyroid function studies No results for input(s): TSH, T4TOTAL, T3FREE, THYROIDAB in the last 72 hours.  Invalid input(s): FREET3 Anemia work up Recent Labs    01/09/21 1339 01/11/21 1239  FERRITIN  --  132  TIBC 219*  --   IRON 18*  --    Urinalysis    Component Value Date/Time   COLORURINE YELLOW 01/08/2021 1114   APPEARANCEUR CLEAR 01/08/2021 1114   APPEARANCEUR Cloudy (A) 01/04/2021 1428   LABSPEC 1.011 01/08/2021 1114   PHURINE 6.0 01/08/2021 1114   GLUCOSEU NEGATIVE 01/08/2021 1114   HGBUR NEGATIVE 01/08/2021 1114   [place] 01/08/2021 1114   BILIRUBINUR Negative 01/04/2021 1428   KETONESUR 5 (A) 01/08/2021 1114   PROTEINUR NEGATIVE 01/08/2021 1114   UROBILINOGEN 0.2 08/23/2013 1106   NITRITE NEGATIVE 01/08/2021 1114   LEUKOCYTESUR NEGATIVE 01/08/2021 1114   Sepsis Labs Invalid input(s): PROCALCITONIN,  WBC,  LACTICIDVEN Microbiology Recent Results (from the past 240 hour(s))  Microscopic  Examination     Status: Abnormal   Collection Time: 01/04/21  2:28 PM   Urine  Result Value Ref Range Status   WBC, UA >30 (A) 0 - 5 /hpf Final   RBC 0-2 0 - 2 /hpf Final   Epithelial Cells (non renal) 0-10 0 - 10 /hpf Final   Renal Epithel, UA None seen None seen /hpf Final   Casts None seen None seen /lpf Final   Bacteria, UA Moderate (A) None seen/Few Final  Urine Culture     Status: Abnormal   Collection Time: 01/04/21  4:41 PM   Specimen: Urine, Clean Catch   UR  Result Value Ref Range Status   Urine Culture, Routine Final report (A)  Final   Organism ID, Bacteria Escherichia coli (A)  Final    Comment: Cefazolin <=4 ug/mL Cefazolin with an MIC <=16 predicts susceptibility to the oral agents cefaclor, cefdinir, cefpodoxime, cefprozil, cefuroxime, cephalexin, and loracarbef when used for therapy of uncomplicated urinary tract infections due to E. coli, Klebsiella pneumoniae, and Proteus mirabilis. 50,000-100,000 colony forming units per mL    Antimicrobial Susceptibility Comment  Final    Comment:       ** S = Susceptible; I = Intermediate; R = Resistant **                    P = Positive; N = Negative             MICS are expressed in micrograms per mL    Antibiotic                 RSLT#1    RSLT#2    RSLT#3    RSLT#4 Amoxicillin/Clavulanic Acid    S Ampicillin                     S Cefepime  S Ceftriaxone                    S Cefuroxime                     S Ciprofloxacin                  S Ertapenem                      S Gentamicin                     S Imipenem                       S Levofloxacin                   S Meropenem                      S Nitrofurantoin                 S Piperacillin/Tazobactam        S Tetracycline                   S Tobramycin                     S Trimethoprim/Sulfa             S   Urine Culture     Status: None   Collection Time: 01/08/21 11:14 AM   Specimen: Urine, Clean Catch  Result Value Ref Range  Status   Specimen Description   Final    URINE, CLEAN CATCH Performed at [facility], [address], [place], [place] [address]    Special Requests   Final    NONE Performed at [facility], [address], [place], [place] [address]    Culture   Final    NO GROWTH Performed at [facility], [place] [address], [place], [place] [address]    Report Status 01/10/2021 FINAL  Final  Resp Panel by RT-PCR (Flu A&B, Covid) Nasopharyngeal Swab     Status: None   Collection Time: 01/08/21 11:15 AM   Specimen: Nasopharyngeal Swab; Nasopharyngeal(NP) swabs in vial transport medium  Result Value Ref Range Status   SARS Coronavirus 2 by RT PCR NEGATIVE NEGATIVE Final    Comment: (NOTE) SARS-CoV-2 target nucleic acids are NOT DETECTED.  The SARS-CoV-2 RNA is generally detectable in upper respiratory specimens during the acute phase of infection. The lowest concentration of SARS-CoV-2 viral copies this assay can detect is 138 copies/mL. A negative result does not preclude SARS-Cov-2 infection and should not be used as the sole basis for treatment or other patient management decisions. A negative result may occur with  improper specimen collection/handling, submission of specimen other than nasopharyngeal swab, presence of viral mutation(s) within the areas targeted by this assay, and inadequate number of viral copies(<138 copies/mL). A negative result must be combined with clinical observations, patient history, and epidemiological information. The expected result is Negative.  Fact Sheet for Patients:  EntrepreneurPulse.com.au  Fact Sheet for Healthcare Providers:  IncredibleEmployment.be  This test is no t yet approved or cleared by the [place] FDA and  has been authorized for detection and/or diagnosis of SARS-CoV-2 by FDA under an Emergency Use Authorization (EUA). This EUA will remain  in effect (meaning this test  can be used) for the  duration of the COVID-19 declaration under Section 564(b)(1) of the Act, 21 U.S.C.section 360bbb-3(b)(1), unless the authorization is terminated  or revoked sooner.       Influenza A by PCR NEGATIVE NEGATIVE Final   Influenza B by PCR NEGATIVE NEGATIVE Final    Comment: (NOTE) The Xpert Xpress SARS-CoV-2/FLU/RSV plus assay is intended as an aid in the diagnosis of influenza from Nasopharyngeal swab specimens and should not be used as a sole basis for treatment. Nasal washings and aspirates are unacceptable for Xpert Xpress SARS-CoV-2/FLU/RSV testing.  Fact Sheet for Patients: EntrepreneurPulse.com.au  Fact Sheet for Healthcare Providers: IncredibleEmployment.be  This test is not yet approved or cleared by the [place] FDA and has been authorized for detection and/or diagnosis of SARS-CoV-2 by FDA under an Emergency Use Authorization (EUA). This EUA will remain in effect (meaning this test can be used) for the duration of the COVID-19 declaration under Section 564(b)(1) of the Act, 21 U.S.C. section 360bbb-3(b)(1), unless the authorization is terminated or revoked.  Performed at [facility], [address], [place], [place] [address]   Culture, blood (routine x 2)     Status: None (Preliminary result)   Collection Time: 01/09/21  1:39 PM   Specimen: BLOOD  Result Value Ref Range Status   Specimen Description BLOOD  Final   Special Requests NONE  Final   Culture   Final    NO GROWTH 2 DAYS Performed at [facility], [address], [place], [place] [address]    Report Status PENDING  Incomplete  Culture, blood (routine x 2)     Status: None (Preliminary result)   Collection Time: 01/09/21  1:39 PM   Specimen: BLOOD  Result Value Ref Range Status   Specimen Description BLOOD  Final   Special Requests NONE  Final   Culture   Final    NO GROWTH 2 DAYS Performed at [facility], [address] [place], [place] [address]    Report  Status PENDING  Incomplete   Time spent: 30 min  SIGNED:   Marylu Lund, MD  [facility] 01/11/2021, 5:54 PM  If 7PM-7AM, please contact night-coverage

## 2021-01-12 LAB — ENA+DNA/DS+ANTICH+CENTRO+JO...
Anti JO-1: <0.2 AI (ref 0.0–0.9)
Centromere Ab Screen: <0.2 AI (ref 0.0–0.9)
Chromatin Ab SerPl-aCnc: <0.2 AI (ref 0.0–0.9)
ENA SM Ab Ser-aCnc: <0.2 AI (ref 0.0–0.9)
Ribonucleic Protein: <0.2 AI (ref 0.0–0.9)
SSA (Ro) (ENA) Antibody, IgG: <0.2 AI (ref 0.0–0.9)
SSB (La) (ENA) Antibody, IgG: <0.2 AI (ref 0.0–0.9)
Scleroderma (Scl-70) (ENA) Antibody, IgG: 2 AI — ABNORMAL HIGH (ref 0.0–0.9)
ds DNA Ab: 1 IU/mL (ref 0–9)

## 2021-01-12 LAB — ANA W/REFLEX IF POSITIVE: Anti Nuclear Antibody (ANA): POSITIVE — AB

## 2021-01-13 ENCOUNTER — Other Ambulatory Visit: Payer: Self-pay | Admitting: *Deleted

## 2021-01-13 ENCOUNTER — Telehealth: Payer: Self-pay | Admitting: Gastroenterology

## 2021-01-13 DIAGNOSIS — R7989 Other specified abnormal findings of blood chemistry: Secondary | ICD-10-CM

## 2021-01-13 LAB — IMMUNOGLOBULINS A/E/G/M, SERUM
IgA: 112 mg/dL (ref 64–422)
IgE (Immunoglobulin E), Serum: 22 IU/mL (ref 6–495)
IgG (Immunoglobin G), Serum: 899 mg/dL (ref 586–1602)
IgM (Immunoglobulin M), Srm: 91 mg/dL (ref 26–217)

## 2021-01-13 NOTE — Telephone Encounter (Signed)
Patient was discharged from the hospital 8/16. She has follow-up with Dr. Laural Golden, her primary GI provider on 02/01/21, which is fine.   However, she needs repeat HFP and INR on Friday 01/14/21 if possible. Please arrange. Dx: Elevated LFTs.

## 2021-01-13 NOTE — Telephone Encounter (Signed)
Spoke to pt informed her of recommendations to have blood work drawn 01/14/21. Pt voiced understanding and requested to have labs done at [facility] were entered into Epic.

## 2021-01-13 NOTE — Telephone Encounter (Signed)
Noted  

## 2021-01-14 DIAGNOSIS — R945 Abnormal results of liver function studies: Secondary | ICD-10-CM | POA: Diagnosis not present

## 2021-01-14 DIAGNOSIS — R7989 Other specified abnormal findings of blood chemistry: Secondary | ICD-10-CM | POA: Diagnosis not present

## 2021-01-14 DIAGNOSIS — F419 Anxiety disorder, unspecified: Secondary | ICD-10-CM | POA: Diagnosis not present

## 2021-01-14 DIAGNOSIS — N390 Urinary tract infection, site not specified: Secondary | ICD-10-CM | POA: Diagnosis not present

## 2021-01-14 DIAGNOSIS — T148XXD Other injury of unspecified body region, subsequent encounter: Secondary | ICD-10-CM | POA: Diagnosis not present

## 2021-01-14 LAB — CULTURE, BLOOD (ROUTINE X 2)
Culture: NO GROWTH
Culture: NO GROWTH

## 2021-01-15 LAB — HEPATIC FUNCTION PANEL
AG Ratio: 1.4 (calc) (ref 1.0–2.5)
ALT: 90 U/L — ABNORMAL HIGH (ref 6–29)
AST: 49 U/L — ABNORMAL HIGH (ref 10–35)
Albumin: 3.7 g/dL (ref 3.6–5.1)
Alkaline phosphatase (APISO): 248 U/L — ABNORMAL HIGH (ref 37–153)
Bilirubin, Direct: 0.4 mg/dL — ABNORMAL HIGH (ref 0.0–0.2)
Globulin: 2.7 g/dL (calc) (ref 1.9–3.7)
Indirect Bilirubin: 0.5 mg/dL (calc) (ref 0.2–1.2)
Total Bilirubin: 0.9 mg/dL (ref 0.2–1.2)
Total Protein: 6.4 g/dL (ref 6.1–8.1)

## 2021-01-15 LAB — PROTIME-INR
INR: 0.9
Prothrombin Time: 9.3 s (ref 9.0–11.5)

## 2021-01-17 DIAGNOSIS — R600 Localized edema: Secondary | ICD-10-CM | POA: Diagnosis not present

## 2021-01-26 ENCOUNTER — Telehealth: Payer: Self-pay

## 2021-02-01 ENCOUNTER — Other Ambulatory Visit: Payer: Self-pay

## 2021-02-01 ENCOUNTER — Encounter (INDEPENDENT_AMBULATORY_CARE_PROVIDER_SITE_OTHER): Payer: Self-pay | Admitting: Gastroenterology

## 2021-02-01 ENCOUNTER — Ambulatory Visit (INDEPENDENT_AMBULATORY_CARE_PROVIDER_SITE_OTHER): Payer: PPO | Admitting: Gastroenterology

## 2021-02-01 VITALS — BP 134/83 | HR 109 | Temp 98.1°F | Ht 64.0 in | Wt 155.1 lb

## 2021-02-01 DIAGNOSIS — K219 Gastro-esophageal reflux disease without esophagitis: Secondary | ICD-10-CM

## 2021-02-01 DIAGNOSIS — R7989 Other specified abnormal findings of blood chemistry: Secondary | ICD-10-CM | POA: Diagnosis not present

## 2021-02-01 DIAGNOSIS — D509 Iron deficiency anemia, unspecified: Secondary | ICD-10-CM | POA: Insufficient documentation

## 2021-02-01 DIAGNOSIS — K580 Irritable bowel syndrome with diarrhea: Secondary | ICD-10-CM | POA: Diagnosis not present

## 2021-02-01 DIAGNOSIS — R7401 Elevation of levels of liver transaminase levels: Secondary | ICD-10-CM | POA: Insufficient documentation

## 2021-02-01 NOTE — Progress Notes (Signed)
Referring Provider: Gareth Morgan, MD Primary Care Physician:  Gareth Morgan, MD Primary GI Physician: Rehman  Chief Complaint  Patient presents with   Hospitalization Follow-up    Patient here today for a hospital follow up visit from 01/08/2021, when she had an intolerance to Macrobid, which caused elevated LFT's. Had been taking Tylenol 1000mg  Q six hours at that time. Patient states she has IBS with D and has a lot of issues with the diarrhea, she has 2-3 maybe 4 bm's per day. She states her appetite is good.   HPI:   Stephanie Wood is a 86 y.o. female with past medical history of anemia, anxiety, GERD, depression and IBS-D.  Patient presenting today for follow up after she was found to have elevated LFTs and IDA during admission to hospital in august.  Patient was diagnosed with UTI and prescribed macrobid which she took 6 doses of, she continued to have fevers, she went to Urgent care where she got an injection of rocephin. She was also taking extra strength tylenol every 6 hours x2 days at that time. Symptoms did not improve so she proceeded to the ER where she was found to have elevated LFTs at that time, AST 195, ALT 251, T bili 3.3, alk phos as high as 301, INR WNL. CT abdomen/pelvis unremarkable from GI standpoint. She was thought to have drug induced liver injury secondary to macrobid. Atorvastatin held at discharge until LFTs normalize.  Last LFTs 01/14/21: AST 49, ALT 90, Alk Phos 248, T bili 0.4.   She was also found to have some mild anemia with hgb as low as 11.5 during admission with one reported episode of black stools. Ferritin was WNL at 132, Iron 18, TIBC 219. No blood transfusions were required, however, EGD and colonoscopy were discussed as future procedures given patients IDA. She currently takes flintstone multivitamin twice weekly for hx of IDA (per EMR since atleast 2013).  Patient denies any recent black or bloody stools. She denies any fatigue, dizziness or  sob.   She reports some ongoing diarrhea, hx of IBS-D, takes immodium with good relief. She denies nausea, vomiting, some mild lower abdominal pain related to her IBS-D. She reports a few pounds of weight loss but she has recently tried to do a low sodium diet and feels this is likely due to decreased edema/water weight. Denies swelling to her abdomen. Some swelling to LEs but this has been ongoing and is improved since PCP had her implement los sodium diet. No episodes of confusion noted by patient or her daughter who is present.   Last Colonoscopy:11/18/15Prep excellent. Normal mucosa of cecum, ascending colon, hepatic flexure, transverse colon, splenic flexure, descending and sigmoid colon. Single small diverticulum noted at sigmoid colon. Normal rectal mucosa. Small hemorrhoids below the dentate line along with anal papillae Last Endoscopy:4/24/13Mild changes of reflux esophagitis limited to GE junction. Moderate size sliding hiatal hernia with abnormal appearance to mucosa and focal gastritis. Single erosion below the level of hiatus and antral scar. Normal bulbar and post bulbar mucosa.  Recommendations:  Consider egd/colonscopy if anemia recurs  Past Medical History:  Diagnosis Date   Acid reflux disease    Anemia    Anxiety    Colonic adenoma    Depression    Irritable bowel syndrome     Past Surgical History:  Procedure Laterality Date   CATARACT EXTRACTION     COLONOSCOPY N/A 04/15/2014   Procedure: COLONOSCOPY;  Surgeon: 04/17/2014, MD;  [facility]: [facility];  Service: Endoscopy;  Laterality: N/A;  730   ESOPHAGOGASTRODUODENOSCOPY  09/20/2011   Procedure: ESOPHAGOGASTRODUODENOSCOPY (EGD);  Surgeon: Rogene Houston, MD;  Location: [facility];  Service: Endoscopy;  Laterality: N/A;  215   EYE SURGERY     bil catatract surgery    Current Outpatient Medications  Medication Sig Dispense Refill   ALPRAZolam (XANAX) 0.5 MG tablet Take 0.5 mg by mouth at bedtime  as needed for sleep or anxiety. Sleep     bifidobacterium infantis (ALIGN) capsule Take 1 capsule by mouth daily.     Calcium Carbonate (CALCIUM 600 PO) Take 1 capsule by mouth daily.     ELDERBERRY PO Take 1 capsule by mouth daily.     famotidine (PEPCID) 20 MG tablet Take 20 mg by mouth 2 (two) times daily.     FLUTICASONE PROPIONATE, NASAL, NA Place 1 spray into the nose daily as needed (rhinitis).     ipratropium (ATROVENT) 0.06 % nasal spray Place 2 sprays into both nostrils 2 (two) times daily as needed for rhinitis.     loperamide (IMODIUM) 2 MG capsule Take 2 mg by mouth daily as needed for diarrhea or loose stools. 30 capsule    loratadine (CLARITIN) 10 MG tablet Take 10 mg by mouth daily.     ondansetron (ZOFRAN) 4 MG tablet Take 1 tablet (4 mg total) by mouth every 6 (six) hours. 12 tablet 0   Pediatric Multivitamins-Iron (FLINTSTONES PLUS IRON) chewable tablet Chew 1 tablet by mouth 2 (two) times a week.     PROLIA 60 MG/ML SOSY injection SMARTSIG:SUB-Q Twice a Year     vitamin B-12 (CYANOCOBALAMIN) 1000 MCG tablet Take 1,000 mcg by mouth daily.     Vitamin D, Ergocalciferol, (DRISDOL) 50000 UNITS CAPS capsule Take 50,000 Units by mouth every 7 (seven) days. Takes on Saturdays.     No current facility-administered medications for this visit.    Allergies as of 02/01/2021 - Review Complete 02/01/2021  Allergen Reaction Noted   Drug class [trazodone and nefazodone]  06/29/2020   Macrobid [nitrofurantoin] Other (See Comments) 01/11/2021   No known allergies  06/29/2020    Family History  Problem Relation Age of Onset   Prostate cancer Father     Social History   Socioeconomic History   Marital status: Widowed    Spouse name: Not on file   Number of children: 1   Years of education: Not on file   Highest education level: Not on file  Occupational History   Occupation: reitred  Tobacco Use   Smoking status: Never   Smokeless tobacco: Never  Vaping Use   Vaping Use:  Never used  Substance and Sexual Activity   Alcohol use: No    Alcohol/week: 0.0 standard drinks   Drug use: No   Sexual activity: Not on file  Other Topics Concern   Not on file  Social History Narrative   Not on file   Social Determinants of Health   Financial Resource Strain: Not on file  Food Insecurity: Not on file  Transportation Needs: Not on file  Physical Activity: Not on file  Stress: Not on file  Social Connections: Not on file    Review of Systems: Gen: Denies fever, chills, anorexia. Denies fatigue, weakness, weight loss.  CV: Denies chest pain, palpitations, syncope, peripheral edema, and claudication. Resp: Denies dyspnea at rest, cough, wheezing, coughing up blood, and pleurisy. GI: Denies vomiting blood, jaundice, and fecal incontinence. Denies dysphagia  or odynophagia. Denies melena or hematochezia, +diarrhea Derm: Denies rash, itching, dry skin Psych: Denies depression, anxiety, memory loss, confusion. No homicidal or suicidal ideation.  Heme: Denies bruising, bleeding, and enlarged lymph nodes.  Physical Exam: BP 134/83 (BP Location: Left Arm, Patient Position: Sitting, Cuff Size: Small)   Pulse (!) 109   Temp 98.1 F (36.7 C) (Oral)   Ht $R'5\' 4"'Kt$  (1.626 m)   Wt 155 lb 1.6 oz (70.4 kg)   BMI 26.62 kg/m  General:   Alert and oriented. No distress noted. Pleasant and cooperative.  Head:  Normocephalic and atraumatic. Eyes:  Conjuctiva clear without scleral icterus. Mouth:  Oral mucosa pink and moist. Good dentition. No lesions. Heart: Normal rate and rhythm, s1 and s2 heart sounds present.  Lungs: Clear lung sounds in all lobes. Respirations equal and unlabored. Abdomen:  +BS, soft, non-tender and non-distended. No rebound or guarding. No HSM or masses noted. Derm: No palmar erythema or jaundice Msk:  Symmetrical without gross deformities. Normal posture. Extremities:  Without edema. Neurologic:  Alert and  oriented x4 Psych:  Alert and cooperative.  Normal mood and affect.  Invalid input(s): 6 MONTHS   ASSESSMENT: Stephanie Wood is a 86 y.o. female presenting today for hospital follow up after admission in mid august.  Patient had UTI in August, treated with macrobid and rocephin, she was found to have elevated LFTs and mild anemia on presentation to ED after UTI symptoms/fever continued. Of note, she did have a positive ANA (nonspecific) in the hospital, however, all other autoimmune hepatitis serologies were negative. Patient also reports she was taking extra strength tylenol every 6 hours x2 days at the time of her UTI in august as well. Lipitor has been held since admission due to her continue elevation of LFTs. I suggested we hold this until we recheck LFTs today. She is supposed to see PCP in a few weeks and will discuss lipitor with them at that time, depending on how her liver function is looking on labs today.  We will recheck cmp, cbc and INR today. LFTs were trending down at last check 01/14/21. And last CBC showed low normal hgb of 12. Patient denies any jaundice, confusion, ascites, bruising, melena or hematochezia.   If labs show continued anemia, this will need further evaluation with EGD and colonoscopy which I discussed with patient and her daughter. She reports one episode of black stools during hospital admission, however, this was thought related to macrobid, no other episodes since. Per EMR she appears to have a long standing hx of IDA (since 2013) and currently takes flintstone vitamin with iron twice weekly. She denies fatigue, sob, weakness, dizziness.   History of IBS-D, states diarrhea is well controlled with immodium, having 2-3 BMs per day.  She takes pepcid $RemoveBefo'20mg'crEvqkDRePa$  BID without any reported breakthrough reflux, no cough, sore throat or hoarse voice. Intolerant to PPI in the past (caused diarrhea).   PLAN:  Recheck LFTs, CBC and INR 2. Will need to consider EGD/Colonoscopy if anemia recurs 3. Continue on flintstone  multivitamin with iron  4. Continue pepcid $RemoveBeforeD'20mg'[id number]$  BID 5. Continue immodium as needed for diarrhea 6. Continue to hold lipitor until lab results are back   Follow Up: 3 months  Lynden Carrithers L. Alver Sorrow, MSN, APRN, AGNP-C Adult-Gerontology Nurse Practitioner [facility]

## 2021-02-01 NOTE — Patient Instructions (Signed)
-  We will check your liver function and hemoglobin again today. -Continue pepcid 30 minutes before breakfast and 30 minutes before dinner -if you are still showing signs of anemia we may need to proceed with a colonoscopy/egd to evaluate further for causes of blood loss  Follow up 3 months

## 2021-02-01 NOTE — Telephone Encounter (Signed)
Message sent to MD

## 2021-02-02 DIAGNOSIS — R7989 Other specified abnormal findings of blood chemistry: Secondary | ICD-10-CM | POA: Diagnosis not present

## 2021-02-03 LAB — CBC
HCT: 41.4 % (ref 35.0–45.0)
Hemoglobin: 13.4 g/dL (ref 11.7–15.5)
MCH: 27.1 pg (ref 27.0–33.0)
MCHC: 32.4 g/dL (ref 32.0–36.0)
MCV: 83.8 fL (ref 80.0–100.0)
MPV: 11.6 fL (ref 7.5–12.5)
Platelets: 206 10*3/uL (ref 140–400)
RBC: 4.94 10*6/uL (ref 3.80–5.10)
RDW: 13.8 % (ref 11.0–15.0)
WBC: 6.5 10*3/uL (ref 3.8–10.8)

## 2021-02-03 LAB — COMPREHENSIVE METABOLIC PANEL
AG Ratio: 1.4 (calc) (ref 1.0–2.5)
ALT: 16 U/L (ref 6–29)
AST: 23 U/L (ref 10–35)
Albumin: 4.2 g/dL (ref 3.6–5.1)
Alkaline phosphatase (APISO): 107 U/L (ref 37–153)
BUN/Creatinine Ratio: 14 (calc) (ref 6–22)
BUN: 17 mg/dL (ref 7–25)
CO2: 30 mmol/L (ref 20–32)
Calcium: 10.1 mg/dL (ref 8.6–10.4)
Chloride: 100 mmol/L (ref 98–110)
Creat: 1.18 mg/dL — ABNORMAL HIGH (ref 0.60–0.95)
Globulin: 3.1 g/dL (calc) (ref 1.9–3.7)
Glucose, Bld: 93 mg/dL (ref 65–99)
Potassium: 4.8 mmol/L (ref 3.5–5.3)
Sodium: 137 mmol/L (ref 135–146)
Total Bilirubin: 0.7 mg/dL (ref 0.2–1.2)
Total Protein: 7.3 g/dL (ref 6.1–8.1)

## 2021-02-03 LAB — PROTIME-INR
INR: 0.9
Prothrombin Time: 9.4 s (ref 9.0–11.5)

## 2021-02-10 ENCOUNTER — Encounter: Payer: Self-pay | Admitting: Urology

## 2021-02-10 ENCOUNTER — Ambulatory Visit: Payer: PPO | Admitting: Urology

## 2021-02-10 ENCOUNTER — Other Ambulatory Visit: Payer: Self-pay

## 2021-02-10 VITALS — BP 164/90 | HR 109

## 2021-02-10 DIAGNOSIS — R102 Pelvic and perineal pain: Secondary | ICD-10-CM

## 2021-02-10 DIAGNOSIS — N390 Urinary tract infection, site not specified: Secondary | ICD-10-CM | POA: Diagnosis not present

## 2021-02-10 DIAGNOSIS — N952 Postmenopausal atrophic vaginitis: Secondary | ICD-10-CM | POA: Diagnosis not present

## 2021-02-10 LAB — MICROSCOPIC EXAMINATION
Bacteria, UA: NONE SEEN
Epithelial Cells (non renal): NONE SEEN /hpf (ref 0–10)
RBC, Urine: NONE SEEN /hpf (ref 0–2)
Renal Epithel, UA: NONE SEEN /hpf

## 2021-02-10 LAB — URINALYSIS, ROUTINE W REFLEX MICROSCOPIC
Bilirubin, UA: NEGATIVE
Glucose, UA: NEGATIVE
Ketones, UA: NEGATIVE
Nitrite, UA: NEGATIVE
Protein,UA: NEGATIVE
RBC, UA: NEGATIVE
Specific Gravity, UA: 1.01 (ref 1.005–1.030)
Urobilinogen, Ur: 0.2 mg/dL (ref 0.2–1.0)
pH, UA: 6 (ref 5.0–7.5)

## 2021-02-10 MED ORDER — ESTRADIOL 0.1 MG/GM VA CREA: TOPICAL_CREAM | VAGINAL | 3 refills | Status: DC

## 2021-02-10 NOTE — Progress Notes (Signed)
Pt is prepped for and in and out catherization. Patient was cleaned and prepped in a sterle fashion with betadine. A 14 fr catheter foley was inserted. Urine return was note 35 ml.  Performed by [facility] LPN  Urological Symptom Review  Patient is experiencing the following symptoms: Painful urination   Review of Systems  Gastrointestinal (upper)  : Negative for upper GI symptoms  Gastrointestinal (lower) : Negative for lower GI symptoms  Constitutional : Negative for symptoms  Skin: Negative for skin symptoms  Eyes: Negative for eye symptoms  Ear/Nose/Throat : Negative for Ear/Nose/Throat symptoms  Hematologic/Lymphatic: Negative for Hematologic/Lymphatic symptoms  Cardiovascular : Negative for cardiovascular symptoms  Respiratory : Negative for respiratory symptoms  Endocrine: Negative for endocrine symptoms  Musculoskeletal: Negative for musculoskeletal symptoms  Neurological: Negative for neurological symptoms  Psychologic: Negative for psychiatric symptoms

## 2021-02-10 NOTE — Progress Notes (Signed)
History of Present Illness: Stephanie Wood is a 86 y.o. year old female for followup.  2.1.2022: 86 y.o. year old female new patient here for evaluation of her recurrent UTIs. Her first UTI presented approximately 4 months prior. She recognizes these UTIs by the associated dysuria and pain. She estimates that prior to this most recent episode, she has been treated for UTIs 10 times throughout her life. During this most recent episode she has experienced fever, chills, and nausea but she denies any associated gross hematuria. She has been with 3-4 courses of abx since November, one of which was for 30 days (sulfa, nitrofurantoin). She currently feels that she has a UTI citing slight dysuria. She has a good FOS and feels she can empty her bladder completely. She notes some fecal incontinence due to IBS. She typically wears pads for this but has discontinued out of concern for bacteria.   Urine C&S + for Proteus. Renal U/S nml except for renal cyst.   She was placed on methenamine as well as probiotics.   5.3.2022: She denies recent issues with dysuria, frequency or urgency.  She has tolerated the Methenamine  well.  8.9.2022: Over the past 2 weeks she has had some dysuria and more frequent nation.  No fever, chills, no gross hematuria.  She has had a fairly quiescent episode since she started methenamine  02/10/21: [name] returns today with recurrent UTI symptoms.   She was recently hospitalized with liver dysfunction secondary to nitrofurantoin that was prescribed after he visit on 01/04/21.  She had pansensitive e. Coli on 8/9 and a culture on 8/13 was negative.  She did get rocephin and keflex after the nitrofurantoin because of a fever.  She has some pain in the vaginal area.   She doesn't have urgency.  Her cath UA just has trace LE today.       Past Medical History:  Diagnosis Date   Acid reflux disease    Anemia    Anxiety    Colonic adenoma    Depression    Irritable bowel syndrome     Past  Surgical History:  Procedure Laterality Date   CATARACT EXTRACTION     COLONOSCOPY N/A 04/15/2014   Procedure: COLONOSCOPY;  Surgeon: Rogene Houston, MD;  [facility]: [facility];  Service: Endoscopy;  Laterality: N/A;  730   ESOPHAGOGASTRODUODENOSCOPY  09/20/2011   Procedure: ESOPHAGOGASTRODUODENOSCOPY (EGD);  Surgeon: Rogene Houston, MD;  [facility];  Service: Endoscopy;  Laterality: N/A;  215   EYE SURGERY     bil catatract surgery    Home Medications:  (Not in a hospital admission)   Allergies:  Allergies  Allergen Reactions   Drug Class [Trazodone And Nefazodone]    Macrobid [Nitrofurantoin] Other (See Comments)    Elevated LFT's   No Known Allergies     Family History  Problem Relation Age of Onset   Prostate cancer Father     Social History:  reports that she has never smoked. She has never used smokeless tobacco. She reports that she does not drink alcohol and does not use drugs.  ROS: A complete review of systems was performed.  All systems are negative except for pertinent findings as noted.  Physical Exam:  BP (!) 164/90   Pulse (!) 109    I have reviewed prior pt notes  I have reviewed urinalysis results  I have reviewed prior urine culture   Impression/Assessment:  Recurrent UTI with hepatoxicity favored to be  secondary to nitrofurantoin.  Her UA is unremarkable today with only tr LE but I will get a culture.  Atrophic vaginitis with pain.  I am going to start her on low dose estrace cream and reviewed the side effects and instructions.       Stephanie Wood 02/10/2021, 4:29 PM  Stephanie Wood. Dahlstedt MD

## 2021-02-12 LAB — URINE CULTURE: Organism ID, Bacteria: NO GROWTH

## 2021-02-14 ENCOUNTER — Telehealth: Payer: Self-pay

## 2021-02-14 NOTE — Telephone Encounter (Signed)
Patient called and notified. Voiced understanding.  

## 2021-02-14 NOTE — Telephone Encounter (Signed)
-----   Message from [name], MD sent at 02/14/2021 12:08 PM EDT ----- negative ----- Message ----- From: Dorisann Frames, RN Sent: 02/14/2021   8:51 AM EDT To: [name], MD  Please review- saw patient in Dr. Diona Fanti absence

## 2021-02-15 DIAGNOSIS — R748 Abnormal levels of other serum enzymes: Secondary | ICD-10-CM | POA: Diagnosis not present

## 2021-02-15 DIAGNOSIS — F419 Anxiety disorder, unspecified: Secondary | ICD-10-CM | POA: Diagnosis not present

## 2021-02-15 DIAGNOSIS — E782 Mixed hyperlipidemia: Secondary | ICD-10-CM | POA: Diagnosis not present

## 2021-02-15 DIAGNOSIS — R600 Localized edema: Secondary | ICD-10-CM | POA: Diagnosis not present

## 2021-02-15 DIAGNOSIS — Z23 Encounter for immunization: Secondary | ICD-10-CM | POA: Diagnosis not present

## 2021-03-15 DIAGNOSIS — L989 Disorder of the skin and subcutaneous tissue, unspecified: Secondary | ICD-10-CM | POA: Diagnosis not present

## 2021-03-15 DIAGNOSIS — F419 Anxiety disorder, unspecified: Secondary | ICD-10-CM | POA: Diagnosis not present

## 2021-05-10 ENCOUNTER — Ambulatory Visit: Payer: PPO | Admitting: Urology

## 2021-05-10 ENCOUNTER — Encounter: Payer: Self-pay | Admitting: Urology

## 2021-05-10 ENCOUNTER — Other Ambulatory Visit: Payer: Self-pay

## 2021-05-10 VITALS — BP 152/84 | HR 108

## 2021-05-10 DIAGNOSIS — N390 Urinary tract infection, site not specified: Secondary | ICD-10-CM

## 2021-05-10 DIAGNOSIS — N281 Cyst of kidney, acquired: Secondary | ICD-10-CM | POA: Diagnosis not present

## 2021-05-10 NOTE — Progress Notes (Signed)
History of Present Illness: Stephanie Wood is a 86 y.o. year old female here for follow-up of history of recurrent urinary tract infections.  2.1.2022: 86 y.o. year old female new patient here for evaluation of her recurrent UTIs. Her first UTI presented approximately 4 months prior. She recognizes these UTIs by the associated dysuria and pain. She estimates that prior to this most recent episode, she has been treated for UTIs 10 times throughout her life. During this most recent episode she has experienced fever, chills, and nausea but she denies any associated gross hematuria. She has been with 3-4 courses of abx since November, one of which was for 30 days (sulfa, nitrofurantoin). She currently feels that she has a UTI citing slight dysuria. She has a good FOS and feels she can empty her bladder completely. She notes some fecal incontinence due to IBS. She typically wears pads for this but has discontinued out of concern for bacteria.   Urine C&S + for Proteus. Renal U/S nml except for renal cyst.   She was placed on methenamine as well as probiotics.   5.3.2022: She denies recent issues with dysuria, frequency or urgency.  She has tolerated the Methenamine  well.   8.9.2022: Over the past 2 weeks she has had some dysuria and more frequent nation.  No fever, chills, no gross hematuria.  She has had a fairly quiescent episode since she started methenamine   02/10/21: Stephanie Wood returns today with recurrent UTI symptoms.   She was recently hospitalized with liver dysfunction secondary to nitrofurantoin that was prescribed after he visit on 01/04/21.  She had pansensitive e. Coli on 8/9 and a culture on 8/13 was negative.  She did get rocephin and keflex after the nitrofurantoin because of a fever.  She has some pain in the vaginal area.   She doesn't have urgency.  Her cath UA just has trace LE today.     12.13.2022: No real urinary tract infections over the past 3 months.  She is still on methenamine twice a  day.  She is also on Estrace cream.  Past Medical History:  Diagnosis Date   Acid reflux disease    Anemia    Anxiety    Colonic adenoma    Depression    Irritable bowel syndrome     Past Surgical History:  Procedure Laterality Date   CATARACT EXTRACTION     COLONOSCOPY N/A 04/15/2014   Procedure: COLONOSCOPY;  Surgeon: Rogene Houston, MD;  [facility];  Service: Endoscopy;  Laterality: N/A;  730   ESOPHAGOGASTRODUODENOSCOPY  09/20/2011   Procedure: ESOPHAGOGASTRODUODENOSCOPY (EGD);  Surgeon: Rogene Houston, MD;  [facility];  Service: Endoscopy;  Laterality: N/A;  215   EYE SURGERY     bil catatract surgery    Home Medications:  (Not in a hospital admission)   Allergies:  Allergies  Allergen Reactions   Drug Class [Trazodone And Nefazodone]    Macrobid [Nitrofurantoin] Other (See Comments)    Elevated LFT's   No Known Allergies     Family History  Problem Relation Age of Onset   Prostate cancer Father     Social History:  reports that she has never smoked. She has never used smokeless tobacco. She reports that she does not drink alcohol and does not use drugs.  ROS: A complete review of systems was performed.  All systems are negative except for pertinent findings as noted.  Physical Exam:  Vital signs in last 24  hours: @VSRANGES @ General:  Alert and oriented, No acute distress HEENT: Normocephalic, atraumatic Neck: No JVD or lymphadenopathy Cardiovascular: Regular rate  Lungs: Normal inspiratory/expiratory excursion Neurologic: Grossly intact  I have reviewed prior pt notes  I have reviewed notes from referring/previous physicians  I have reviewed urinalysis results   I have reviewed prior urine culture   Impression/Assessment:  History of recurrent urinary tract infections, on suppression with methenamine, she is doing well recently.  Also on perivaginal estrogen cream  Plan:  I will see her back in a year-continue same  medications  [name] Laveta Gilkey 05/10/2021, 3:13 PM  [name]. Raphaella Larkin MD  Never heard

## 2021-05-10 NOTE — Progress Notes (Signed)
Urological Symptom Review  Patient is experiencing the following symptoms: Get up at night to urinate   Review of Systems  Gastrointestinal (upper)  : Negative for upper GI symptoms  Gastrointestinal (lower) : Diarrhea  Constitutional : Negative for symptoms  Skin: Negative for skin symptoms  Eyes: Negative for eye symptoms  Ear/Nose/Throat : Sinus problems  Hematologic/Lymphatic: Negative for Hematologic/Lymphatic symptoms  Cardiovascular : Negative for cardiovascular symptoms  Respiratory : Negative for respiratory symptoms  Endocrine: Negative for endocrine symptoms  Musculoskeletal: Negative for musculoskeletal symptoms  Neurological: Negative for neurological symptoms  Psychologic: Negative for psychiatric symptoms

## 2021-05-11 ENCOUNTER — Other Ambulatory Visit (HOSPITAL_COMMUNITY): Payer: Self-pay | Admitting: Family Medicine

## 2021-05-11 DIAGNOSIS — Z1231 Encounter for screening mammogram for malignant neoplasm of breast: Secondary | ICD-10-CM

## 2021-05-11 LAB — URINALYSIS, ROUTINE W REFLEX MICROSCOPIC
Bilirubin, UA: NEGATIVE
Glucose, UA: NEGATIVE
Ketones, UA: NEGATIVE
Leukocytes,UA: NEGATIVE
Nitrite, UA: NEGATIVE
Protein,UA: NEGATIVE
RBC, UA: NEGATIVE
Specific Gravity, UA: <1.005 — ABNORMAL LOW (ref 1.005–1.030)
Urobilinogen, Ur: 0.2 mg/dL (ref 0.2–1.0)
pH, UA: 5.5 (ref 5.0–7.5)

## 2021-05-24 ENCOUNTER — Other Ambulatory Visit: Payer: Self-pay

## 2021-05-24 ENCOUNTER — Encounter (INDEPENDENT_AMBULATORY_CARE_PROVIDER_SITE_OTHER): Payer: Self-pay | Admitting: Internal Medicine

## 2021-05-24 ENCOUNTER — Ambulatory Visit (INDEPENDENT_AMBULATORY_CARE_PROVIDER_SITE_OTHER): Payer: PPO | Admitting: Internal Medicine

## 2021-05-24 VITALS — Ht 64.0 in | Wt 157.0 lb

## 2021-05-24 DIAGNOSIS — K219 Gastro-esophageal reflux disease without esophagitis: Secondary | ICD-10-CM

## 2021-05-24 DIAGNOSIS — K580 Irritable bowel syndrome with diarrhea: Secondary | ICD-10-CM

## 2021-05-24 NOTE — Progress Notes (Signed)
I connected with Stephanie Wood on 05/24/21 at 12:15 PM EST by telephone and verified that I am speaking with the correct person using two identifiers.  Location: Patient: home Provider: office   I discussed the limitations, risks, security and privacy concerns of performing an evaluation and management service by telephone and the availability of in person appointments. I also discussed with the patient that there may be a patient responsible charge related to this service. The patient expressed understanding and agreed to proceed.   History of Present Illness:  Patient is 86 year old Caucasian female who was scheduled for office visit today.  She requested telephone visit because she came into contact with her grandson over the weekend who who tested positive for COVID.  Patient is not have any symptoms.  Patient has a history of IBS and GERD as well as history of iron deficiency anemia but this has corrected with low-dose p.o. iron. She was last seen on 02/01/2021 following hospitalization in August 2022 for elevated transaminases felt to be due to nitrofurantoin induced hepatotoxicity.  Imaging studies were negative for obstructive process or choledocholithiasis.  Transaminases were normal at the time of her last visit.  Patient says she is doing well.  She has been keeping track of her bowel movements.  She says she only had 4 loose stools at different times in the last 2 months.  She took 3 doses of Imodium in the last 2 months.  She does not take famotidine often.  Occasionally she may take Tums for heartburn.  She denies dysphagia nausea vomiting or abdominal pain.  She does notice lower abdominal pain relieved with defecation.  She is prone to constipation.  She denies melena or rectal bleeding.  She says her appetite is not what it used to be.  She is still having difficult time since she lost her husband. She says she has an upcoming appointment with Dr. Karie Wood when she will have blood  work.    Current Outpatient Medications:    ALPRAZolam (XANAX) 0.5 MG tablet, Take 0.5 mg by mouth at bedtime as needed for sleep or anxiety. Sleep, Disp: , Rfl:    atorvastatin (LIPITOR) 20 MG tablet, Take 20 mg by mouth daily., Disp: , Rfl:    bifidobacterium infantis (ALIGN) capsule, Take 1 capsule by mouth daily., Disp: , Rfl:    Calcium Carbonate (CALCIUM 600 PO), Take 1 capsule by mouth daily., Disp: , Rfl:    ELDERBERRY PO, Take 1 capsule by mouth daily., Disp: , Rfl:    estradiol (ESTRACE) 0.1 MG/GM vaginal cream, Apply a pea sized amount (0.5 grams) vaginally at bedtime daily for 2 weeks and then 2 times weekly., Disp: 42.5 g, Rfl: 3   famotidine (PEPCID) 20 MG tablet, Take 20 mg by mouth 2 (two) times daily., Disp: , Rfl:    FLUTICASONE PROPIONATE, NASAL, NA, Place 1 spray into the nose daily as needed (rhinitis)., Disp: , Rfl:    ipratropium (ATROVENT) 0.06 % nasal spray, Place 2 sprays into both nostrils 2 (two) times daily as needed for rhinitis., Disp: , Rfl:    loperamide (IMODIUM) 2 MG capsule, Take 2 mg by mouth daily as needed for diarrhea or loose stools., Disp: 30 capsule, Rfl:    loratadine (CLARITIN) 10 MG tablet, Take 10 mg by mouth daily., Disp: , Rfl:    methenamine (HIPREX) 1 g tablet, Take 1 g by mouth 2 (two) times daily., Disp: , Rfl:    ondansetron (ZOFRAN) 4 MG tablet,  Take 1 tablet (4 mg total) by mouth every 6 (six) hours., Disp: 12 tablet, Rfl: 0   Pediatric Multivitamins-Iron (FLINTSTONES PLUS IRON) chewable tablet, Chew 1 tablet by mouth 2 (two) times a week., Disp: , Rfl:    PROLIA 60 MG/ML SOSY injection, SMARTSIG:SUB-Q Twice a Year, Disp: , Rfl:    vitamin B-12 (CYANOCOBALAMIN) 1000 MCG tablet, Take 1,000 mcg by mouth daily., Disp: , Rfl:    Vitamin D, Ergocalciferol, (DRISDOL) 50000 UNITS CAPS capsule, Take 50,000 Units by mouth every 7 (seven) days. Takes on Saturdays., Disp: , Rfl:   Observations/Objective:  Patient reported her weight to be 157  pounds.  She weighed herself at home 2 days ago. She weighed 155 pounds on 02/01/2021.   Assessment and Plan:  #1.  GERD.  She is able to control her symptoms well with dietary measures with sporadic need for famotidine.  #2.  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  LFTs were normal at the time of office visit on 02/01/2021.  She is due for blood work with Dr. Lemmie Wood at the time of next office visit in few weeks.  #3.  IBS.  Once again she is doing well with sporadic need for Imodium.  Wood-sided abdominal pain possibly due to IBS but may be referred pain.  It is sporadic and she will monitor the symptom and let us know.    Follow Up Instructions:  Patient have LFTs an CBC with her next blood work by Dr. Lemmie Wood.    I discussed the assessment and treatment plan with the patient. The patient was provided an opportunity to ask questions and all were answered. The patient agreed with the plan and demonstrated an understanding of the instructions.   The patient was advised to call back or seek an in-person evaluation if the symptoms worsen or if the condition fails to improve as anticipated.  I provided  11 minutes of non-face-to-face time during this encounter.   Hildred Laser, MD

## 2021-06-07 ENCOUNTER — Ambulatory Visit (INDEPENDENT_AMBULATORY_CARE_PROVIDER_SITE_OTHER): Payer: PPO | Admitting: Internal Medicine

## 2021-06-10 ENCOUNTER — Ambulatory Visit (HOSPITAL_COMMUNITY): Payer: PPO

## 2021-06-22 ENCOUNTER — Other Ambulatory Visit: Payer: Self-pay

## 2021-06-22 ENCOUNTER — Ambulatory Visit (HOSPITAL_COMMUNITY)
Admission: RE | Admit: 2021-06-22 | Discharge: 2021-06-22 | Disposition: A | Discharge status: Home / Self Care | Payer: PPO | Source: Ambulatory Visit | Attending: Family Medicine | Admitting: Family Medicine

## 2021-06-22 DIAGNOSIS — Z1231 Encounter for screening mammogram for malignant neoplasm of breast: Secondary | ICD-10-CM | POA: Insufficient documentation

## 2021-07-06 ENCOUNTER — Other Ambulatory Visit: Payer: Self-pay | Admitting: Urology

## 2021-07-07 ENCOUNTER — Telehealth: Payer: Self-pay

## 2021-07-07 MED ORDER — METHENAMINE HIPPURATE 1 G PO TABS: 1.0000 g | ORAL_TABLET | Freq: Two times a day (BID) | ORAL | 11 refills | Status: DC

## 2021-07-07 NOTE — Telephone Encounter (Signed)
Refill submitted. 

## 2021-07-07 NOTE — Telephone Encounter (Signed)
Patient called advising she needs a refill on mediation.  Medication: methenamine (HIPREX) 1 g tablet  Pharmacy:  [facility]

## 2021-08-03 ENCOUNTER — Telehealth (INDEPENDENT_AMBULATORY_CARE_PROVIDER_SITE_OTHER): Payer: Self-pay | Admitting: *Deleted

## 2021-08-03 NOTE — Telephone Encounter (Signed)
The other week patient saw a speck of blood when wiping but states she has hemorrhoids. Always on sundays and mondays she takes a flintstone vit with iron. But noticed dark stools today. Took imodium on Sunday because she had a loose stool. Discomfort in lower part of abdomen. States she normally has some in left side and it feels the same. Discomfort is relieved after having a BM. No fever. Eating/drinking normal. No nausea, no vomiting. Would like to see if she can pick up hemocult card to see if she has blood in her stool.  ?Last seen by dr Laural Golden on 12/27 and has upcoming appt in April.  ?

## 2021-08-03 NOTE — Telephone Encounter (Signed)
Sure, she can pick a hemoccult card ?

## 2021-08-04 NOTE — Telephone Encounter (Signed)
Patient picked up hemoccult card. Await return and will test.  ?

## 2021-08-04 NOTE — Telephone Encounter (Signed)
Thanks, this is reassuring ?

## 2021-08-04 NOTE — Telephone Encounter (Signed)
? ?  Diagnosis: dark stools ? ? ?Result(s) ? ? Card 1: Negative:  ?  ?  ? ? ?Completed by: Marisa Cyphers LPN ? ? ?HEMOCCULT SENSA DEVELOPER: ?LOT#: [id number]  EXPIRATION DATE: 6/24 ? ? ?HEMOCCULT SENSA CARD: ?LOT#: [id number]  EXPIRATION DATE: 2/24 ? ? ? ? ? ?ADDITIONAL COMMENTS:  patient was called and notified hemoccult card was negative. Patient verbalized understanding.  ?

## 2021-09-06 ENCOUNTER — Ambulatory Visit (INDEPENDENT_AMBULATORY_CARE_PROVIDER_SITE_OTHER): Payer: PPO | Admitting: Internal Medicine

## 2021-09-06 ENCOUNTER — Encounter (INDEPENDENT_AMBULATORY_CARE_PROVIDER_SITE_OTHER): Payer: Self-pay | Admitting: Internal Medicine

## 2021-09-06 VITALS — BP 123/63 | HR 82 | Temp 98.5°F | Ht 64.0 in | Wt 159.8 lb

## 2021-09-06 DIAGNOSIS — K716 Toxic liver disease with hepatitis, not elsewhere classified: Secondary | ICD-10-CM

## 2021-09-06 DIAGNOSIS — T50905A Adverse effect of unspecified drugs, medicaments and biological substances, initial encounter: Secondary | ICD-10-CM

## 2021-09-06 DIAGNOSIS — K219 Gastro-esophageal reflux disease without esophagitis: Secondary | ICD-10-CM | POA: Diagnosis not present

## 2021-09-06 DIAGNOSIS — K580 Irritable bowel syndrome with diarrhea: Secondary | ICD-10-CM | POA: Diagnosis not present

## 2021-09-06 NOTE — Patient Instructions (Signed)
Physician will call with results of blood work. 

## 2021-09-06 NOTE — Progress Notes (Signed)
Presenting complaint; ? ?Follow-up for chronic GERD and IBS. ?History of drug-induced hepatic injury. ? ?Database and subjective: ? ?Patient is 87 year old Caucasian female who is here for scheduled visit accompanied by her daughter [name]. ?She was last seen in December 2022. ?She has history of GERD IBS and history of iron deficiency anemia responding to p.o. iron. ?She was hospitalized in August 2022 for acute liver injury which was felt to be secondary to Macrodantin. ?Transaminases returned to normal in September 2022. ?Patient was supposed to have follow-up LFTs with Dr. Karie Kirks but she says she has not had any since her last visit. ? ?She feels she is doing well.  She rarely has heartburn.  She has occasional dysphagia which has not gotten any worse.  She has not had any episode of food impaction.  Stool frequency ranges between 0 and 3/day.  Consistency varies from hard to loose stool.  Stool consistency can change with same defecation.  She denies melena or rectal bleeding.  She has intermittent LLQ pain relieved with defecation.  She is using Imodium no more than once a week.  She is using alprazolam at bedtime and experiencing no side effects.  Last Prolia dose was 6 months ago. ?She has gained 2 pounds since her last visit. ? ?Current Medications: ?Outpatient Encounter Medications as of 09/06/2021  ?Medication Sig  ? ALPRAZolam (XANAX) 0.5 MG tablet Take 0.5 mg by mouth at bedtime as needed for sleep or anxiety. Sleep  ? atorvastatin (LIPITOR) 20 MG tablet Take 20 mg by mouth daily.  ? bifidobacterium infantis (ALIGN) capsule Take 1 capsule by mouth daily.  ? Calcium Carbonate (CALCIUM 600 PO) Take 1 capsule by mouth daily.  ? ELDERBERRY PO Take 1 capsule by mouth daily.  ? famotidine (PEPCID) 20 MG tablet Take 20 mg by mouth 2 (two) times daily.  ? FLUTICASONE PROPIONATE, NASAL, NA Place 1 spray into the nose daily as needed (rhinitis).  ? ipratropium (ATROVENT) 0.06 % nasal spray Place 2 sprays into  both nostrils 2 (two) times daily as needed for rhinitis.  ? loperamide (IMODIUM) 2 MG capsule Take 2 mg by mouth daily as needed for diarrhea or loose stools.  ? loratadine (CLARITIN) 10 MG tablet Take 10 mg by mouth daily.  ? methenamine (HIPREX) 1 g tablet Take 1 tablet (1 g total) by mouth 2 (two) times daily.  ? Pediatric Multivitamins-Iron (FLINTSTONES PLUS IRON) chewable tablet Chew 1 tablet by mouth 2 (two) times a week.  ? PROLIA 60 MG/ML SOSY injection SMARTSIG:SUB-Q Twice a Year  ? vitamin B-12 (CYANOCOBALAMIN) 1000 MCG tablet Take 1,000 mcg by mouth daily.  ? Vitamin D, Ergocalciferol, (DRISDOL) 50000 UNITS CAPS capsule Take 50,000 Units by mouth every 7 (seven) days. Takes on Saturdays.  ? [DISCONTINUED] methenamine (HIPREX) 1 g tablet TAKE (1) TABLET BY MOUTH TWICE DAILY.  ? estradiol (ESTRACE) 0.1 MG/GM vaginal cream Apply a pea sized amount (0.5 grams) vaginally at bedtime daily for 2 weeks and then 2 times weekly. (Patient not taking: Reported on 09/06/2021)  ? ?No facility-administered encounter medications on file as of 09/06/2021.  ? ? ? ?Objective: ?Blood pressure 123/63, pulse 82, temperature 98.5 ?F (36.9 ?C), temperature source Oral, height $RemoveBefo'5\' 4"'[id number]$  (1.626 m), weight 159 lb 12.8 oz (72.5 kg). ?Patient is alert and in no acute distress. ?Conjunctiva is pink. Sclera is nonicteric ?Oropharyngeal mucosa is normal. ?No neck masses or thyromegaly noted. ?Cardiac exam with regular rhythm normal S1 and S2. No murmur or gallop noted. ?Lungs  are clear to auscultation. ?Abdomen is symmetrical soft and nontender with organomegaly or masses. ?No LE edema or clubbing noted. ? ?Labs/studies Results: ? ? ? ?  Latest Ref Rng & Units 02/02/2021  ? 10:41 AM 01/11/2021  ? 12:39 PM 01/10/2021  ? 10:55 AM  ?CBC  ?WBC 3.8 - 10.8 Thousand/uL 6.5   7.5   6.3    ?Hemoglobin 11.7 - 15.5 g/dL 13.4   12.0   11.5    ?Hematocrit 35.0 - 45.0 % 41.4   37.1   36.0    ?Platelets 140 - 400 Thousand/uL 206   186   155    ?  ? ?  Latest  Ref Rng & Units 02/02/2021  ? 10:41 AM 01/14/2021  ? 10:33 AM 01/11/2021  ?  6:49 AM  ?CMP  ?Glucose 65 - 99 mg/dL 93    118    ?BUN 7 - 25 mg/dL 17    10    ?Creatinine 0.60 - 0.95 mg/dL 1.18    0.70    ?Sodium 135 - 146 mmol/L 137    138    ?Potassium 3.5 - 5.3 mmol/L 4.8    3.6    ?Chloride 98 - 110 mmol/L 100    105    ?CO2 20 - 32 mmol/L 30    26    ?Calcium 8.6 - 10.4 mg/dL 10.1    8.0    ?Total Protein 6.1 - 8.1 g/dL 7.3   6.4   5.3    ?Total Bilirubin 0.2 - 1.2 mg/dL 0.7   0.9   1.8    ?Alkaline Phos 38 - 126 U/L   301    ?AST 10 - 35 U/L 23   49   155    ?ALT 6 - 29 U/L 16   90   171    ?  ? ?  Latest Ref Rng & Units 02/02/2021  ? 10:41 AM 01/14/2021  ? 10:33 AM 01/11/2021  ?  6:49 AM  ?Hepatic Function  ?Total Protein 6.1 - 8.1 g/dL 7.3   6.4   5.3    ?Albumin 3.5 - 5.0 g/dL   2.5    ?AST 10 - 35 U/L 23   49   155    ?ALT 6 - 29 U/L 16   90   171    ?Alk Phosphatase 38 - 126 U/L   301    ?Total Bilirubin 0.2 - 1.2 mg/dL 0.7   0.9   1.8    ?Bilirubin, Direct 0.0 - 0.2 mg/dL  0.4     ?  ? ? ?Assessment: ? ?#1.  Chronic GERD.  She is doing well with antireflux measures and famotidine. ? ?#2.  History of IBS with diarrhea.  Her symptoms have mellowed over the years.  She is taking loperamide no more than once a week for diarrhea. ? ?#3.  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  Transaminases have returned to normal 1 month after she stopped the medication which was in September 2022.  She needs a follow-up LFTs.  Patient request follow-up with Dr. Abbey Chatters who saw her when she was hospitalized for drug-induced hepatitis. ? ?#4.  Esophageal dysphagia.  She is having sporadic symptom.  Suspect she has esophageal motility disorder.  If dysphagia worsens would consider barium pill esophagogram. ? ? ?Plan: ? ?Continue famotidine at a dose of 20 mg by mouth twice daily. ?Continue loperamide 2 mg daily as needed. ?Patient will go  to the lab for CBC and LFTs. ?Office visit with Dr. Abbey Chatters in 6 months  since I am retiring end of June 2023. ? ? ? ? ? ? ?

## 2021-09-10 LAB — CBC WITH DIFFERENTIAL/PLATELET
Absolute Monocytes: 382 cells/uL (ref 200–950)
Basophils Absolute: 62 cells/uL (ref 0–200)
Basophils Relative: 0.8 %
Eosinophils Absolute: 117 cells/uL (ref 15–500)
Eosinophils Relative: 1.5 %
HCT: 39.4 % (ref 35.0–45.0)
Hemoglobin: 12.9 g/dL (ref 11.7–15.5)
Lymphs Abs: 2933 cells/uL (ref 850–3900)
MCH: 28 pg (ref 27.0–33.0)
MCHC: 32.7 g/dL (ref 32.0–36.0)
MCV: 85.5 fL (ref 80.0–100.0)
MPV: 11.6 fL (ref 7.5–12.5)
Monocytes Relative: 4.9 %
Neutro Abs: 4306 cells/uL (ref 1500–7800)
Neutrophils Relative %: 55.2 %
Platelets: 185 10*3/uL (ref 140–400)
RBC: 4.61 10*6/uL (ref 3.80–5.10)
RDW: 13.6 % (ref 11.0–15.0)
Total Lymphocyte: 37.6 %
WBC: 7.8 10*3/uL (ref 3.8–10.8)

## 2021-09-10 LAB — HEPATIC FUNCTION PANEL
AG Ratio: 1.4 (calc) (ref 1.0–2.5)
ALT: 13 U/L (ref 6–29)
AST: 20 U/L (ref 10–35)
Albumin: 3.9 g/dL (ref 3.6–5.1)
Alkaline phosphatase (APISO): 72 U/L (ref 37–153)
Bilirubin, Direct: 0.2 mg/dL (ref 0.0–0.2)
Globulin: 2.8 g/dL (calc) (ref 1.9–3.7)
Indirect Bilirubin: 0.5 mg/dL (calc) (ref 0.2–1.2)
Total Bilirubin: 0.7 mg/dL (ref 0.2–1.2)
Total Protein: 6.7 g/dL (ref 6.1–8.1)

## 2021-09-12 ENCOUNTER — Telehealth (INDEPENDENT_AMBULATORY_CARE_PROVIDER_SITE_OTHER): Payer: Self-pay | Admitting: *Deleted

## 2021-09-12 ENCOUNTER — Telehealth: Payer: Self-pay | Admitting: Internal Medicine

## 2021-09-12 NOTE — Telephone Encounter (Signed)
Okay to schedule follow up with me in 6 months. thanks ?

## 2021-09-12 NOTE — Telephone Encounter (Signed)
noted 

## 2021-09-12 NOTE — Telephone Encounter (Signed)
Patient was seen by Dr Laural Golden on 09/06/21 for a follow up - he would like patient to be seen in 6 mths, however since he is retiring patient requested her apt be made with Dr Abbey Chatters, she saw him when she was inpatient and feels she knows him. I spoke to [place] and she said if Dr Abbey Chatters is ok having patient switch to him, patient can be made an apt at [facility] ?

## 2021-09-12 NOTE — Telephone Encounter (Signed)
Patient was seen by Dr Laural Golden on 09/06/21 for a follow up - he would like patient to be seen in 6 mths, however since he is retiring patient requested her apt be made with Dr Abbey Chatters, she saw him when she was inpatient and feels she knows him. I spoke to [place] and she said if Dr Abbey Chatters is ok having patient switch to him, patient can be made an apt at [facility]. (PER note from Lacretia Nicks at [facility]. Can I add patient to your 6 month recall? ) ?

## 2021-09-13 NOTE — Telephone Encounter (Signed)
Added to recall

## 2021-09-13 NOTE — Telephone Encounter (Signed)
noted 

## 2021-10-04 ENCOUNTER — Ambulatory Visit: Payer: PPO | Admitting: Urology

## 2022-01-10 ENCOUNTER — Encounter (INDEPENDENT_AMBULATORY_CARE_PROVIDER_SITE_OTHER): Payer: Self-pay | Admitting: Gastroenterology

## 2022-01-10 ENCOUNTER — Ambulatory Visit (INDEPENDENT_AMBULATORY_CARE_PROVIDER_SITE_OTHER): Payer: PPO | Admitting: Gastroenterology

## 2022-01-10 ENCOUNTER — Ambulatory Visit (HOSPITAL_COMMUNITY)
Admission: RE | Admit: 2022-01-10 | Discharge: 2022-01-10 | Disposition: A | Discharge status: Home / Self Care | Payer: PPO | Source: Ambulatory Visit | Attending: Gastroenterology | Admitting: Gastroenterology

## 2022-01-10 VITALS — BP 117/77 | HR 86 | Temp 98.1°F | Ht 64.0 in | Wt 157.4 lb

## 2022-01-10 DIAGNOSIS — R1030 Lower abdominal pain, unspecified: Secondary | ICD-10-CM | POA: Insufficient documentation

## 2022-01-10 DIAGNOSIS — R195 Other fecal abnormalities: Secondary | ICD-10-CM | POA: Insufficient documentation

## 2022-01-10 DIAGNOSIS — K625 Hemorrhage of anus and rectum: Secondary | ICD-10-CM | POA: Diagnosis not present

## 2022-01-10 LAB — POCT I-STAT CREATININE: Creatinine, Ser: 1.2 mg/dL — ABNORMAL HIGH (ref 0.44–1.00)

## 2022-01-10 MED ORDER — IOHEXOL 9 MG/ML PO SOLN
ORAL | Status: AC
  Filled 2022-01-10: qty 1000

## 2022-01-10 MED ORDER — IOHEXOL 300 MG/ML  SOLN
100.0000 mL | Freq: Once | INTRAMUSCULAR | Status: AC | PRN
  Administered 2022-01-10: 100 mL via INTRAVENOUS

## 2022-01-10 NOTE — Progress Notes (Signed)
Referring Provider: [name], MD Primary Care Physician:  [name], MD Primary GI Physician: Jenetta Downer  Chief Complaint  Patient presents with   Blood In Stools    Noticed blood in stool 3 days ago. Was having lower abdominal pain and went to have a BM and it has mucus and blood. Saw some more blood in stool yesterday.    HPI:   Stephanie Wood is a 87 y.o. female with past medical history of GERD, IBS, IDA, anxiety, depression, colonic adenoma   Patient presenting today for rectal bleeding/mucus in stools.  Patient last seen in April 2023, doing well at that time with rare dysphagia, stools ranging from hard to loose with intermittent LLQ pain improved with defecation, using imodium maybe once per week.   Patient reports that on Saturday around 1230 pm she began having severe lower abdominal pain (mid) and broke out in a sweat. She had a BM and states that pain improved thereafter. Later that night she had another BM with some blood tinged mucus and another BM with mucus on Sunday morning. Sunday evening she noted some more blood tinged mucus. She endorses an occasional LLQ pain, this has been a chronic issue. She is now having lower, mid abdominal that occurs when she goes to have a BM, this improves after defecating, she denies fevers. Pain is much less severe than when it initially began. She took 1/2 tablet of imodium yesterday and again today for looser stools but this is also a chronic issue. Stools have been yellow in color for many years. She denies any nausea or vomiting. Denies heavy rectal bleeding or melena.  She states she felt in her normal state of health prior to this occurring. Denies new medications or abx.   Last Colonoscopy:2015 Examination performed to cecum. No evidence of recurrent polyps. Single small diverticulum at sigmoid colon. Small external hemorrhoids and anal papillae  Last Endoscopy:2013 Mild changes of reflux esophagitis limited to GE  junction. Moderate size sliding hiatal hernia with abnormal appearance to mucosa and focal gastritis. Single erosion below the level of hiatus and antral scar. Normal bulbar and post bulbar mucosa.    Recommendations:    Past Medical History:  Diagnosis Date   Acid reflux disease    Anemia    Anxiety    Colonic adenoma    Depression    Irritable bowel syndrome     Past Surgical History:  Procedure Laterality Date   CATARACT EXTRACTION     COLONOSCOPY N/A 04/15/2014   Procedure: COLONOSCOPY;  Surgeon: Rogene Houston, MD;  Location: [facility];  Service: Endoscopy;  Laterality: N/A;  730   ESOPHAGOGASTRODUODENOSCOPY  09/20/2011   Procedure: ESOPHAGOGASTRODUODENOSCOPY (EGD);  Surgeon: Rogene Houston, MD;  Location: [facility];  Service: Endoscopy;  Laterality: N/A;  215   EYE SURGERY     bil catatract surgery    Current Outpatient Medications  Medication Sig Dispense Refill   ALPRAZolam (XANAX) 0.5 MG tablet Take 0.5 mg by mouth at bedtime as needed for sleep or anxiety. Sleep     atorvastatin (LIPITOR) 20 MG tablet Take 20 mg by mouth daily.     bifidobacterium infantis (ALIGN) capsule Take 1 capsule by mouth daily.     Calcium Carbonate (CALCIUM 600 PO) Take 1 capsule by mouth daily.     ELDERBERRY PO Take 1 capsule by mouth daily.     estradiol (ESTRACE) 0.1 MG/GM vaginal cream Apply a pea sized amount (0.5 grams)  vaginally at bedtime daily for 2 weeks and then 2 times weekly. (Patient not taking: Reported on 09/06/2021) 42.5 g 3   famotidine (PEPCID) 20 MG tablet Take 20 mg by mouth 2 (two) times daily.     FLUTICASONE PROPIONATE, NASAL, NA Place 1 spray into the nose daily as needed (rhinitis).     ipratropium (ATROVENT) 0.06 % nasal spray Place 2 sprays into both nostrils 2 (two) times daily as needed for rhinitis.     loperamide (IMODIUM) 2 MG capsule Take 2 mg by mouth daily as needed for diarrhea or loose stools. 30 capsule    loratadine (CLARITIN) 10 MG tablet  Take 10 mg by mouth daily.     methenamine (HIPREX) 1 g tablet Take 1 tablet (1 g total) by mouth 2 (two) times daily. 60 tablet 11   Pediatric Multivitamins-Iron (FLINTSTONES PLUS IRON) chewable tablet Chew 1 tablet by mouth 2 (two) times a week.     PROLIA 60 MG/ML SOSY injection SMARTSIG:SUB-Q Twice a Year     vitamin B-12 (CYANOCOBALAMIN) 1000 MCG tablet Take 1,000 mcg by mouth daily.     Vitamin D, Ergocalciferol, (DRISDOL) 50000 UNITS CAPS capsule Take 50,000 Units by mouth every 7 (seven) days. Takes on Saturdays.     No current facility-administered medications for this visit.    Allergies as of 01/10/2022 - Review Complete 01/10/2022  Allergen Reaction Noted   Drug class [trazodone and nefazodone]  06/29/2020   Macrobid [nitrofurantoin] Other (See Comments) 01/11/2021    Family History  Problem Relation Age of Onset   Prostate cancer Father     Social History   Socioeconomic History   Marital status: Widowed    Spouse name: Not on file   Number of children: 1   Years of education: Not on file   Highest education level: Not on file  Occupational History   Occupation: reitred  Tobacco Use   Smoking status: Never    Passive exposure: Never   Smokeless tobacco: Never  Vaping Use   Vaping Use: Never used  Substance and Sexual Activity   Alcohol use: No    Alcohol/week: 0.0 standard drinks of alcohol   Drug use: No   Sexual activity: Not on file  Other Topics Concern   Not on file  Social History Narrative   Not on file   Social Determinants of Health   Financial Resource Strain: Not on file  Food Insecurity: Not on file  Transportation Needs: Not on file  Physical Activity: Not on file  Stress: Not on file  Social Connections: Not on file   Review of systems General: negative for malaise, night sweats, fever, chills, weight loss Neck: Negative for lumps, goiter, pain and significant neck swelling Resp: Negative for cough, wheezing, dyspnea at rest CV:  Negative for chest pain, leg swelling, palpitations, orthopnea GI: denies melena, hematochezia, nausea, vomiting, constipation, dysphagia, odyonophagia, early satiety or unintentional weight loss. +lower abd pain +blood tinged mucus in stools +loose stools MSK: Negative for joint pain or swelling, back pain, and muscle pain. Derm: Negative for itching or rash Psych: Denies depression, anxiety, memory loss, confusion. No homicidal or suicidal ideation.  Heme: Negative for prolonged bleeding, bruising easily, and swollen nodes. Endocrine: Negative for cold or heat intolerance, polyuria, polydipsia and goiter. Neuro: negative for tremor, gait imbalance, syncope and seizures. The remainder of the review of systems is noncontributory.  Physical Exam: There were no vitals taken for this visit. General:   Alert and oriented.  No distress noted. Pleasant and cooperative.  Head:  Normocephalic and atraumatic. Eyes:  Conjuctiva clear without scleral icterus. Mouth:  Oral mucosa pink and moist. Good dentition. No lesions. Heart: Normal rate and rhythm, s1 and s2 heart sounds present.  Lungs: Clear lung sounds in all lobes. Respirations equal and unlabored. Abdomen:  +BS, soft, and non-distended. Mild TTP mid to left lower quadrant. No rebound or guarding. No HSM or masses noted. Derm: No palmar erythema or jaundice Msk:  Symmetrical without gross deformities. Normal posture. Extremities:  Without edema. Neurologic:  Alert and  oriented x4 Psych:  Alert and cooperative. Normal mood and affect.  Invalid input(s): "6 MONTHS"   ASSESSMENT: Stephanie Wood is a 87 y.o. female presenting today with lower abdominal pain and blood tinged mucus in stools.  Patient with history of IBS with chronic, intermittent LLQ pain and intermittent looser stools with acute onset of central, lower abdominal pain and blood tinged mucus in stools starting Saturday. Denies overt rectal bleeding or melena. No nausea, vomiting,  or fevers. Pain has lessened since initial onset and seems to improve after defecation. She denies any new medications or abx. Stools remain intermittently loose as they are at baseline. No hx of diverticulitis. Last TCS in 2015 with single diverticulum in sigmoid. Symptoms likely secondary to known IBS, however, cannot rule out diverticulitis, less likely infectious etiology given lack of watery diarrhea or change in bowel habits, viral gastroenteritis also a differential. Will proceed with CT A/P with contrast to rule out acute diverticulitis. Considers stool studies if profuse diarrhea develops. She will make me aware of overt rectal bleeding, fevers, or worsening of abdominal pain. May consider colonoscopy if pain persists and CT is unremarkable.    PLAN:  CT A/P w contrast stat 2. Consider stool studies if diarrhea 3 or more per day 3. Pt to make me aware of fevers, severe abd pain or heavy rectal bleeding 4. Consider colonoscopy if pain persists and CT unremarkable.   All questions were answered, patient verbalized understanding and is in agreement with plan as outlined above.   Follow Up: Has f/u in Oct  Ogechi Kuehnel L. Alver Sorrow, MSN, APRN, AGNP-C Adult-Gerontology Nurse Practitioner [facility]

## 2022-01-10 NOTE — Patient Instructions (Signed)
We will get you scheduled for a CT scan to further evaluate your symptoms If you find that you are having more diarrhea, 3 or more episodes per day, please let me know and we will also test stools for any bacteria/infection

## 2022-03-08 ENCOUNTER — Telehealth: Payer: Self-pay

## 2022-03-08 NOTE — Telephone Encounter (Signed)
Patient called and advised they needed a refill on medication below. She advised she needed the rx sent in a 90 day supply due to it being cheaper with her insurance.   Medication: methenamine (HIPREX) 1 g tablet  Pharmacy: [facility], [place]  Thank you

## 2022-03-09 ENCOUNTER — Other Ambulatory Visit: Payer: Self-pay

## 2022-03-09 MED ORDER — METHENAMINE HIPPURATE 1 G PO TABS: 1.0000 g | ORAL_TABLET | Freq: Two times a day (BID) | ORAL | 8 refills | Status: DC

## 2022-03-09 NOTE — Progress Notes (Signed)
Patient requested 90 supply of Hiprex be sent to pharmacy.  Updated rx and sent to pharmacy with refills.

## 2022-03-11 LAB — LAB REPORT - SCANNED: EGFR: 47

## 2022-03-13 ENCOUNTER — Ambulatory Visit (INDEPENDENT_AMBULATORY_CARE_PROVIDER_SITE_OTHER): Payer: PPO | Admitting: Gastroenterology

## 2022-03-13 ENCOUNTER — Encounter (INDEPENDENT_AMBULATORY_CARE_PROVIDER_SITE_OTHER): Payer: Self-pay | Admitting: Gastroenterology

## 2022-03-13 ENCOUNTER — Encounter (INDEPENDENT_AMBULATORY_CARE_PROVIDER_SITE_OTHER): Payer: Self-pay

## 2022-03-13 ENCOUNTER — Other Ambulatory Visit (INDEPENDENT_AMBULATORY_CARE_PROVIDER_SITE_OTHER): Payer: Self-pay

## 2022-03-13 VITALS — BP 143/83 | HR 99 | Temp 97.6°F | Ht 64.0 in | Wt 159.5 lb

## 2022-03-13 DIAGNOSIS — K580 Irritable bowel syndrome with diarrhea: Secondary | ICD-10-CM

## 2022-03-13 DIAGNOSIS — R1110 Vomiting, unspecified: Secondary | ICD-10-CM | POA: Diagnosis not present

## 2022-03-13 MED ORDER — OMEPRAZOLE 40 MG PO CPDR: 40.0000 mg | DELAYED_RELEASE_CAPSULE | Freq: Every day | ORAL | 3 refills | Status: DC

## 2022-03-13 NOTE — Progress Notes (Unsigned)
Maylon Peppers, M.D. Gastroenterology & Hepatology [facility] [address] [place], [place] [address]  Primary Care Physician: [name], MD [place] [id number]  I will communicate my assessment and recommendations to the referring MD via EMR.  Problems: IBS-D Chronic regurgitation  History of Present Illness: Stephanie Wood is a 87 y.o. 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  The patient was last seen on 01/10/2022. At that time, the patient was presenting increased amount of mucus in stools.  CT of the abdomen pelvis with IV contrast was ordered which showed large hiatal hernia. Her symptoms improved so no further workup.  Patient reports that she has a longstanding history of diarrhea which has a "sand like appearance". She takes Imodium as needed for it, which she only takes when she is coming out of her house. If she does not take Imodium, she would have 3 Bms but has urgency. She has had 2 fecal accidents in her life.She also feels some abdominal tightness in her upper abdomen.  Only had one episode of blood in stool in the past, and this is the reason why she had an abdominal CT on 01/10/2022.  These images showed presence of a large hiatal hernia and right inguinal hernia. Also had some significant abdominal pain in the lower abdomen when she had this episode. This episode was self limited.  The patient also reports having regurgitation of mucus and some of her food after eating. She states the regurgitation happens often but not ever day. She takes Pepcid twice a day and denies having heartburn.   The patient denies having any nausea, vomiting, fever, chills, hematochezia, melena, hematemesis, abdominal distention, abdominal pain, diarrhea, jaundice, pruritus or  weight loss.  Had a esophagram in 2020:  Very large hiatal hernia with greater than 50% of stomach in the lower chest.  Moderate diffuse esophageal dysmotility with prolonged thoracic retention of contrast. No laryngeal penetration or aspiration. No esophageal mass or stricture.  Last EGD: 2013 Mild changes of reflux esophagitis limited to GE junction. Moderate size sliding hiatal hernia with abnormal appearance to mucosa and focal gastritis. Single erosion below the level of hiatus and antral scar. Normal bulbar and post bulbar mucosa.  Last Colonoscopy: 2015 Examination performed to cecum. No evidence of recurrent polyps. Single small diverticulum at sigmoid colon. Small external hemorrhoids and anal papillae.  Past Medical History: Past Medical History:  Diagnosis Date   Acid reflux disease    Anemia    Anxiety    Colonic adenoma    Depression    Irritable bowel syndrome     Past Surgical History: Past Surgical History:  Procedure Laterality Date   CATARACT EXTRACTION     COLONOSCOPY N/A 04/15/2014   Procedure: COLONOSCOPY;  Surgeon: Rogene Houston, MD;  [facility];  Service: Endoscopy;  Laterality: N/A;  730   ESOPHAGOGASTRODUODENOSCOPY  09/20/2011   Procedure: ESOPHAGOGASTRODUODENOSCOPY (EGD);  Surgeon: Rogene Houston, MD;  [facility];  Service: Endoscopy;  Laterality: N/A;  215   EYE SURGERY     bil catatract surgery    Family History: Family History  Problem Relation Age of Onset   Prostate cancer Father     Social History: Social History   Tobacco Use  Smoking Status Never   Passive exposure: Never  Smokeless Tobacco  Never   Social History   Substance and Sexual Activity  Alcohol Use No   Alcohol/week: 0.0 standard drinks of alcohol   Social History   Substance and Sexual Activity  Drug Use No    Allergies: Allergies  Allergen Reactions   Drug Class [Trazodone And Nefazodone]    Macrobid [Nitrofurantoin] Other  (See Comments)    Elevated LFT's    Medications: Current Outpatient Medications  Medication Sig Dispense Refill   ALPRAZolam (XANAX) 0.5 MG tablet Take 0.5 mg by mouth at bedtime as needed for sleep or anxiety. Sleep     atorvastatin (LIPITOR) 20 MG tablet Take 20 mg by mouth daily.     bifidobacterium infantis (ALIGN) capsule Take 1 capsule by mouth daily.     Calcium Carbonate (CALCIUM 600 PO) Take 1 capsule by mouth daily.     ELDERBERRY PO Take 1 capsule by mouth daily.     famotidine (PEPCID) 20 MG tablet Take 20 mg by mouth 2 (two) times daily.     loperamide (IMODIUM) 2 MG capsule Take 2 mg by mouth daily as needed for diarrhea or loose stools. 30 capsule    loratadine (CLARITIN) 10 MG tablet Take 10 mg by mouth daily.     methenamine (HIPREX) 1 g tablet Take 1 tablet (1 g total) by mouth 2 (two) times daily. 90 tablet 8   OVER THE COUNTER MEDICATION Bifidobacterium infantis one capsule daily.     Pediatric Multivitamins-Iron (FLINTSTONES PLUS IRON) chewable tablet Chew 1 tablet by mouth 2 (two) times a week.     PROLIA 60 MG/ML SOSY injection SMARTSIG:SUB-Q Twice a Year     vitamin B-12 (CYANOCOBALAMIN) 1000 MCG tablet Take 1,000 mcg by mouth daily.     Vitamin D, Ergocalciferol, (DRISDOL) 50000 UNITS CAPS capsule Take 50,000 Units by mouth every 7 (seven) days. Takes on Saturdays.     No current facility-administered medications for this visit.    Review of Systems: GENERAL: negative for malaise, night sweats HEENT: No changes in hearing or vision, no nose bleeds or other nasal problems. NECK: Negative for lumps, goiter, pain and significant neck swelling RESPIRATORY: Negative for cough, wheezing CARDIOVASCULAR: Negative for chest pain, leg swelling, palpitations, orthopnea GI: SEE HPI MUSCULOSKELETAL: Negative for joint pain or swelling, back pain, and muscle pain. SKIN: Negative for lesions, rash PSYCH: Negative for sleep disturbance, mood disorder and recent psychosocial  stressors. HEMATOLOGY Negative for prolonged bleeding, bruising easily, and swollen nodes. ENDOCRINE: Negative for cold or heat intolerance, polyuria, polydipsia and goiter. NEURO: negative for tremor, gait imbalance, syncope and seizures. The remainder of the review of systems is noncontributory.   Physical Exam: BP (!) 143/83 (BP Location: Left Arm, Patient Position: Sitting, Cuff Size: Large)   Pulse 99   Temp 97.6 F (36.4 C) (Oral)   Ht '5\' 4"'$  (1.626 m)   Wt 159 lb 8 oz (72.3 kg)   BMI 27.38 kg/m  GENERAL: The patient is AO x3, in no acute distress. HEENT: Head is normocephalic and atraumatic. EOMI are intact. Mouth is well hydrated and without lesions. NECK: Supple. No masses LUNGS: Clear to auscultation. No presence of rhonchi/wheezing/rales. Adequate chest expansion HEART: RRR, normal s1 and s2. ABDOMEN: Soft, nontender, no guarding, no peritoneal signs, and nondistended. BS +. No masses. EXTREMITIES: Without any cyanosis, clubbing, rash, lesions or edema. NEUROLOGIC: AOx3, no focal motor deficit. SKIN: no jaundice, no rashes  Imaging/Labs: as above  I personally reviewed and interpreted the available labs, imaging and endoscopic  files.  Impression and Plan: Stephanie Wood is a 87 y.o. 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  Regarding her IBS-D, her symptoms have been relatively well controlled and she is having very few episodes of diarrhea which have been managed with Imodium as needed.  She can continue with this medication for now.  As she has presented and regurgitation, she may have better control her symptoms with the use of omeprazole as Pepcid is clearly not controlling her symptoms.  I explained that she is predisposed to have these episodes due to the presence of a large hiatal hernia that may ultimately need to be  corrected if her regurgitation is not controlled with maximum dose of PPIs.  - Schedule EGD - Stop Pepcid - Start omeprazole 40 mg qday  All questions were answered.      Maylon Peppers, MD Gastroenterology and Hepatology [facility]

## 2022-03-13 NOTE — Patient Instructions (Signed)
Schedule EGD Stop Pepcid Start omeprazole 40 mg qday

## 2022-03-13 NOTE — H&P (View-Only) (Signed)
Stephanie Wood, M.D. Gastroenterology & Hepatology [facility] [address] [place], [place] [address]  Primary Care Physician: [name], MD [place] [id number]  I will communicate my assessment and recommendations to the referring MD via EMR.  Problems: IBS-D Chronic regurgitation  History of Present Illness: Stephanie Wood is a 87 y.o. 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  The patient was last seen on 01/10/2022. At that time, the patient was presenting increased amount of mucus in stools.  CT of the abdomen pelvis with IV contrast was ordered which showed large hiatal hernia. Her symptoms improved so no further workup.  Patient reports that she has a longstanding history of diarrhea which has a "sand like appearance". She takes Imodium as needed for it, which she only takes when she is coming out of her house. If she does not take Imodium, she would have 3 Bms but has urgency. She has had 2 fecal accidents in her life.She also feels some abdominal tightness in her upper abdomen.  Only had one episode of blood in stool in the past, and this is the reason why she had an abdominal CT on 01/10/2022.  These images showed presence of a large hiatal hernia and right inguinal hernia. Also had some significant abdominal pain in the lower abdomen when she had this episode. This episode was self limited.  The patient also reports having regurgitation of mucus and some of her food after eating. She states the regurgitation happens often but not ever day. She takes Pepcid twice a day and denies having heartburn.   The patient denies having any nausea, vomiting, fever, chills, hematochezia, melena, hematemesis, abdominal distention, abdominal pain, diarrhea, jaundice, pruritus or  weight loss.  Had a esophagram in 2020:  Very large hiatal hernia with greater than 50% of stomach in the lower chest.  Moderate diffuse esophageal dysmotility with prolonged thoracic retention of contrast. No laryngeal penetration or aspiration. No esophageal mass or stricture.  Last EGD: 2013 Mild changes of reflux esophagitis limited to GE junction. Moderate size sliding hiatal hernia with abnormal appearance to mucosa and focal gastritis. Single erosion below the level of hiatus and antral scar. Normal bulbar and post bulbar mucosa.  Last Colonoscopy: 2015 Examination performed to cecum. No evidence of recurrent polyps. Single small diverticulum at sigmoid colon. Small external hemorrhoids and anal papillae.  Past Medical History: Past Medical History:  Diagnosis Date   Acid reflux disease    Anemia    Anxiety    Colonic adenoma    Depression    Irritable bowel syndrome     Past Surgical History: Past Surgical History:  Procedure Laterality Date   CATARACT EXTRACTION     COLONOSCOPY N/A 04/15/2014   Procedure: COLONOSCOPY;  Surgeon: Rogene Houston, MD;  [facility];  Service: Endoscopy;  Laterality: N/A;  730   ESOPHAGOGASTRODUODENOSCOPY  09/20/2011   Procedure: ESOPHAGOGASTRODUODENOSCOPY (EGD);  Surgeon: Rogene Houston, MD;  [facility];  Service: Endoscopy;  Laterality: N/A;  215   EYE SURGERY     bil catatract surgery    Family History: Family History  Problem Relation Age of Onset   Prostate cancer Father     Social History: Social History   Tobacco Use  Smoking Status Never   Passive exposure: Never  Smokeless Tobacco  Never   Social History   Substance and Sexual Activity  Alcohol Use No   Alcohol/week: 0.0 standard drinks of alcohol   Social History   Substance and Sexual Activity  Drug Use No    Allergies: Allergies  Allergen Reactions   Drug Class [Trazodone And Nefazodone]    Macrobid [Nitrofurantoin] Other  (See Comments)    Elevated LFT's    Medications: Current Outpatient Medications  Medication Sig Dispense Refill   ALPRAZolam (XANAX) 0.5 MG tablet Take 0.5 mg by mouth at bedtime as needed for sleep or anxiety. Sleep     atorvastatin (LIPITOR) 20 MG tablet Take 20 mg by mouth daily.     bifidobacterium infantis (ALIGN) capsule Take 1 capsule by mouth daily.     Calcium Carbonate (CALCIUM 600 PO) Take 1 capsule by mouth daily.     ELDERBERRY PO Take 1 capsule by mouth daily.     famotidine (PEPCID) 20 MG tablet Take 20 mg by mouth 2 (two) times daily.     loperamide (IMODIUM) 2 MG capsule Take 2 mg by mouth daily as needed for diarrhea or loose stools. 30 capsule    loratadine (CLARITIN) 10 MG tablet Take 10 mg by mouth daily.     methenamine (HIPREX) 1 g tablet Take 1 tablet (1 g total) by mouth 2 (two) times daily. 90 tablet 8   OVER THE COUNTER MEDICATION Bifidobacterium infantis one capsule daily.     Pediatric Multivitamins-Iron (FLINTSTONES PLUS IRON) chewable tablet Chew 1 tablet by mouth 2 (two) times a week.     PROLIA 60 MG/ML SOSY injection SMARTSIG:SUB-Q Twice a Year     vitamin B-12 (CYANOCOBALAMIN) 1000 MCG tablet Take 1,000 mcg by mouth daily.     Vitamin D, Ergocalciferol, (DRISDOL) 50000 UNITS CAPS capsule Take 50,000 Units by mouth every 7 (seven) days. Takes on Saturdays.     No current facility-administered medications for this visit.    Review of Systems: GENERAL: negative for malaise, night sweats HEENT: No changes in hearing or vision, no nose bleeds or other nasal problems. NECK: Negative for lumps, goiter, pain and significant neck swelling RESPIRATORY: Negative for cough, wheezing CARDIOVASCULAR: Negative for chest pain, leg swelling, palpitations, orthopnea GI: SEE HPI MUSCULOSKELETAL: Negative for joint pain or swelling, back pain, and muscle pain. SKIN: Negative for lesions, rash PSYCH: Negative for sleep disturbance, mood disorder and recent psychosocial  stressors. HEMATOLOGY Negative for prolonged bleeding, bruising easily, and swollen nodes. ENDOCRINE: Negative for cold or heat intolerance, polyuria, polydipsia and goiter. NEURO: negative for tremor, gait imbalance, syncope and seizures. The remainder of the review of systems is noncontributory.   Physical Exam: BP (!) 143/83 (BP Location: Left Arm, Patient Position: Sitting, Cuff Size: Large)   Pulse 99   Temp 97.6 F (36.4 C) (Oral)   Ht '5\' 4"'$  (1.626 m)   Wt 159 lb 8 oz (72.3 kg)   BMI 27.38 kg/m  GENERAL: The patient is AO x3, in no acute distress. HEENT: Head is normocephalic and atraumatic. EOMI are intact. Mouth is well hydrated and without lesions. NECK: Supple. No masses LUNGS: Clear to auscultation. No presence of rhonchi/wheezing/rales. Adequate chest expansion HEART: RRR, normal s1 and s2. ABDOMEN: Soft, nontender, no guarding, no peritoneal signs, and nondistended. BS +. No masses. EXTREMITIES: Without any cyanosis, clubbing, rash, lesions or edema. NEUROLOGIC: AOx3, no focal motor deficit. SKIN: no jaundice, no rashes  Imaging/Labs: as above  I personally reviewed and interpreted the available labs, imaging and endoscopic  files.  Impression and Plan: TYNA HUERTAS is a 87 y.o. 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  Regarding her IBS-D, her symptoms have been relatively well controlled and she is having very few episodes of diarrhea which have been managed with Imodium as needed.  She can continue with this medication for now.  As she has presented and regurgitation, she may have better control her symptoms with the use of omeprazole as Pepcid is clearly not controlling her symptoms.  I explained that she is predisposed to have these episodes due to the presence of a large hiatal hernia that may ultimately need to be  corrected if her regurgitation is not controlled with maximum dose of PPIs.  - Schedule EGD - Stop Pepcid - Start omeprazole 40 mg qday  All questions were answered.      Stephanie Peppers, MD Gastroenterology and Hepatology [facility]

## 2022-03-15 ENCOUNTER — Encounter (HOSPITAL_COMMUNITY)
Admission: RE | Admit: 2022-03-15 | Discharge: 2022-03-15 | Disposition: A | Discharge status: Home / Self Care | Payer: PPO | Source: Ambulatory Visit | Attending: Gastroenterology | Admitting: Gastroenterology

## 2022-03-16 ENCOUNTER — Encounter (HOSPITAL_COMMUNITY)
Admission: RE | Disposition: A | Discharge status: Home / Self Care | Payer: Self-pay | Source: Home / Self Care | Attending: Gastroenterology

## 2022-03-16 ENCOUNTER — Encounter (HOSPITAL_COMMUNITY): Payer: Self-pay | Admitting: Gastroenterology

## 2022-03-16 ENCOUNTER — Other Ambulatory Visit: Payer: Self-pay

## 2022-03-16 ENCOUNTER — Ambulatory Visit (HOSPITAL_BASED_OUTPATIENT_CLINIC_OR_DEPARTMENT_OTHER): Payer: PPO | Admitting: Anesthesiology

## 2022-03-16 ENCOUNTER — Ambulatory Visit (HOSPITAL_COMMUNITY): Payer: PPO | Admitting: Anesthesiology

## 2022-03-16 ENCOUNTER — Ambulatory Visit (HOSPITAL_COMMUNITY)
Admission: RE | Admit: 2022-03-16 | Discharge: 2022-03-16 | Disposition: A | Discharge status: Home / Self Care | Payer: PPO | Attending: Gastroenterology | Admitting: Gastroenterology

## 2022-03-16 DIAGNOSIS — R1110 Vomiting, unspecified: Secondary | ICD-10-CM | POA: Diagnosis present

## 2022-03-16 DIAGNOSIS — K449 Diaphragmatic hernia without obstruction or gangrene: Secondary | ICD-10-CM | POA: Diagnosis not present

## 2022-03-16 DIAGNOSIS — F419 Anxiety disorder, unspecified: Secondary | ICD-10-CM | POA: Insufficient documentation

## 2022-03-16 DIAGNOSIS — K219 Gastro-esophageal reflux disease without esophagitis: Secondary | ICD-10-CM | POA: Insufficient documentation

## 2022-03-16 DIAGNOSIS — F32A Depression, unspecified: Secondary | ICD-10-CM | POA: Diagnosis not present

## 2022-03-16 DIAGNOSIS — K580 Irritable bowel syndrome with diarrhea: Secondary | ICD-10-CM | POA: Insufficient documentation

## 2022-03-16 HISTORY — PX: ESOPHAGOGASTRODUODENOSCOPY (EGD) WITH PROPOFOL: SHX5813

## 2022-03-16 SURGERY — ESOPHAGOGASTRODUODENOSCOPY (EGD) WITH PROPOFOL
Anesthesia: General

## 2022-03-16 MED ORDER — PROPOFOL 10 MG/ML IV BOLUS
INTRAVENOUS | Status: DC | PRN
  Administered 2022-03-16: 50 mg via INTRAVENOUS
  Administered 2022-03-16: 90 mg via INTRAVENOUS

## 2022-03-16 MED ORDER — OMEPRAZOLE 40 MG PO CPDR: 40.0000 mg | DELAYED_RELEASE_CAPSULE | Freq: Every day | ORAL | 3 refills | Status: DC

## 2022-03-16 MED ORDER — LACTATED RINGERS IV SOLN: INTRAVENOUS | Status: DC

## 2022-03-16 MED ORDER — LIDOCAINE HCL 1 % IJ SOLN
INTRAMUSCULAR | Status: DC | PRN
  Administered 2022-03-16: 50 mg via INTRADERMAL

## 2022-03-16 NOTE — Anesthesia Postprocedure Evaluation (Signed)
Anesthesia Post Note  Patient: Stephanie Wood  Procedure(s) Performed: ESOPHAGOGASTRODUODENOSCOPY (EGD) WITH PROPOFOL  Patient location during evaluation: PACU Anesthesia Type: General Level of consciousness: awake and alert Pain management: pain level controlled Vital Signs Assessment: post-procedure vital signs reviewed and stable Respiratory status: spontaneous breathing Cardiovascular status: blood pressure returned to baseline and stable Postop Assessment: no apparent nausea or vomiting Anesthetic complications: no   No notable events documented.   Last Vitals:  Vitals:   03/16/22 1104 03/16/22 1255  BP: (!) 155/73 (!) 111/53  Pulse: 92 80  Resp: 20 (!) 25  Temp: 36.9 C (!) 36.4 C  SpO2: 96% 95%    Last Pain:  Vitals:   03/16/22 1255  TempSrc: Oral  PainSc: 0-No pain                 Lillias Difrancesco

## 2022-03-16 NOTE — Transfer of Care (Addendum)
Immediate Anesthesia Transfer of Care Note  Patient: Stephanie Wood  Procedure(s) Performed: ESOPHAGOGASTRODUODENOSCOPY (EGD) WITH PROPOFOL  Patient Location: Short Stay  Anesthesia Type:General  Level of Consciousness: awake  Airway & Oxygen Therapy: Patient Spontanous Breathing  Post-op Assessment: Report given to RN  Post vital signs: Reviewed and stable  Last Vitals:  Vitals Value Taken Time  BP 111/53   Temp 36.4   Pulse 80   Resp 25   SpO2 95     Last Pain:  Vitals:   03/16/22 1238  TempSrc:   PainSc: 0-No pain      Patients Stated Pain Goal: 10 (00/86/76 1950)  Complications: No notable events documented.

## 2022-03-16 NOTE — Interval H&P Note (Signed)
History and Physical Interval Note:  03/16/2022 10:58 AM  [name]  has presented today for surgery, with the diagnosis of Regurgitiation.  The various methods of treatment have been discussed with the patient and family. After consideration of risks, benefits and other options for treatment, the patient has consented to  Procedure(s) with comments: ESOPHAGOGASTRODUODENOSCOPY (EGD) WITH PROPOFOL (N/A) - 1230 ASA 3 as a surgical intervention.  The patient's history has been reviewed, patient examined, no change in status, stable for surgery.  I have reviewed the patient's chart and labs.  Questions were answered to the patient's satisfaction.     Stephanie Wood

## 2022-03-16 NOTE — Anesthesia Preprocedure Evaluation (Signed)
Anesthesia Evaluation  Patient identified by MRN, date of birth, ID band Patient awake    Reviewed: Allergy & Precautions, H&P , NPO status , Patient's Chart, lab work & pertinent test results, reviewed documented beta blocker date and time   Airway Mallampati: II  TM Distance: >3 FB Neck ROM: full    Dental no notable dental hx.    Pulmonary neg pulmonary ROS,    Pulmonary exam normal breath sounds clear to auscultation       Cardiovascular Exercise Tolerance: Good negative cardio ROS   Rhythm:regular Rate:Normal     Neuro/Psych PSYCHIATRIC DISORDERS Anxiety Depression negative neurological ROS     GI/Hepatic Neg liver ROS, GERD  Medicated,  Endo/Other  negative endocrine ROS  Renal/GU negative Renal ROS  negative genitourinary   Musculoskeletal   Abdominal   Peds  Hematology  (+) Blood dyscrasia, anemia ,   Anesthesia Other Findings   Reproductive/Obstetrics negative OB ROS                             Anesthesia Physical Anesthesia Plan  ASA: 2  Anesthesia Plan: General   Post-op Pain Management:    Induction:   PONV Risk Score and Plan: Propofol infusion  Airway Management Planned:   Additional Equipment:   Intra-op Plan:   Post-operative Plan:   Informed Consent: I have reviewed the patients History and Physical, chart, labs and discussed the procedure including the risks, benefits and alternatives for the proposed anesthesia with the patient or authorized representative who has indicated his/her understanding and acceptance.     Dental Advisory Given  Plan Discussed with: CRNA  Anesthesia Plan Comments:         Anesthesia Quick Evaluation  

## 2022-03-16 NOTE — Addendum Note (Signed)
Addendum  created 03/16/22 1425 by Ollen Bowl, CRNA   Clinical Note Signed

## 2022-03-16 NOTE — Discharge Instructions (Signed)
You are being discharged to home.  Resume your previous diet.  Continue your present medications.  

## 2022-03-16 NOTE — Op Note (Signed)
[facility] Patient Name: Stephanie Wood Procedure Date: 03/16/2022 11:51 AM MRN: [id number] Date of Birth: August 17, 1934 Attending MD: Maylon Peppers ,  CSN: [id number] Age: 87 Admit Type: Outpatient Procedure:                Upper GI endoscopy Indications:              Regurgitation Providers:                Maylon Peppers, Janeece Riggers, RN, Everardo Pacific Referring MD:              Medicines:                Monitored Anesthesia Care Complications:            No immediate complications. Estimated Blood Loss:     Estimated blood loss: none. Procedure:                Pre-Anesthesia Assessment:                           - Prior to the procedure, a History and Physical                            was performed, and patient medications, allergies                            and sensitivities were reviewed. The patient's                            tolerance of previous anesthesia was reviewed.                           - The risks and benefits of the procedure and the                            sedation options and risks were discussed with the                            patient. All questions were answered and informed                            consent was obtained.                           - ASA Grade Assessment: III - A patient with severe                            systemic disease.                           After obtaining informed consent, the endoscope was                            passed under direct vision. Throughout the                            procedure, the patient's blood pressure, pulse, and  oxygen saturations were monitored continuously. The                            [id number]-[id number] ([id number]) scope was introduced through the                            mouth, and advanced to the second part of duodenum.                            The upper GI endoscopy was accomplished without                            difficulty. The patient tolerated the  procedure                            well. Scope In: 12:43:35 PM Scope Out: 94:76:54 PM Total Procedure Duration: 0 hours 4 minutes 33 seconds  Findings:      A 6 cm hiatal hernia was found. The hiatal narrowing was 36 cm from the       incisors. The Z-line was 30 cm from the incisors.      The stomach was normal.      The examined duodenum was normal. Impression:               - 6 cm hiatal hernia.                           - Normal stomach.                           - Normal examined duodenum.                           - No specimens collected. Moderate Sedation:      Per Anesthesia Care Recommendation:           - Discharge patient to home (ambulatory).                           - Resume previous diet.                           - Continue present medications. Procedure Code(s):        --- Professional ---                           [phone], Esophagogastroduodenoscopy, flexible,                            transoral; diagnostic, including collection of                            specimen(s) by brushing or washing, when performed                            (separate procedure) Diagnosis Code(s):        --- Professional ---  K44.9, Diaphragmatic hernia without obstruction or                            gangrene                           R11.10, Vomiting, unspecified CPT copyright 2019 [employer]. All rights reserved. The codes documented in this report are preliminary and upon coder review may  be revised to meet current compliance requirements. Maylon Peppers, MD Maylon Peppers,  03/16/2022 12:53:52 PM This report has been signed electronically. Number of Addenda: 0

## 2022-03-21 ENCOUNTER — Encounter (HOSPITAL_COMMUNITY): Payer: Self-pay | Admitting: Gastroenterology

## 2022-04-19 ENCOUNTER — Other Ambulatory Visit (INDEPENDENT_AMBULATORY_CARE_PROVIDER_SITE_OTHER): Payer: Self-pay | Admitting: Gastroenterology

## 2022-04-19 ENCOUNTER — Telehealth (INDEPENDENT_AMBULATORY_CARE_PROVIDER_SITE_OTHER): Payer: Self-pay

## 2022-04-19 DIAGNOSIS — R1110 Vomiting, unspecified: Secondary | ICD-10-CM

## 2022-04-19 MED ORDER — OMEPRAZOLE 40 MG PO CPDR: 40.0000 mg | DELAYED_RELEASE_CAPSULE | Freq: Every day | ORAL | 3 refills | Status: DC

## 2022-04-19 NOTE — Telephone Encounter (Signed)
I called and left a detailed message that per Dr. Jenetta Downer he only wanted her on once per day dosing, if she thought she would need bid she would need to come in for an office visit.

## 2022-04-19 NOTE — Telephone Encounter (Signed)
Patient called and says her Omeprazole was increased at the EGD to two per day. (I did not see anything mentioning that it was needing to be increased). Patient needs new rx sent to [facility]. Please advise.

## 2022-04-19 NOTE — Telephone Encounter (Signed)
I will send a refill for once a day dosing as this was the last thing discussed after her EGD, it was not increased to twice a day. She should take it once a day. If she wants to have a higher dose, should come to the office so we can discuss this. For now should continue once a day

## 2022-04-19 NOTE — Telephone Encounter (Signed)
Patient made aware of all.  

## 2022-05-08 ENCOUNTER — Telehealth: Payer: Self-pay | Admitting: Internal Medicine

## 2022-05-08 ENCOUNTER — Encounter: Payer: Self-pay | Admitting: *Deleted

## 2022-05-08 ENCOUNTER — Encounter: Payer: Self-pay | Admitting: Internal Medicine

## 2022-05-08 ENCOUNTER — Ambulatory Visit: Payer: PPO | Attending: Internal Medicine | Admitting: Internal Medicine

## 2022-05-08 VITALS — BP 140/80 | HR 74 | Ht 62.0 in | Wt 154.2 lb

## 2022-05-08 DIAGNOSIS — Z8249 Family history of ischemic heart disease and other diseases of the circulatory system: Secondary | ICD-10-CM

## 2022-05-08 DIAGNOSIS — M7989 Other specified soft tissue disorders: Secondary | ICD-10-CM | POA: Insufficient documentation

## 2022-05-08 DIAGNOSIS — R079 Chest pain, unspecified: Secondary | ICD-10-CM | POA: Insufficient documentation

## 2022-05-08 NOTE — Progress Notes (Signed)
  History of Present Illness: 87 year old female presents for annual follow-up.  She has a history of recurrent urinary tract infections.  She is maintained on methenamine twice a day.  She rarely uses estrogen cream-last time she thinks was about 3 to 4 months ago.  No recent urinary tract infections.  No intolerance to her medical therapy.  Past Medical History:  Diagnosis Date   Acid reflux disease    Anemia    Anxiety    Colonic adenoma    Depression    Irritable bowel syndrome     Past Surgical History:  Procedure Laterality Date   CATARACT EXTRACTION     COLONOSCOPY N/A 04/15/2014   Procedure: COLONOSCOPY;  Surgeon: Rogene Houston, MD;  [facility];  Service: Endoscopy;  Laterality: N/A;  730   ESOPHAGOGASTRODUODENOSCOPY  09/20/2011   Procedure: ESOPHAGOGASTRODUODENOSCOPY (EGD);  Surgeon: Rogene Houston, MD;  [facility];  Service: Endoscopy;  Laterality: N/A;  215   ESOPHAGOGASTRODUODENOSCOPY (EGD) WITH PROPOFOL N/A 03/16/2022   Procedure: ESOPHAGOGASTRODUODENOSCOPY (EGD) WITH PROPOFOL;  Surgeon: Harvel Quale, MD;  [facility];  Service: Gastroenterology;  Laterality: N/A;  1230 ASA 3   EYE SURGERY     bil catatract surgery    Home Medications:  (Not in a hospital admission)   Allergies:  Allergies  Allergen Reactions   Drug Class [Trazodone And Nefazodone]    Macrobid [Nitrofurantoin] Other (See Comments)    Elevated LFT's    Family History  Problem Relation Age of Onset   Prostate cancer Father     Social History:  reports that she has never smoked. She has never been exposed to tobacco smoke. She has never used smokeless tobacco. She reports that she does not drink alcohol and does not use drugs.  ROS: A complete review of systems was performed.  All systems are negative except for pertinent findings as noted.  Physical Exam:  Vital signs in last 24 hours: '@VSRANGES'$ @ General:  Alert and oriented, No acute  distress HEENT: Normocephalic, atraumatic Neck: No JVD or lymphadenopathy Cardiovascular: Regular rate  Lungs: Normal inspiratory/expiratory excursion Neurologic: Grossly intact  I have reviewed prior pt notes  I have reviewed urinalysis results  I have reviewed prior urine culture   Impression/Assessment:  1.  History of recurrent urinary tract infections, none recently on suppression with methenamine  2.  Postmenopausal atrophic vaginal changes, not on regular use of estrogen cream  Plan:  1.  I did recommend she use estrogen cream at least 1 night a week  2.  Continue methenamine  3.  I will have her come back in 1 year for routine check  Jorja Loa 05/08/2022, 9:05 AM  [name]. Brendaliz Kuk MD

## 2022-05-08 NOTE — Patient Instructions (Addendum)
Medication Instructions:   Your physician recommends that you continue on your current medications as directed. Please refer to the Current Medication list given to you today.  Labwork: none  Testing/Procedures: Your physician has requested that you have an echocardiogram. Echocardiography is a painless test that uses sound waves to create images of your heart. It provides your doctor with information about the size and shape of your heart and how well your heart's chambers and valves are working. This procedure takes approximately one hour. There are no restrictions for this procedure. Please do NOT wear cologne, perfume, aftershave, or lotions (deodorant is allowed). Please arrive 15 minutes prior to your appointment time. Your physician has requested that you have a lexiscan myoview. For further information please visit [email or web address]. Please follow instruction sheet, as given.  Follow-Up: Your physician recommends that you schedule a follow-up appointment in: 1 year. You will receive a reminder call in the mail in about 10 months reminding you to call and schedule your appointment. If you don't receive this call, please contact our office.  Any Other Special Instructions Will Be Listed Below (If Applicable).  If you need a refill on your cardiac medications before your next appointment, please call your pharmacy.

## 2022-05-08 NOTE — Telephone Encounter (Signed)
Checking percert on the following patient for testing scheduled at [facility].   LEXISCAN 05/18/2022

## 2022-05-08 NOTE — Progress Notes (Signed)
Cardiology Office Note  Date: 05/08/2022   ID: Stephanie Wood, DOB Nov 18, 1934, MRN [id number]  PCP:  [name], MD  Cardiologist:  None Electrophysiologist:  None   Reason for Office Visit: To evaluate for CAD at the request of Dr. Karie Kirks   History of Present Illness: Stephanie Wood is a 87 y.o. female known to have HTN, HLD, IBS, chronic regurgitation from hiatal hernia was referred to cardiology clinic for evaluation of CAD the request of Dr. Karie Kirks.  Patient has a family history of ASCVD (but not premature). Her brother had open heart surgery, triple vessel bypass in his late 85s and eventually passed away.  Her older brother had heart attack in his 85s. She hence wanted to check her heart out.  She reported having substernal chest tightness lasting for a few seconds last year which resolved spontaneously. Denied any more angina the last 1 year.  Walks with a cane and has no DOE. Reported having exertional dizziness and also accounts that partly from not drinking enough water.  Denied any syncope, palpitations but has some occasional LE swelling for which she used compression socks and the swelling resolved. Denies smoking cigarettes, alcohol use and illicit drug abuse.  She was prescribed furosemide by PCP but she did not take any of it.  She does not check her blood pressures at home and is not currently on any blood pressure medications.  Past Medical History:  Diagnosis Date   Acid reflux disease    Anemia    Anxiety    Colonic adenoma    Depression    Irritable bowel syndrome     Past Surgical History:  Procedure Laterality Date   CATARACT EXTRACTION     COLONOSCOPY N/A 04/15/2014   Procedure: COLONOSCOPY;  Surgeon: Rogene Houston, MD;  [facility];  Service: Endoscopy;  Laterality: N/A;  730   ESOPHAGOGASTRODUODENOSCOPY  09/20/2011   Procedure: ESOPHAGOGASTRODUODENOSCOPY (EGD);  Surgeon: Rogene Houston, MD;  [facility];  Service:  Endoscopy;  Laterality: N/A;  215   ESOPHAGOGASTRODUODENOSCOPY (EGD) WITH PROPOFOL N/A 03/16/2022   Procedure: ESOPHAGOGASTRODUODENOSCOPY (EGD) WITH PROPOFOL;  Surgeon: Harvel Quale, MD;  [facility];  Service: Gastroenterology;  Laterality: N/A;  1230 ASA 3   EYE SURGERY     bil catatract surgery    Current Outpatient Medications  Medication Sig Dispense Refill   ALPRAZolam (XANAX) 0.5 MG tablet Take 0.5 mg by mouth at bedtime.     atorvastatin (LIPITOR) 20 MG tablet Take 20 mg by mouth daily.     bifidobacterium infantis (ALIGN) capsule Take 1 capsule by mouth daily.     Calcium Carbonate (CALCIUM 600 PO) Take 600 mg by mouth daily.     ELDERBERRY PO Take 1 capsule by mouth daily.     loperamide (IMODIUM) 2 MG capsule Take 2 mg by mouth as needed for diarrhea or loose stools. 30 capsule    loratadine (CLARITIN) 10 MG tablet Take 10 mg by mouth daily.     methenamine (HIPREX) 1 g tablet Take 1 tablet (1 g total) by mouth 2 (two) times daily. 90 tablet 8   omeprazole (PRILOSEC) 40 MG capsule Take 1 capsule (40 mg total) by mouth daily. 90 capsule 3   Pediatric Multivitamins-Iron (FLINTSTONES PLUS IRON) chewable tablet Chew 1 tablet by mouth 2 (two) times a week.     PROLIA 60 MG/ML SOSY injection Inject 60 mg into the skin every 6 (six) months.  vitamin B-12 (CYANOCOBALAMIN) 1000 MCG tablet Take 1,000 mcg by mouth daily.     Vitamin D, Ergocalciferol, (DRISDOL) 50000 UNITS CAPS capsule Take 50,000 Units by mouth every 7 (seven) days.     No current facility-administered medications for this visit.   Allergies:  Drug class [trazodone and nefazodone] and Macrobid [nitrofurantoin]   Social History: The patient  reports that she has never smoked. She has never been exposed to tobacco smoke. She has never used smokeless tobacco. She reports that she does not drink alcohol and does not use drugs.   Family History: The patient's family history includes Prostate  cancer in her father.   ROS:  Please see the history of present illness. Otherwise, complete review of systems is positive for none.  All other systems are reviewed and negative.   Physical Exam: VS:  There were no vitals taken for this visit., BMI There is no height or weight on file to calculate BMI.  Wt Readings from Last 3 Encounters:  03/13/22 159 lb 8 oz (72.3 kg)  01/10/22 157 lb 6.4 oz (71.4 kg)  09/06/21 159 lb 12.8 oz (72.5 kg)    General: Patient appears comfortable at rest. HEENT: Conjunctiva and lids normal, oropharynx clear with moist mucosa. Neck: Supple, no elevated JVP or carotid bruits, no thyromegaly. Lungs: Clear to auscultation, nonlabored breathing at rest. Cardiac: Regular rate and rhythm, no S3 or significant systolic murmur, no pericardial rub. Abdomen: Soft, nontender, no hepatomegaly, bowel sounds present, no guarding or rebound. Extremities: No pitting edema, distal pulses 2+. Skin: Warm and dry. Musculoskeletal: No kyphosis. Neuropsychiatric: Alert and oriented x3, affect grossly appropriate.  ECG:    Recent Labwork: 09/09/2021: ALT 13; AST 20; Hemoglobin 12.9; Platelets 185 01/10/2022: Creatinine, Ser 1.20  No results found for: "CHOL", "TRIG", "HDL", "CHOLHDL", "VLDL", "LDLCALC", "LDLDIRECT"  Other Studies Reviewed Today: I personally reviewed the heart care referral records from the PCP  Assessment and Plan: Patient is 87 year old F known to have HTN, HLD, chronic regurgitation from hiatal hernia was referred to cardiology clinic for evaluation of CAD.  # Chest pain # Family history of ASCVD (not premature) # Feet swelling -Patient had 1 episode of substernal chest tightness lasting for a few seconds 1 year ago.  She had her brother diagnosed with CAD and underwent triple-vessel bypass in his late 60s. I will obtain Lexiscan to rule out CAD and also 2D echocardiogram to rule out any significant valve abnormalities.  I have spent a total of 45  minutes with patient reviewing chart, EKGs, labs and examining patient as well as establishing an assessment and plan that was discussed with the patient.  > 50% of time was spent in direct patient care.      Medication Adjustments/Labs and Tests Ordered: Current medicines are reviewed at length with the patient today.  Concerns regarding medicines are outlined above.   Tests Ordered: No orders of the defined types were placed in this encounter.   Medication Changes: No orders of the defined types were placed in this encounter.   Disposition:  Follow up 1 year  Signed Bryttani Blew Fidel Levy, MD, 05/08/2022 9:52 AM    [facility] [place], [place], [place] [address]

## 2022-05-09 ENCOUNTER — Ambulatory Visit: Payer: PPO | Admitting: Urology

## 2022-05-09 ENCOUNTER — Encounter: Payer: Self-pay | Admitting: Urology

## 2022-05-09 VITALS — BP 150/84 | HR 93 | Ht 62.0 in | Wt 154.0 lb

## 2022-05-09 DIAGNOSIS — N952 Postmenopausal atrophic vaginitis: Secondary | ICD-10-CM

## 2022-05-09 DIAGNOSIS — Z87440 Personal history of urinary (tract) infections: Secondary | ICD-10-CM | POA: Diagnosis not present

## 2022-05-09 DIAGNOSIS — N390 Urinary tract infection, site not specified: Secondary | ICD-10-CM

## 2022-05-09 LAB — URINALYSIS, ROUTINE W REFLEX MICROSCOPIC
Bilirubin, UA: NEGATIVE
Glucose, UA: NEGATIVE
Ketones, UA: NEGATIVE
Leukocytes,UA: NEGATIVE
Nitrite, UA: NEGATIVE
Protein,UA: NEGATIVE
RBC, UA: NEGATIVE
Specific Gravity, UA: <1.005 — ABNORMAL LOW (ref 1.005–1.030)
Urobilinogen, Ur: 0.2 mg/dL (ref 0.2–1.0)
pH, UA: 5.5 (ref 5.0–7.5)

## 2022-05-16 ENCOUNTER — Ambulatory Visit: Payer: PPO | Attending: Internal Medicine

## 2022-05-16 DIAGNOSIS — M7989 Other specified soft tissue disorders: Secondary | ICD-10-CM

## 2022-05-16 DIAGNOSIS — R600 Localized edema: Secondary | ICD-10-CM | POA: Diagnosis not present

## 2022-05-17 LAB — ECHOCARDIOGRAM COMPLETE
AR max vel: 1.72 cm2
AV Area VTI: 1.89 cm2
AV Area mean vel: 1.7 cm2
AV Mean grad: 3.6 mmHg
AV Peak grad: 6.6 mmHg
Ao pk vel: 1.28 m/s
Area-P 1/2: 4.29 cm2
Calc EF: 62.9 %
MV M vel: 4.43 m/s
MV Peak grad: 78.5 mmHg
S' Lateral: 2.2 cm
Single Plane A2C EF: 63.1 %
Single Plane A4C EF: 66.5 %

## 2022-05-18 ENCOUNTER — Encounter (HOSPITAL_COMMUNITY): Payer: PPO

## 2022-05-18 ENCOUNTER — Other Ambulatory Visit (HOSPITAL_COMMUNITY): Payer: PPO

## 2022-05-23 ENCOUNTER — Telehealth: Payer: Self-pay

## 2022-05-23 NOTE — Telephone Encounter (Signed)
-----   Message from Chalmers Guest, MD sent at 05/23/2022 11:38 AM EST ----- Normal pumping function of the heart and mild MR. Will monitor progression of MR with echocardiogram every 3 years.

## 2022-05-23 NOTE — Telephone Encounter (Signed)
Patient notified and verbalized understanding. Patient had no questions or concerns at this time. PCP copied 

## 2022-05-26 ENCOUNTER — Telehealth: Payer: Self-pay | Admitting: Internal Medicine

## 2022-05-26 NOTE — Telephone Encounter (Signed)
Pt would like a call back to see if she is okay to take a shingles shot since she has a test on 01/08. Please advise.

## 2022-05-26 NOTE — Telephone Encounter (Signed)
Advised that a shingles vaccine would not prevent her from having a stress test.  Verbalized understanding.

## 2022-06-02 ENCOUNTER — Telehealth (INDEPENDENT_AMBULATORY_CARE_PROVIDER_SITE_OTHER): Payer: Self-pay | Admitting: *Deleted

## 2022-06-02 NOTE — Telephone Encounter (Signed)
I did advise patient to call pcp today since no provider available right now and we are leaving office early today. Advised if having severe pain, sob, dizziness, lightheadedness then go to ED. She verbalized understanding

## 2022-06-02 NOTE — Telephone Encounter (Signed)
Patient called and reports she had an episode last night like she did back in August. Was seen 8/15 and had ct scan done. She reports she was having abdominal pain and sweating and it eased up after having BM. She is still having some abdominal discomfort today across lower abdomen. No fever, no nausea, no diarrhea. When wiping today she is seeing some bright red blood and mucus on tissue. She is eating and drinking fine. No dizziness, no sob.   [phone]

## 2022-06-05 ENCOUNTER — Ambulatory Visit (HOSPITAL_COMMUNITY)
Admission: RE | Admit: 2022-06-05 | Discharge: 2022-06-05 | Disposition: A | Discharge status: Home / Self Care | Payer: PPO | Source: Ambulatory Visit | Attending: Internal Medicine | Admitting: Internal Medicine

## 2022-06-05 DIAGNOSIS — Z8249 Family history of ischemic heart disease and other diseases of the circulatory system: Secondary | ICD-10-CM | POA: Insufficient documentation

## 2022-06-05 DIAGNOSIS — R079 Chest pain, unspecified: Secondary | ICD-10-CM | POA: Diagnosis not present

## 2022-06-05 MED ORDER — TECHNETIUM TC 99M TETROFOSMIN IV KIT
10.0000 | PACK | Freq: Once | INTRAVENOUS | Status: AC | PRN
  Administered 2022-06-05: 9.6 via INTRAVENOUS

## 2022-06-05 MED ORDER — SODIUM CHLORIDE FLUSH 0.9 % IV SOLN
INTRAVENOUS | Status: AC
  Administered 2022-06-05: 10 mL via INTRAVENOUS
  Filled 2022-06-05: qty 10

## 2022-06-05 MED ORDER — REGADENOSON 0.4 MG/5ML IV SOLN
INTRAVENOUS | Status: AC
  Administered 2022-06-05: 0.4 mg via INTRAVENOUS
  Filled 2022-06-05: qty 5

## 2022-06-05 MED ORDER — TECHNETIUM TC 99M TETROFOSMIN IV KIT
30.0000 | PACK | Freq: Once | INTRAVENOUS | Status: AC | PRN
  Administered 2022-06-05: 30 via INTRAVENOUS

## 2022-06-05 NOTE — Telephone Encounter (Signed)
Patient called today to report she did not call Dr. Karie Kirks last Friday. She reports she has not seen any blood in stool since Friday. She did have some mucus on Friday or Saturday and had some diarrhea but imodium helped with diarrhea. She is not having abdominal pain but reports a raw feeling in abdomen.   [phone]

## 2022-06-06 LAB — NM MYOCAR MULTI W/SPECT W/WALL MOTION / EF
LV dias vol: 52 mL (ref 46–106)
LV sys vol: 7 mL
Nuc Stress EF: 86 %
Peak HR: 115 {beats}/min
RATE: 0.4
Rest HR: 80 {beats}/min
Rest Nuclear Isotope Dose: 9.6 mCi
SDS: 2
SRS: 1
SSS: 3
ST Depression (mm): 0 mm
Stress Nuclear Isotope Dose: 30 mCi
TID: 0.97

## 2022-06-07 ENCOUNTER — Telehealth: Payer: Self-pay | Admitting: *Deleted

## 2022-06-07 NOTE — Telephone Encounter (Signed)
Discussed with patient per chelsea -  Noted, she can let us know if she has new or worsening symptoms and continue imodium as needed  Patient verbalized understanding.

## 2022-06-07 NOTE — Telephone Encounter (Signed)
Patient is returning nurse call, please call [phone]

## 2022-06-07 NOTE — Telephone Encounter (Signed)
Left message to return call 

## 2022-06-07 NOTE — Telephone Encounter (Signed)
Patient called back today and reports she has not seen any more bleeding. Patient reports her stool looks like it has brown sand in it about once or twice a week for months. No abdominal pain, no fever. Reports she will having two regular stools and usually the third one is loose. Takes imodium if going out of the house.

## 2022-06-12 ENCOUNTER — Telehealth: Payer: Self-pay | Admitting: Internal Medicine

## 2022-06-12 NOTE — Telephone Encounter (Signed)
Mallipeddi, Vishnu P, MD   Normal stress test.   Patient notified,copied pcp

## 2022-06-12 NOTE — Telephone Encounter (Signed)
Pt is requesting call back in regards to results.

## 2022-07-05 ENCOUNTER — Other Ambulatory Visit (HOSPITAL_COMMUNITY): Payer: Self-pay | Admitting: Family Medicine

## 2022-07-05 DIAGNOSIS — Z1231 Encounter for screening mammogram for malignant neoplasm of breast: Secondary | ICD-10-CM

## 2022-07-08 LAB — LAB REPORT - SCANNED: EGFR: 51

## 2022-07-24 ENCOUNTER — Encounter (HOSPITAL_COMMUNITY): Payer: Self-pay

## 2022-07-24 ENCOUNTER — Ambulatory Visit (HOSPITAL_COMMUNITY)
Admission: RE | Admit: 2022-07-24 | Discharge: 2022-07-24 | Disposition: A | Discharge status: Home / Self Care | Payer: PPO | Source: Ambulatory Visit | Attending: Family Medicine | Admitting: Family Medicine

## 2022-07-24 DIAGNOSIS — Z1231 Encounter for screening mammogram for malignant neoplasm of breast: Secondary | ICD-10-CM | POA: Diagnosis present

## 2022-09-04 ENCOUNTER — Ambulatory Visit (INDEPENDENT_AMBULATORY_CARE_PROVIDER_SITE_OTHER): Payer: PPO | Admitting: Gastroenterology

## 2022-09-04 ENCOUNTER — Encounter (INDEPENDENT_AMBULATORY_CARE_PROVIDER_SITE_OTHER): Payer: Self-pay | Admitting: Gastroenterology

## 2022-09-20 ENCOUNTER — Telehealth: Payer: Self-pay | Admitting: Internal Medicine

## 2022-09-20 MED ORDER — FUROSEMIDE 20 MG PO TABS: 20.0000 mg | ORAL_TABLET | Freq: Every day | ORAL | 0 refills | Status: DC

## 2022-09-20 NOTE — Telephone Encounter (Signed)
Patient informed and verbalized understanding of plan. 

## 2022-09-20 NOTE — Telephone Encounter (Signed)
Reports swelling in both feet are worse than before. Denies dizziness, chest pain or sob. Reports no medications changes. Denies change in diet or fluid intake. Reports swelling is constant and is present when she gets up in the mornings. Reports she does not wear compression stockings. Advised to start wearing compression stockings daily to help with swelling. Reports weighing daily. See weights below.Reports baseline weight is around 156 lbs. 09/20/2022 155. 6 lbs 09/19/2022 156.6 lbs 09/18/2022 156.6 lbs 09/17/2022 156.3 lbs Advised to continue daily weights and contact office in 2 weeks with an update on swelling. Verbalized understanding of plan.

## 2022-09-20 NOTE — Addendum Note (Signed)
Addended by: Eustace Moore on: 09/20/2022 05:03 PM   Modules accepted: Orders

## 2022-09-20 NOTE — Telephone Encounter (Signed)
Pt c/o swelling: STAT is pt has developed SOB within 24 hours  If swelling, where is the swelling located? Both feet but left is more swollen   How much weight have you gained and in what time span? No   Have you gained 3 pounds in a day or 5 pounds in a week? No   Do you have a log of your daily weights (if so, list)? Did not take   Are you currently taking a fluid pill? No   Are you currently SOB? No   Have you traveled recently?  No   Pt asked if she should be taking something since she is swelling in both feet, please advise

## 2022-09-25 ENCOUNTER — Encounter (INDEPENDENT_AMBULATORY_CARE_PROVIDER_SITE_OTHER): Payer: Self-pay | Admitting: Gastroenterology

## 2022-09-25 ENCOUNTER — Ambulatory Visit (INDEPENDENT_AMBULATORY_CARE_PROVIDER_SITE_OTHER): Payer: PPO | Admitting: Gastroenterology

## 2022-09-25 VITALS — BP 142/87 | HR 88 | Temp 97.8°F | Ht 64.0 in | Wt 155.9 lb

## 2022-09-25 DIAGNOSIS — K580 Irritable bowel syndrome with diarrhea: Secondary | ICD-10-CM | POA: Diagnosis not present

## 2022-09-25 DIAGNOSIS — K219 Gastro-esophageal reflux disease without esophagitis: Secondary | ICD-10-CM

## 2022-09-25 MED ORDER — DICYCLOMINE HCL 10 MG PO CAPS: 10.0000 mg | ORAL_CAPSULE | Freq: Two times a day (BID) | ORAL | 1 refills | Status: DC | PRN

## 2022-09-25 NOTE — Patient Instructions (Addendum)
Continue Imodium as needed for diarrhea - if you have more than 3 bowel movements per day or going out of home. Continue omeprazole 40 mg every day  If interested in undergoing colonoscopy, please call our office to notify us about this Start Bentyl 1 tablet q12h as needed for abdominal pain

## 2022-09-25 NOTE — Progress Notes (Signed)
Stephanie Wood, M.D. Gastroenterology & Hepatology [facility] Gastroenterology [address] [place], [place] [address]  Primary Care Physician: Gareth Morgan, MD [address] [place] [address]  I will communicate my assessment and recommendations to the referring MD via EMR.  Problems: IBS-D Chronic regurgitation   History of Present Illness: Stephanie Wood is a 88 y.o. 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  The patient was last seen on 03/13/2022. At that time, the patient was advised to start omeprazole 40 mg every day and to stop Pepcid.  EGD was performed with findings described below.  Patient reports that she has had diarrhea since 2015. She believes that she has had worsening of her diarrhea for the last few months. She will have diarrhea even if not eating. She is having 3-4 bowel movements per day. Sometimes she only has one bowel movement  per day. Stool is usually yellow and has a sand like appearance. She has some fecal urgency and few accidents in the past. She does use Imodium but never on a daily basis, never has tried it more than once per day. She has had mid abdominal discomfort occasionally.   States her heartburn and regurgitation have markedly improved with omeprazole and rarely has the symptoms anymore.  States she notices having significant abdominal pain after she defecates sometimes.  The patient denies having any nausea, vomiting, fever, chills, hematochezia, melena, hematemesis, jaundice, pruritus or weight loss.  Had a esophagram in 2020:  Very large hiatal hernia with greater than 50% of stomach in the lower chest.  Moderate diffuse esophageal dysmotility with prolonged thoracic retention of contrast. No laryngeal penetration or aspiration. No esophageal mass or  stricture.  Last EGD: 02/2022 6 centimeter hiatal hernia, normal stomach and duodenum   Last Colonoscopy: 2015 Examination performed to cecum. No evidence of recurrent polyps. Single small diverticulum at sigmoid colon. Small external hemorrhoids and anal papillae.  Past Medical History: Past Medical History:  Diagnosis Date   Acid reflux disease    Anemia    Anxiety    Colonic adenoma    Depression    Irritable bowel syndrome     Past Surgical History: Past Surgical History:  Procedure Laterality Date   CATARACT EXTRACTION     COLONOSCOPY N/A 04/15/2014   Procedure: COLONOSCOPY;  Surgeon: Malissa Hippo, MD;  [facility];  Service: Endoscopy;  Laterality: N/A;  730   ESOPHAGOGASTRODUODENOSCOPY  09/20/2011   Procedure: ESOPHAGOGASTRODUODENOSCOPY (EGD);  Surgeon: Malissa Hippo, MD;  [facility];  Service: Endoscopy;  Laterality: N/A;  215   ESOPHAGOGASTRODUODENOSCOPY (EGD) WITH PROPOFOL N/A 03/16/2022   Procedure: ESOPHAGOGASTRODUODENOSCOPY (EGD) WITH PROPOFOL;  Surgeon: Dolores Frame, MD;  [facility];  Service: Gastroenterology;  Laterality: N/A;  1230 ASA 3   EYE SURGERY     bil catatract surgery    Family History: Family History  Problem Relation Age of Onset   Prostate cancer Father     Social History: Social History   Tobacco Use  Smoking Status Never   Passive exposure: Never  Smokeless Tobacco Never   Social History   Substance and Sexual Activity  Alcohol Use No   Alcohol/week: 0.0 standard drinks of alcohol   Social History   Substance and Sexual Activity  Drug Use No    Allergies: Allergies  Allergen Reactions   Drug Class [Trazodone And Nefazodone]    Macrobid [Nitrofurantoin] Other (See Comments)    Elevated LFT's    Medications: Current Outpatient Medications  Medication Sig Dispense Refill   furosemide (LASIX) 20 MG tablet Take 1 tablet (20 mg total) by mouth daily. 30 tablet 0    ALPRAZolam (XANAX) 0.5 MG tablet Take 0.5 mg by mouth at bedtime.     atorvastatin (LIPITOR) 20 MG tablet Take 20 mg by mouth daily.     bifidobacterium infantis (ALIGN) capsule Take 1 capsule by mouth daily.     Calcium Carbonate (CALCIUM 600 PO) Take 600 mg by mouth daily.     ELDERBERRY PO Take 1 capsule by mouth daily.     loperamide (IMODIUM) 2 MG capsule Take 2 mg by mouth as needed for diarrhea or loose stools. 30 capsule    loratadine (CLARITIN) 10 MG tablet Take 10 mg by mouth daily.     methenamine (HIPREX) 1 g tablet Take 1 tablet (1 g total) by mouth 2 (two) times daily. 90 tablet 8   omeprazole (PRILOSEC) 40 MG capsule Take 1 capsule (40 mg total) by mouth daily. 90 capsule 3   Pediatric Multivitamins-Iron (FLINTSTONES PLUS IRON) chewable tablet Chew 1 tablet by mouth 2 (two) times a week.     PROLIA 60 MG/ML SOSY injection Inject 60 mg into the skin every 6 (six) months.     vitamin B-12 (CYANOCOBALAMIN) 1000 MCG tablet Take 1,000 mcg by mouth daily.     Vitamin D, Ergocalciferol, (DRISDOL) 50000 UNITS CAPS capsule Take 50,000 Units by mouth every 7 (seven) days.     No current facility-administered medications for this visit.    Review of Systems: GENERAL: negative for malaise, night sweats HEENT: No changes in hearing or vision, no nose bleeds or other nasal problems. NECK: Negative for lumps, goiter, pain and significant neck swelling RESPIRATORY: Negative for cough, wheezing CARDIOVASCULAR: Negative for chest pain, leg swelling, palpitations, orthopnea GI: SEE HPI MUSCULOSKELETAL: Negative for joint pain or swelling, back pain, and muscle pain. SKIN: Negative for lesions, rash PSYCH: Negative for sleep disturbance, mood disorder and recent psychosocial stressors. HEMATOLOGY Negative for prolonged bleeding, bruising easily, and swollen nodes. ENDOCRINE: Negative for cold or heat intolerance, polyuria, polydipsia and goiter. NEURO: negative for tremor, gait imbalance,  syncope and seizures. The remainder of the review of systems is noncontributory.   Physical Exam: BP (!) 155/85 (BP Location: Left Arm, Patient Position: Sitting, Cuff Size: Normal)   Pulse 92   Temp 97.8 F (36.6 C) (Temporal)   Ht 5\' 4"  (1.626 m)   Wt 155 lb 14.4 oz (70.7 kg)   BMI 26.76 kg/m  GENERAL: The patient is AO x3, in no acute distress. HEENT: Head is normocephalic and atraumatic. EOMI are intact. Mouth is well hydrated and without lesions. NECK: Supple. No masses LUNGS: Clear to auscultation. No presence of rhonchi/wheezing/rales. Adequate chest expansion HEART: RRR, normal s1 and s2. ABDOMEN: Soft, nontender, no guarding, no peritoneal signs, and nondistended. BS +. No masses. EXTREMITIES: Without any cyanosis, clubbing, rash, lesions or edema. NEUROLOGIC: AOx3, no focal motor deficit. SKIN: no jaundice, no rashes  Imaging/Labs: as above  I personally reviewed and interpreted the available labs, imaging and endoscopic files.  Impression and Plan: Stephanie Wood is a 88 y.o. 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  Patient  endorses having recurrent episodes chronically which have slightly worsened recently.  She has no presented any associated significant red flag symptoms.  She has been able to manage her symptoms relatively well with Imodium but does not take this regularly.  We discussed the possibility of exploring her symptoms further with a colonoscopy with random colonic biopsies but as she has no presented any red flag signs she would like to hold off on this for now unless strictly necessary, which I find reasonable.  For now, she can keep taking Imodium as needed for diarrhea.  She can try taking some Bentyl as needed for abdominal pain episodes related to IBS-D.  Finally, her regurgitation and heartburn has much more  improved with use omeprazole.  Will continue this medication at 40 mg dosing daily.  - Continue Imodium as needed for diarrhea if more than 3 bowel movements per day or going out of home. - Continue omeprazole 40 mg every day  - Start Bentyl 1 tablet q12h as needed for abdominal pain  All questions were answered.      Stephanie Blazing, MD Gastroenterology and Hepatology [facility] Gastroenterology

## 2022-10-05 ENCOUNTER — Encounter: Payer: Self-pay | Admitting: Internal Medicine

## 2022-10-05 ENCOUNTER — Ambulatory Visit (INDEPENDENT_AMBULATORY_CARE_PROVIDER_SITE_OTHER): Payer: PPO | Admitting: Internal Medicine

## 2022-10-05 VITALS — BP 126/70 | HR 91 | Ht 61.0 in | Wt 156.6 lb

## 2022-10-05 DIAGNOSIS — E785 Hyperlipidemia, unspecified: Secondary | ICD-10-CM

## 2022-10-05 DIAGNOSIS — I10 Essential (primary) hypertension: Secondary | ICD-10-CM

## 2022-10-05 DIAGNOSIS — I340 Nonrheumatic mitral (valve) insufficiency: Secondary | ICD-10-CM

## 2022-10-05 DIAGNOSIS — K219 Gastro-esophageal reflux disease without esophagitis: Secondary | ICD-10-CM | POA: Diagnosis not present

## 2022-10-05 DIAGNOSIS — N390 Urinary tract infection, site not specified: Secondary | ICD-10-CM

## 2022-10-05 DIAGNOSIS — N1831 Chronic kidney disease, stage 3a: Secondary | ICD-10-CM

## 2022-10-05 DIAGNOSIS — K580 Irritable bowel syndrome with diarrhea: Secondary | ICD-10-CM

## 2022-10-05 DIAGNOSIS — M7989 Other specified soft tissue disorders: Secondary | ICD-10-CM

## 2022-10-05 DIAGNOSIS — N952 Postmenopausal atrophic vaginitis: Secondary | ICD-10-CM

## 2022-10-05 DIAGNOSIS — F411 Generalized anxiety disorder: Secondary | ICD-10-CM

## 2022-10-05 DIAGNOSIS — M810 Age-related osteoporosis without current pathological fracture: Secondary | ICD-10-CM | POA: Diagnosis not present

## 2022-10-05 NOTE — Patient Instructions (Signed)
It was a pleasure to see you today.  Thank you for giving [place] the opportunity to be involved in your care.  Below is a brief recap of your visit and next steps.  We will plan to see you again in 4 months.  Summary You have established care today No medication changes have been made We will review and update records from Dr. Michelle Nasuti office Follow up in 4 months

## 2022-10-05 NOTE — Progress Notes (Signed)
New Patient Office Visit  Subjective    Patient ID: Stephanie Wood, female    DOB: 1934-07-20  Age: 88 y.o. MRN: [id number]  CC:  Chief Complaint  Patient presents with   Establish Care   HPI Stephanie Wood presents to establish care.  She is an 88 year old woman who endorses a past medical history significant for IBS-D, anxiety/insomnia, HLD, chronic bilateral ankle swelling, osteoporosis, GERD, HTN, CKD stage III, recurrent UTIs, and mild MR.  She was previously followed by Dr. Sudie Bailey.  Stephanie Wood reports feeling well today.  She is asymptomatic and has no acute concerns to discuss aside from desiring to establish care.  Chronic medical conditions and outstanding preventative care items discussed today are individually addressed in A/P below.  Outpatient Encounter Medications as of 10/05/2022  Medication Sig   ALPRAZolam (XANAX) 0.5 MG tablet Take 0.5 mg by mouth at bedtime.   atorvastatin (LIPITOR) 20 MG tablet Take 20 mg by mouth daily.   bifidobacterium infantis (ALIGN) capsule Take 1 capsule by mouth daily.   Calcium Carbonate (CALCIUM 600 PO) Take 600 mg by mouth daily.   dicyclomine (BENTYL) 10 MG capsule Take 1 capsule (10 mg total) by mouth every 12 (twelve) hours as needed for spasms (abdominal pain).   ELDERBERRY PO Take 1 capsule by mouth daily.   loperamide (IMODIUM) 2 MG capsule Take 2 mg by mouth as needed for diarrhea or loose stools.   loratadine (CLARITIN) 10 MG tablet Take 10 mg by mouth daily.   methenamine (HIPREX) 1 g tablet Take 1 tablet (1 g total) by mouth 2 (two) times daily.   omeprazole (PRILOSEC) 40 MG capsule Take 1 capsule (40 mg total) by mouth daily.   Pediatric Multivitamins-Iron (FLINTSTONES PLUS IRON) chewable tablet Chew 1 tablet by mouth 2 (two) times a week.   PROLIA 60 MG/ML SOSY injection Inject 60 mg into the skin every 6 (six) months.   vitamin B-12 (CYANOCOBALAMIN) 1000 MCG tablet Take 1,000 mcg by mouth daily.   [DISCONTINUED] furosemide (LASIX)  20 MG tablet Take 1 tablet (20 mg total) by mouth daily.   No facility-administered encounter medications on file as of 10/05/2022.    Past Medical History:  Diagnosis Date   Acid reflux disease    Anemia    Anxiety    Colonic adenoma    Depression    Irritable bowel syndrome     Past Surgical History:  Procedure Laterality Date   CATARACT EXTRACTION     COLONOSCOPY N/A 04/15/2014   Procedure: COLONOSCOPY;  Surgeon: Malissa Hippo, MD;  [facility];  Service: Endoscopy;  Laterality: N/A;  730   ESOPHAGOGASTRODUODENOSCOPY  09/20/2011   Procedure: ESOPHAGOGASTRODUODENOSCOPY (EGD);  Surgeon: Malissa Hippo, MD;  [facility];  Service: Endoscopy;  Laterality: N/A;  215   ESOPHAGOGASTRODUODENOSCOPY (EGD) WITH PROPOFOL N/A 03/16/2022   Procedure: ESOPHAGOGASTRODUODENOSCOPY (EGD) WITH PROPOFOL;  Surgeon: Dolores Frame, MD;  [facility];  Service: Gastroenterology;  Laterality: N/A;  1230 ASA 3   EYE SURGERY     bil catatract surgery    Family History  Problem Relation Age of Onset   Prostate cancer Father     Social History   Socioeconomic History   Marital status: Widowed    Spouse name: Not on file   Number of children: 1   Years of education: Not on file   Highest education level: Not on file  Occupational History   Occupation: reitred  Tobacco Use   Smoking status: Never    Passive exposure: Never   Smokeless tobacco: Never  Vaping Use   Vaping Use: Never used  Substance and Sexual Activity   Alcohol use: No    Alcohol/week: 0.0 standard drinks of alcohol   Drug use: No   Sexual activity: Not on file  Other Topics Concern   Not on file  Social History Narrative   Not on file   Social Determinants of Health   Financial Resource Strain: Not on file  Food Insecurity: Not on file  Transportation Needs: Not on file  Physical Activity: Not on file  Stress: Not on file  Social Connections: Not on file  Intimate  Partner Violence: Not on file    Review of Systems  Constitutional:  Negative for chills and fever.  HENT:  Negative for sore throat.   Respiratory:  Negative for cough and shortness of breath.   Cardiovascular:  Negative for chest pain, palpitations and leg swelling.  Gastrointestinal:  Negative for abdominal pain, blood in stool, constipation, diarrhea, nausea and vomiting.  Genitourinary:  Negative for dysuria and hematuria.  Musculoskeletal:  Negative for myalgias.  Skin:  Negative for itching and rash.  Neurological:  Negative for dizziness and headaches.  Psychiatric/Behavioral:  Negative for depression and suicidal ideas.         Objective    BP 126/70   Pulse 91   Ht 5\' 1"  (1.549 m)   Wt 156 lb 9.6 oz (71 kg)   SpO2 93%   BMI 29.59 kg/m   Physical Exam Vitals reviewed.  Constitutional:      General: She is not in acute distress.    Appearance: Normal appearance. She is not toxic-appearing.  HENT:     Head: Normocephalic and atraumatic.     Right Ear: External ear normal.     Left Ear: External ear normal.     Nose: Nose normal. No congestion or rhinorrhea.     Mouth/Throat:     Mouth: Mucous membranes are moist.     Pharynx: Oropharynx is clear. No oropharyngeal exudate or posterior oropharyngeal erythema.  Eyes:     General: No scleral icterus.    Extraocular Movements: Extraocular movements intact.     Conjunctiva/sclera: Conjunctivae normal.     Pupils: Pupils are equal, round, and reactive to light.  Cardiovascular:     Rate and Rhythm: Normal rate and regular rhythm.     Pulses: Normal pulses.     Heart sounds: Normal heart sounds. No murmur heard.    No friction rub. No gallop.  Pulmonary:     Effort: Pulmonary effort is normal.     Breath sounds: Normal breath sounds. No wheezing, rhonchi or rales.  Abdominal:     General: Abdomen is flat. Bowel sounds are normal. There is no distension.     Palpations: Abdomen is soft.     Tenderness: There is  no abdominal tenderness.  Musculoskeletal:        General: No swelling. Normal range of motion.     Cervical back: Normal range of motion.     Right lower leg: No edema.     Left lower leg: No edema.  Lymphadenopathy:     Cervical: No cervical adenopathy.  Skin:    General: Skin is warm and dry.     Capillary Refill: Capillary refill takes less than 2 seconds.     Coloration: Skin is not jaundiced.  Neurological:  General: No focal deficit present.     Mental Status: She is alert and oriented to person, place, and time.  Psychiatric:        Mood and Affect: Mood normal.        Behavior: Behavior normal.    Assessment & Plan:   Problem List Items Addressed This Visit       Essential hypertension    History of HTN.  Not currently on any antihypertensive medications.  BP 126/70 today. -No indication to resume antihypertensive therapy      Mild mitral regurgitation    Noted on TTE from December 2023.  Repeat TTE recommended for 3 years.      Gastroesophageal reflux disease - Primary    Symptoms are currently well-controlled with omeprazole. -No medication changes today      Irritable bowel syndrome with diarrhea    IBS-D.  Currently using Imodium and Bentyl as needed. -No medication changes today      Osteoporosis    T-score -2.5 on DEXA from 2019.  She is currently prescribed Prolia. -No medication changes today      CKD (chronic kidney disease) stage 3, GFR 30-59 ml/min (HCC)    GFR 47 on labs from February. -Repeat labs at follow-up in 4 months      Recurrent UTI    History of recurrent UTI.  Followed by urology (Dr. Retta Diones) and is prescribed methenamine. -No medication changes today.  Urology follow-up scheduled for December 2024.      Atrophic vaginitis    Has topical estrogen cream      Bilateral swelling of feet (Chronic)    She is prescribed Lasix 20 mg daily as needed for relief of bilateral ankle swelling.  No edema is present on exam  today.      Anxiety state    She is currently prescribed alprazolam 0.5 mg nightly for treatment of anxiety and insomnia.  PDMP reviewed and is appropriate. -No medication changes today      Hyperlipidemia    Currently prescribed atorvastatin 20 mg daily.  Lipid panel updated in February. -No medication changes today       Return in about 4 months (around 02/05/2023).   Billie Lade, MD

## 2022-10-10 ENCOUNTER — Telehealth: Payer: Self-pay | Admitting: Internal Medicine

## 2022-10-10 MED ORDER — FUROSEMIDE 20 MG PO TABS: 20.0000 mg | ORAL_TABLET | ORAL | Status: DC | PRN

## 2022-10-10 NOTE — Telephone Encounter (Signed)
Pt c/o medication issue:  1. Name of Medication:   furosemide (LASIX) 20 MG tablet   2. How are you currently taking this medication (dosage and times per day)?  Patient took as prescribed 4/24 through 5/4 and has stopped  3. Are you having a reaction (difficulty breathing--STAT)? No  4. What is your medication issue?   Patient stated she had been taking this medication and she has stopped swelling and stopped taking the medication on 5/4.  Patient wants to know if she should only be taking this medicine.  Patient stated weight was initially 156 lbs and since 5/6 is running 153-154 lbs but her feet are no longer swollen and she is concerned about dehydration.

## 2022-10-10 NOTE — Telephone Encounter (Signed)
Patient notified and verbalized understanding. 

## 2022-10-11 ENCOUNTER — Encounter: Payer: Self-pay | Admitting: Internal Medicine

## 2022-10-11 DIAGNOSIS — N952 Postmenopausal atrophic vaginitis: Secondary | ICD-10-CM | POA: Insufficient documentation

## 2022-10-11 DIAGNOSIS — N183 Chronic kidney disease, stage 3 unspecified: Secondary | ICD-10-CM | POA: Insufficient documentation

## 2022-10-11 DIAGNOSIS — N390 Urinary tract infection, site not specified: Secondary | ICD-10-CM | POA: Insufficient documentation

## 2022-10-11 DIAGNOSIS — E785 Hyperlipidemia, unspecified: Secondary | ICD-10-CM | POA: Insufficient documentation

## 2022-10-11 DIAGNOSIS — I10 Essential (primary) hypertension: Secondary | ICD-10-CM | POA: Insufficient documentation

## 2022-10-11 DIAGNOSIS — I340 Nonrheumatic mitral (valve) insufficiency: Secondary | ICD-10-CM | POA: Insufficient documentation

## 2022-10-11 NOTE — Assessment & Plan Note (Signed)
History of HTN.  Not currently on any antihypertensive medications.  BP 126/70 today. -No indication to resume antihypertensive therapy

## 2022-10-11 NOTE — Assessment & Plan Note (Signed)
She is currently prescribed alprazolam 0.5 mg nightly for treatment of anxiety and insomnia.  PDMP reviewed and is appropriate. -No medication changes today

## 2022-10-11 NOTE — Assessment & Plan Note (Signed)
Noted on TTE from December 2023.  Repeat TTE recommended for 3 years.

## 2022-10-11 NOTE — Assessment & Plan Note (Signed)
Symptoms are currently well-controlled with omeprazole.  No medication changes today. 

## 2022-10-11 NOTE — Assessment & Plan Note (Signed)
T-score -2.5 on DEXA from 2019.  She is currently prescribed Prolia. -No medication changes today

## 2022-10-11 NOTE — Assessment & Plan Note (Signed)
History of recurrent UTI.  Followed by urology (Dr. Retta Diones) and is prescribed methenamine. -No medication changes today.  Urology follow-up scheduled for December 2024.

## 2022-10-11 NOTE — Assessment & Plan Note (Signed)
Currently prescribed atorvastatin 20 mg daily.  Lipid panel updated in February. -No medication changes today

## 2022-10-11 NOTE — Assessment & Plan Note (Addendum)
She is prescribed Lasix 20 mg daily as needed for relief of bilateral ankle swelling.  No edema is present on exam today.

## 2022-10-11 NOTE — Assessment & Plan Note (Signed)
GFR 47 on labs from February. -Repeat labs at follow-up in 6 months

## 2022-10-11 NOTE — Assessment & Plan Note (Signed)
IBS-D.  Currently using Imodium and Bentyl as needed. -No medication changes today

## 2022-10-11 NOTE — Assessment & Plan Note (Signed)
Has topical estrogen cream

## 2022-12-20 ENCOUNTER — Ambulatory Visit (INDEPENDENT_AMBULATORY_CARE_PROVIDER_SITE_OTHER): Payer: PPO

## 2022-12-20 VITALS — BP 116/68 | Ht 62.0 in | Wt 153.7 lb

## 2022-12-20 DIAGNOSIS — Z0000 Encounter for general adult medical examination without abnormal findings: Secondary | ICD-10-CM

## 2022-12-20 DIAGNOSIS — Z780 Asymptomatic menopausal state: Secondary | ICD-10-CM

## 2022-12-20 NOTE — Patient Instructions (Signed)
Ms. Ketner , Thank you for taking time to come for your Medicare Wellness Visit. I appreciate your ongoing commitment to your health goals. Please review the following plan we discussed and let me know if I can assist you in the future.   These are the goals we discussed:  Goals       Patient Stated (pt-stated)      Patient's goal is to be more active         This is a list of the screening recommended for you and due dates:  Health Maintenance  Topic Date Due   DTaP/Tdap/Td vaccine (1 - Tdap) Never done   Zoster (Shingles) Vaccine (1 of 2) Never done   Pneumonia Vaccine (1 of 1 - PCV) Never done   COVID-19 Vaccine (1 - 2023-24 season) Never done   Flu Shot  12/28/2022   Medicare Annual Wellness Visit  12/20/2023   DEXA scan (bone density measurement)  Completed   HPV Vaccine  Aged Out    Advanced directives: Advance directive discussed with you today. Even though you declined this today, please call our office should you change your mind, and we can give you the proper paperwork for you to fill out. Advance care planning is a way to make decisions about medical care that fits your values in case you are ever unable to make these decisions for yourself.  Information on Advanced Care Planning can be found at [facility] of [facility] Advance Health Care Directives Advance Health Care Directives ([email or web address]    Conditions/risks identified:  You have an order for:  []   2D Mammogram  []   3D Mammogram  [x]   Bone Density   []   Lung Cancer Screening  Please call for appointment:   [facility] [address] [place], [place] [address] [phone]  Make sure to wear two-piece clothing.  No lotions powders or deodorants the day of the appointment Make sure to bring picture ID and insurance card.  Bring list of medications you are currently taking including any supplements.   Schedule your [place] screening mammogram through  MyChart!   Log into your MyChart account.  Go to 'Visit' (or 'Appointments' if on mobile App) --> Schedule an Appointment  Under 'Select a Reason for Visit' choose the Mammogram Screening option.  Complete the pre-visit questions and select the time and place that best fits your schedule.  You are due for the vaccines checked below. You may have these done at your preferred pharmacy. Please have them fax the office proof of the vaccines so that we can update your chart.   []  Flu (due annually) [x]  Shingrix (Shingles vaccine) [x]  Pneumonia Vaccines [x]  TDAP (Tetanus) Vaccine every 10 years [x]  Covid-19   Next appointment: VIRTUAL/TELEPHONE APPOINTMENT Follow up in one year for your annual wellness visit  January 23, 2024 at 3:30 pm telephone visit.    Preventive Care 10 Years and Older, Female Preventive care refers to lifestyle choices and visits with your health care provider that can promote health and wellness. What does preventive care include? A yearly physical exam. This is also called an annual well check. Dental exams once or twice a year. Routine eye exams. Ask your health care provider how often you should have your eyes checked. Personal lifestyle choices, including: Daily care of your teeth and gums. Regular physical activity. Eating a healthy diet. Avoiding tobacco and drug use. Limiting alcohol use. Practicing safe sex. Taking low-dose  aspirin every day. Taking vitamin and mineral supplements as recommended by your health care provider. What happens during an annual well check? The services and screenings done by your health care provider during your annual well check will depend on your age, overall health, lifestyle risk factors, and family history of disease. Counseling  Your health care provider may ask you questions about your: Alcohol use. Tobacco use. Drug use. Emotional well-being. Home and relationship well-being. Sexual activity. Eating  habits. History of falls. Memory and ability to understand (cognition). Work and work Astronomer. Reproductive health. Screening  You may have the following tests or measurements: Height, weight, and BMI. Blood pressure. Lipid and cholesterol levels. These may be checked every 5 years, or more frequently if you are over 17 years old. Skin check. Lung cancer screening. You may have this screening every year starting at age 56 if you have a 30-pack-year history of smoking and currently smoke or have quit within the past 15 years. Fecal occult blood test (FOBT) of the stool. You may have this test every year starting at age 36. Flexible sigmoidoscopy or colonoscopy. You may have a sigmoidoscopy every 5 years or a colonoscopy every 10 years starting at age 25. Hepatitis C blood test. Hepatitis B blood test. Sexually transmitted disease (STD) testing. Diabetes screening. This is done by checking your blood sugar (glucose) after you have not eaten for a while (fasting). You may have this done every 1-3 years. Bone density scan. This is done to screen for osteoporosis. You may have this done starting at age 68. Mammogram. This may be done every 1-2 years. Talk to your health care provider about how often you should have regular mammograms. Talk with your health care provider about your test results, treatment options, and if necessary, the need for more tests. Vaccines  Your health care provider may recommend certain vaccines, such as: Influenza vaccine. This is recommended every year. Tetanus, diphtheria, and acellular pertussis (Tdap, Td) vaccine. You may need a Td booster every 10 years. Zoster vaccine. You may need this after age 25. Pneumococcal 13-valent conjugate (PCV13) vaccine. One dose is recommended after age 58. Pneumococcal polysaccharide (PPSV23) vaccine. One dose is recommended after age 80. Talk to your health care provider about which screenings and vaccines you need and how  often you need them. This information is not intended to replace advice given to you by your health care provider. Make sure you discuss any questions you have with your health care provider. Document Released: 06/11/2015 Document Revised: 02/02/2016 Document Reviewed: 03/16/2015 Elsevier Interactive Patient Education  2017 [employer].  Fall Prevention in the Home Falls can cause injuries. They can happen to people of all ages. There are many things you can do to make your home safe and to help prevent falls. What can I do on the outside of my home? Regularly fix the edges of walkways and driveways and fix any cracks. Remove anything that might make you trip as you walk through a door, such as a raised step or threshold. Trim any bushes or trees on the path to your home. Use bright outdoor lighting. Clear any walking paths of anything that might make someone trip, such as rocks or tools. Regularly check to see if handrails are loose or broken. Make sure that both sides of any steps have handrails. Any raised decks and porches should have guardrails on the edges. Have any leaves, snow, or ice cleared regularly. Use sand or salt on walking paths during  winter. Clean up any spills in your garage right away. This includes oil or grease spills. What can I do in the bathroom? Use night lights. Install grab bars by the toilet and in the tub and shower. Do not use towel bars as grab bars. Use non-skid mats or decals in the tub or shower. If you need to sit down in the shower, use a plastic, non-slip stool. Keep the floor dry. Clean up any water that spills on the floor as soon as it happens. Remove soap buildup in the tub or shower regularly. Attach bath mats securely with double-sided non-slip rug tape. Do not have throw rugs and other things on the floor that can make you trip. What can I do in the bedroom? Use night lights. Make sure that you have a light by your bed that is easy to  reach. Do not use any sheets or blankets that are too big for your bed. They should not hang down onto the floor. Have a firm chair that has side arms. You can use this for support while you get dressed. Do not have throw rugs and other things on the floor that can make you trip. What can I do in the kitchen? Clean up any spills right away. Avoid walking on wet floors. Keep items that you use a lot in easy-to-reach places. If you need to reach something above you, use a strong step stool that has a grab bar. Keep electrical cords out of the way. Do not use floor polish or wax that makes floors slippery. If you must use wax, use non-skid floor wax. Do not have throw rugs and other things on the floor that can make you trip. What can I do with my stairs? Do not leave any items on the stairs. Make sure that there are handrails on both sides of the stairs and use them. Fix handrails that are broken or loose. Make sure that handrails are as long as the stairways. Check any carpeting to make sure that it is firmly attached to the stairs. Fix any carpet that is loose or worn. Avoid having throw rugs at the top or bottom of the stairs. If you do have throw rugs, attach them to the floor with carpet tape. Make sure that you have a light switch at the top of the stairs and the bottom of the stairs. If you do not have them, ask someone to add them for you. What else can I do to help prevent falls? Wear shoes that: Do not have high heels. Have rubber bottoms. Are comfortable and fit you well. Are closed at the toe. Do not wear sandals. If you use a stepladder: Make sure that it is fully opened. Do not climb a closed stepladder. Make sure that both sides of the stepladder are locked into place. Ask someone to hold it for you, if possible. Clearly mark and make sure that you can see: Any grab bars or handrails. First and last steps. Where the edge of each step is. Use tools that help you move  around (mobility aids) if they are needed. These include: Canes. Walkers. Scooters. Crutches. Turn on the lights when you go into a dark area. Replace any light bulbs as soon as they burn out. Set up your furniture so you have a clear path. Avoid moving your furniture around. If any of your floors are uneven, fix them. If there are any pets around you, be aware of where they are. Review your  medicines with your doctor. Some medicines can make you feel dizzy. This can increase your chance of falling. Ask your doctor what other things that you can do to help prevent falls. This information is not intended to replace advice given to you by your health care provider. Make sure you discuss any questions you have with your health care provider. Document Released: 03/11/2009 Document Revised: 10/21/2015 Document Reviewed: 06/19/2014 Elsevier Interactive Patient Education  2017 [employer].

## 2022-12-20 NOTE — Progress Notes (Signed)
 Subjective:   Stephanie Wood is a 88 y.o. female who presents for Medicare Annual (Subsequent) preventive examination.  Visit Complete: Virtual  I connected with  Stephanie Wood on 12/20/22 by a audio enabled telemedicine application and verified that I am speaking with the correct person using two identifiers.  Patient Location: Home  Provider Location: Home Office  I discussed the limitations of evaluation and management by telemedicine. The patient expressed understanding and agreed to proceed.  Patient Medicare AWV questionnaire was completed by the patient on n/a; I have confirmed that all information answered by patient is correct and no changes since this date.  Review of Systems     Cardiac Risk Factors include: advanced age (>45men, >73 women);dyslipidemia;hypertension;sedentary lifestyle     Objective:    Today's Vitals   12/20/22 1517 12/20/22 1519  BP: 116/68   Weight: 153 lb 11.2 oz (69.7 kg)   Height: 5\' 2"  (1.575 m)   PainSc:  6    Body mass index is 28.11 kg/m.     12/20/2022    3:17 PM 03/16/2022   10:53 AM 01/08/2021   10:52 AM 05/30/2015    9:25 AM 04/15/2014    7:09 AM 09/20/2011    1:21 PM  Advanced Directives  Does Patient Have a Medical Advance Directive? No Yes No Yes Yes Patient has advance directive, copy not in chart  Type of Advance Directive  Healthcare Power of [place];Living will  Healthcare Power of [place];Living will  Healthcare Power of Attorney  Copy of Healthcare Power of Attorney in Chart?  No - copy requested   No - copy requested   Would patient like information on creating a medical advance directive? No - Patient declined  No - Patient declined No - patient declined information    Pre-existing out of facility DNR order (yellow form or pink MOST form)      No    Current Medications (verified) Outpatient Encounter Medications as of 12/20/2022  Medication Sig   ALPRAZolam (XANAX) 0.5 MG tablet Take 0.5 mg by mouth at bedtime.    atorvastatin (LIPITOR) 20 MG tablet Take 20 mg by mouth daily.   bifidobacterium infantis (ALIGN) capsule Take 1 capsule by mouth daily.   Calcium Carbonate (CALCIUM 600 PO) Take 600 mg by mouth daily.   dicyclomine (BENTYL) 10 MG capsule Take 1 capsule (10 mg total) by mouth every 12 (twelve) hours as needed for spasms (abdominal pain).   ELDERBERRY PO Take 1 capsule by mouth daily.   furosemide (LASIX) 20 MG tablet Take 1 tablet (20 mg total) by mouth as needed for edema (swelling or shortness of breath).   loperamide (IMODIUM) 2 MG capsule Take 2 mg by mouth as needed for diarrhea or loose stools.   loratadine (CLARITIN) 10 MG tablet Take 10 mg by mouth daily.   methenamine (HIPREX) 1 g tablet Take 1 tablet (1 g total) by mouth 2 (two) times daily.   omeprazole (PRILOSEC) 40 MG capsule Take 1 capsule (40 mg total) by mouth daily.   Pediatric Multivitamins-Iron (FLINTSTONES PLUS IRON) chewable tablet Chew 1 tablet by mouth 2 (two) times a week.   PROLIA 60 MG/ML SOSY injection Inject 60 mg into the skin every 6 (six) months.   vitamin B-12 (CYANOCOBALAMIN) 1000 MCG tablet Take 1,000 mcg by mouth daily.   No facility-administered encounter medications on file as of 12/20/2022.    Allergies (verified) Drug class [trazodone and nefazodone] and Macrobid [nitrofurantoin]   History: Past  Medical History:  Diagnosis Date   Acid reflux disease    Anemia    Anxiety    Colonic adenoma    Depression    Irritable bowel syndrome    Past Surgical History:  Procedure Laterality Date   CATARACT EXTRACTION     COLONOSCOPY N/A 04/15/2014   Procedure: COLONOSCOPY;  Surgeon: Malissa Hippo, MD;  Location: [facility];  Service: Endoscopy;  Laterality: N/A;  730   ESOPHAGOGASTRODUODENOSCOPY  09/20/2011   Procedure: ESOPHAGOGASTRODUODENOSCOPY (EGD);  Surgeon: Malissa Hippo, MD;  Location: [facility];  Service: Endoscopy;  Laterality: N/A;  215   ESOPHAGOGASTRODUODENOSCOPY (EGD) WITH PROPOFOL  N/A 03/16/2022   Procedure: ESOPHAGOGASTRODUODENOSCOPY (EGD) WITH PROPOFOL;  Surgeon: Dolores Frame, MD;  Location: [facility];  Service: Gastroenterology;  Laterality: N/A;  1230 ASA 3   EYE SURGERY     bil catatract surgery   Family History  Problem Relation Age of Onset   Prostate cancer Father    Social History   Socioeconomic History   Marital status: Widowed    Spouse name: Not on file   Number of children: 1   Years of education: Not on file   Highest education level: Not on file  Occupational History   Occupation: reitred  Tobacco Use   Smoking status: Never    Passive exposure: Never   Smokeless tobacco: Never  Vaping Use   Vaping status: Never Used  Substance and Sexual Activity   Alcohol use: No    Alcohol/week: 0.0 standard drinks of alcohol   Drug use: No   Sexual activity: Not on file  Other Topics Concern   Not on file  Social History Narrative   Not on file   Social Determinants of Health   Financial Resource Strain: Low Risk  (12/20/2022)   Overall Financial Resource Strain (CARDIA)    Difficulty of Paying Living Expenses: Not hard at all  Food Insecurity: No Food Insecurity (12/20/2022)   Hunger Vital Sign    Worried About Running Out of Food in the Last Year: Never true    Ran Out of Food in the Last Year: Never true  Transportation Needs: No Transportation Needs (12/20/2022)   PRAPARE - Administrator, Civil Service (Medical): No    Wood of Transportation (Non-Medical): No  Physical Activity: Sufficiently Active (12/20/2022)   Exercise Vital Sign    Days of Exercise per Week: 7 days    Minutes of Exercise per Session: 30 min  Stress: No Stress Concern Present (12/20/2022)   [employer] of Occupational Health - Occupational Stress Questionnaire    Feeling of Stress : Not at all  Social Connections: Socially Integrated (12/20/2022)   Social Connection and Isolation Panel [NHANES]    Frequency of Communication with  Friends and Family: More than three times a week    Frequency of Social Gatherings with Friends and Family: More than three times a week    Attends Religious Services: More than 4 times per year    Active Member of [employer] or Organizations: Yes    Attends Engineer, structural: More than 4 times per year    Marital Status: Married    Tobacco Counseling Counseling given: Yes   Clinical Intake:  Pre-visit preparation completed: Yes  Pain : 0-10 Pain Score: 6  Pain Type: Chronic pain Pain Location: Back Pain Orientation: Lower Pain Descriptors / Indicators: Constant Pain Onset: More than a month ago Pain Frequency: Constant  BMI - recorded: 28.11 Nutritional Status: BMI 25 -29 Overweight Nutritional Risks: None Diabetes: No  How often do you need to have someone help you when you read instructions, pamphlets, or other written materials from your doctor or pharmacy?: 1 - Never  Interpreter Needed?: No  Information entered by ::  Jimesha Rising, CMA   Activities of Daily Living    12/20/2022    3:28 PM  In your present state of health, do you have any difficulty performing the following activities:  Hearing? 0  Vision? 0  Difficulty concentrating or making decisions? 0  Walking or climbing stairs? 0  Dressing or bathing? 0  Doing errands, shopping? 0  Comment family helps her if she needs it  Quarry manager and eating ? N  Using the Toilet? N  In the past six months, have you accidently leaked urine? N  Do you have problems with loss of bowel control? N  Managing your Medications? N  Managing your Finances? N  Housekeeping or managing your Housekeeping? N    Patient Care Team: Billie Lade, MD as PCP - General (Internal Medicine) Mallipeddi, Orion Modest, MD as PCP - Cardiology (Cardiology)  Indicate any recent Medical Services you may have received from other than [facility] providers in the past year (date may be approximate).     Assessment:   This is  a routine wellness examination for Joniah.  Hearing/Vision screen Hearing Screening - Comments:: Patient denies any hearing difficulties.    Dietary issues and exercise activities discussed:     Goals Addressed               This Visit's Progress     Patient Stated (pt-stated)        Patient's goal is to be more active        Depression Screen    12/20/2022    3:23 PM  PHQ 2/9 Scores  PHQ - 2 Score 3  PHQ- 9 Score 6    Fall Risk    12/20/2022    3:27 PM  Fall Risk   Falls in the past year? 0  Number falls in past yr: 0  Injury with Fall? 0  Risk for fall due to : No Fall Risks;Impaired balance/gait;Impaired mobility  Follow up Falls prevention discussed;Education provided    MEDICARE RISK AT HOME:  Medicare Risk at Home - 12/20/22 1526     Any stairs in or around the home? No    If so, are there any without handrails? No    Home free of loose throw rugs in walkways, pet beds, electrical cords, etc? Yes    Adequate lighting in your home to reduce risk of falls? Yes    Life alert? No    Use of a cane, walker or w/c? Yes    Grab bars in the bathroom? Yes    Shower chair or bench in shower? Yes    Elevated toilet seat or a handicapped toilet? Yes             TIMED UP AND GO:  Was the test performed?  No    Cognitive Function:        12/20/2022    3:27 PM  6CIT Screen  What Year? 0 points  What month? 0 points  What time? 0 points  Count back from 20 0 points  Months in reverse 0 points  Repeat phrase 0 points  Total Score 0 points    Immunizations  There is no  immunization history on file for this patient.  TDAP status: Due, Education has been provided regarding the importance of this vaccine. Advised may receive this vaccine at local pharmacy or Health Dept. Aware to provide a copy of the vaccination record if obtained from local pharmacy or Health Dept. Verbalized acceptance and understanding.  Flu Vaccine status: Up to  date  Pneumococcal vaccine status: Due, Education has been provided regarding the importance of this vaccine. Advised may receive this vaccine at local pharmacy or Health Dept. Aware to provide a copy of the vaccination record if obtained from local pharmacy or Health Dept. Verbalized acceptance and understanding.  Covid-19 vaccine status: Information provided on how to obtain vaccines.   Qualifies for Shingles Vaccine? Yes   Zostavax completed No   Shingrix Completed?: No.    Education has been provided regarding the importance of this vaccine. Patient has been advised to call insurance company to determine out of pocket expense if they have not yet received this vaccine. Advised may also receive vaccine at local pharmacy or Health Dept. Verbalized acceptance and understanding.  Screening Tests Health Maintenance  Topic Date Due   DTaP/Tdap/Td (1 - Tdap) Never done   Zoster Vaccines- Shingrix (1 of 2) Never done   Pneumonia Vaccine 22+ Years old (1 of 1 - PCV) Never done   Medicare Annual Wellness (AWV)  12/01/2017   COVID-19 Vaccine (1 - 2023-24 season) Never done   INFLUENZA VACCINE  12/28/2022   DEXA SCAN  Completed   HPV VACCINES  Aged Out    Health Maintenance  Health Maintenance Due  Topic Date Due   DTaP/Tdap/Td (1 - Tdap) Never done   Zoster Vaccines- Shingrix (1 of 2) Never done   Pneumonia Vaccine 23+ Years old (1 of 1 - PCV) Never done   Medicare Annual Wellness (AWV)  12/01/2017   COVID-19 Vaccine (1 - 2023-24 season) Never done    Colorectal cancer screening: No longer required.   Mammogram status: Completed 07/24/2022. Repeat every year  Bone Density status: Completed 03/11/2018. Results reflect: Bone density results: OSTEOPOROSIS. Repeat every 2 years.  Lung Cancer Screening: (Low Dose CT Chest recommended if Age 67-80 years, 20 pack-year currently smoking OR have quit w/in 15years.) does not qualify.   Additional Screening:  Hepatitis C Screening: does not  qualify;   Vision Screening: Recommended annual ophthalmology exams for early detection of glaucoma and other disorders of the eye. Is the patient up to date with their annual eye exam?  Yes  Who is the provider or what is the name of the office in which the patient attends annual eye exams? Dr. Dione Booze If pt is not established with a provider, would they like to be referred to a provider to establish care? No .   Dental Screening: Recommended annual dental exams for proper oral hygiene  Diabetic Foot Exam: n/a  Community Resource Referral / Chronic Care Management: CRR required this visit?  No   CCM required this visit?  No     Plan:     I have personally reviewed and noted the following in the patient's chart:   Medical and social history Use of alcohol, tobacco or illicit drugs  Current medications and supplements including opioid prescriptions. Patient is not currently taking opioid prescriptions. Functional ability and status Nutritional status Physical activity Advanced directives List of other physicians Hospitalizations, surgeries, and ER visits in previous 12 months Vitals Screenings to include cognitive, depression, and falls Referrals and appointments  In addition,  I have reviewed and discussed with patient certain preventive protocols, quality metrics, and best practice recommendations. A written personalized care plan for preventive services as well as general preventive health recommendations were provided to patient.     Jordan Hawks Victorious Kundinger, CMA   12/20/2022   After Visit Summary: (Mail) Due to this being a telephonic visit, the after visit summary with patients personalized plan was offered to patient via mail   Nurse Notes:

## 2022-12-21 ENCOUNTER — Ambulatory Visit (INDEPENDENT_AMBULATORY_CARE_PROVIDER_SITE_OTHER): Payer: PPO | Admitting: Gastroenterology

## 2023-01-04 ENCOUNTER — Other Ambulatory Visit (HOSPITAL_COMMUNITY): Payer: PPO

## 2023-01-09 ENCOUNTER — Ambulatory Visit (HOSPITAL_COMMUNITY)
Admission: RE | Admit: 2023-01-09 | Discharge: 2023-01-09 | Disposition: A | Discharge status: Home / Self Care | Payer: PPO | Source: Ambulatory Visit | Attending: Internal Medicine | Admitting: Internal Medicine

## 2023-01-09 DIAGNOSIS — Z780 Asymptomatic menopausal state: Secondary | ICD-10-CM | POA: Insufficient documentation

## 2023-01-11 ENCOUNTER — Other Ambulatory Visit: Payer: Self-pay

## 2023-01-11 DIAGNOSIS — Z79899 Other long term (current) drug therapy: Secondary | ICD-10-CM

## 2023-02-06 ENCOUNTER — Ambulatory Visit: Payer: PPO | Admitting: Internal Medicine

## 2023-02-12 ENCOUNTER — Encounter: Payer: Self-pay | Admitting: Internal Medicine

## 2023-02-12 ENCOUNTER — Ambulatory Visit (INDEPENDENT_AMBULATORY_CARE_PROVIDER_SITE_OTHER): Payer: PPO | Admitting: Internal Medicine

## 2023-02-12 VITALS — BP 131/80 | HR 88 | Ht 61.0 in | Wt 155.8 lb

## 2023-02-12 DIAGNOSIS — Z23 Encounter for immunization: Secondary | ICD-10-CM | POA: Diagnosis not present

## 2023-02-12 DIAGNOSIS — M810 Age-related osteoporosis without current pathological fracture: Secondary | ICD-10-CM

## 2023-02-12 DIAGNOSIS — N1831 Chronic kidney disease, stage 3a: Secondary | ICD-10-CM | POA: Diagnosis not present

## 2023-02-12 DIAGNOSIS — G4700 Insomnia, unspecified: Secondary | ICD-10-CM | POA: Insufficient documentation

## 2023-02-12 DIAGNOSIS — I10 Essential (primary) hypertension: Secondary | ICD-10-CM

## 2023-02-12 DIAGNOSIS — D649 Anemia, unspecified: Secondary | ICD-10-CM | POA: Diagnosis not present

## 2023-02-12 DIAGNOSIS — F5104 Psychophysiologic insomnia: Secondary | ICD-10-CM

## 2023-02-12 DIAGNOSIS — K219 Gastro-esophageal reflux disease without esophagitis: Secondary | ICD-10-CM

## 2023-02-12 MED ORDER — LORATADINE 10 MG PO TABS: 10.0000 mg | ORAL_TABLET | Freq: Every day | ORAL | 3 refills | Status: DC

## 2023-02-12 NOTE — Assessment & Plan Note (Signed)
T-score -2.6 on DEXA from last month.  She is currently prescribed Prolia and is also taking vitamin D and calcium supplementation. -No medication changes are indicated today.  Continue Prolia as currently prescribed.  Reports that she will have been taking Prolia for 3 years in December. -Repeat vitamin D and calcium levels ordered today

## 2023-02-12 NOTE — Assessment & Plan Note (Signed)
She endorses insomnia, noting that she chronically has had difficulty sleeping.  Currently prescribed alprazolam 0.5 mg nightly as needed, which she seldom uses unless she feels anxious. -I have recommended that she try melatonin nightly to see if this improves symptoms of insomnia

## 2023-02-12 NOTE — Assessment & Plan Note (Signed)
GFR 47 on labs from February.  Repeat CMP ordered today.

## 2023-02-12 NOTE — Assessment & Plan Note (Signed)
Remains adequately controlled without antihypertensive medication.

## 2023-02-12 NOTE — Progress Notes (Signed)
Established Patient Office Visit  Subjective   Patient ID: Stephanie Wood, female    DOB: 1935/03/26  Age: 88 y.o. MRN: [id number]  Chief Complaint  Patient presents with   Immunizations    F/u   Gastroesophageal Reflux    F/u   Ms. Stephanie Wood returns to care today for routine follow-up.  She was last evaluated by me on 5/9 as a new patient presenting to establish care.  No medication changes were made at that time and 49-month follow-up was arranged.  There have been no acute interval events.  Ms. Stephanie Wood reports feeling well today.  She is asymptomatic and has no acute concerns to discuss.  Past Medical History:  Diagnosis Date   Acid reflux disease    Anemia    Anxiety    Colonic adenoma    Depression    Irritable bowel syndrome    Past Surgical History:  Procedure Laterality Date   CATARACT EXTRACTION     COLONOSCOPY N/A 04/15/2014   Procedure: COLONOSCOPY;  Surgeon: Malissa Hippo, MD;  [facility];  Service: Endoscopy;  Laterality: N/A;  730   ESOPHAGOGASTRODUODENOSCOPY  09/20/2011   Procedure: ESOPHAGOGASTRODUODENOSCOPY (EGD);  Surgeon: Malissa Hippo, MD;  [facility];  Service: Endoscopy;  Laterality: N/A;  215   ESOPHAGOGASTRODUODENOSCOPY (EGD) WITH PROPOFOL N/A 03/16/2022   Procedure: ESOPHAGOGASTRODUODENOSCOPY (EGD) WITH PROPOFOL;  Surgeon: Dolores Frame, MD;  [facility];  Service: Gastroenterology;  Laterality: N/A;  1230 ASA 3   EYE SURGERY     bil catatract surgery   Social History   Tobacco Use   Smoking status: Never    Passive exposure: Never   Smokeless tobacco: Never  Vaping Use   Vaping status: Never Used  Substance Use Topics   Alcohol use: No    Alcohol/week: 0.0 standard drinks of alcohol   Drug use: No   Family History  Problem Relation Age of Onset   Prostate cancer Father    Allergies  Allergen Reactions   Drug Class [Trazodone And Nefazodone]    Macrobid [Nitrofurantoin] Other (See Comments)     Elevated LFT's   Review of Systems  Constitutional:  Negative for chills and fever.  HENT:  Negative for sore throat.   Respiratory:  Negative for cough and shortness of breath.   Cardiovascular:  Negative for chest pain, palpitations and leg swelling.  Gastrointestinal:  Negative for abdominal pain, blood in stool, constipation, diarrhea, nausea and vomiting.  Genitourinary:  Negative for dysuria and hematuria.  Musculoskeletal:  Negative for myalgias.  Skin:  Negative for itching and rash.  Neurological:  Negative for dizziness and headaches.  Psychiatric/Behavioral:  Negative for depression and suicidal ideas. The patient has insomnia.      Objective:     BP 131/80   Pulse 88   Ht 5\' 1"  (1.549 m)   Wt 155 lb 12.8 oz (70.7 kg)   SpO2 94%   BMI 29.44 kg/m  BP Readings from Last 3 Encounters:  02/12/23 131/80  12/20/22 116/68  10/05/22 126/70   Physical Exam Vitals reviewed.  Constitutional:      General: She is not in acute distress.    Appearance: Normal appearance. She is not toxic-appearing.  HENT:     Head: Normocephalic and atraumatic.     Right Ear: External ear normal.     Left Ear: External ear normal.     Nose: Nose normal. No congestion or rhinorrhea.  Mouth/Throat:     Mouth: Mucous membranes are moist.     Pharynx: Oropharynx is clear. No oropharyngeal exudate or posterior oropharyngeal erythema.  Eyes:     General: No scleral icterus.    Extraocular Movements: Extraocular movements intact.     Conjunctiva/sclera: Conjunctivae normal.     Pupils: Pupils are equal, round, and reactive to light.  Cardiovascular:     Rate and Rhythm: Normal rate and regular rhythm.     Pulses: Normal pulses.     Heart sounds: Normal heart sounds. No murmur heard.    No friction rub. No gallop.  Pulmonary:     Effort: Pulmonary effort is normal.     Breath sounds: Normal breath sounds. No wheezing, rhonchi or rales.  Abdominal:     General: Abdomen is flat. Bowel  sounds are normal. There is no distension.     Palpations: Abdomen is soft.     Tenderness: There is no abdominal tenderness.  Musculoskeletal:        General: No swelling. Normal range of motion.     Cervical back: Normal range of motion.     Right lower leg: No edema.     Left lower leg: No edema.  Lymphadenopathy:     Cervical: No cervical adenopathy.  Skin:    General: Skin is warm and dry.     Capillary Refill: Capillary refill takes less than 2 seconds.     Coloration: Skin is not jaundiced.  Neurological:     General: No focal deficit present.     Mental Status: She is alert and oriented to person, place, and time.  Psychiatric:        Mood and Affect: Mood normal.        Behavior: Behavior normal.   Last CBC Lab Results  Component Value Date   WBC 7.8 09/09/2021   HGB 12.9 09/09/2021   HCT 39.4 09/09/2021   MCV 85.5 09/09/2021   MCH 28.0 09/09/2021   RDW 13.6 09/09/2021   PLT 185 09/09/2021   Last metabolic panel Lab Results  Component Value Date   GLUCOSE 93 02/02/2021   NA 137 02/02/2021   K 4.8 02/02/2021   CL 100 02/02/2021   CO2 30 02/02/2021   BUN 17 02/02/2021   CREATININE 1.20 (H) 01/10/2022   EGFR 51.0 07/08/2022   CALCIUM 10.1 02/02/2021   PROT 6.7 09/09/2021   ALBUMIN 2.5 (L) 01/11/2021   BILITOT 0.7 09/09/2021   ALKPHOS 301 (H) 01/11/2021   AST 20 09/09/2021   ALT 13 09/09/2021   ANIONGAP 7 01/11/2021     Assessment & Plan:   Problem List Items Addressed This Visit       Essential hypertension    Remains adequately controlled without antihypertensive medication.      Gastroesophageal reflux disease    Symptoms remain adequately controlled with omeprazole.      Osteoporosis    T-score -2.6 on DEXA from last month.  She is currently prescribed Prolia and is also taking vitamin D and calcium supplementation. -No medication changes are indicated today.  Continue Prolia as currently prescribed.  Reports that she will have been taking  Prolia for 3 years in December. -Repeat vitamin D and calcium levels ordered today      CKD (chronic kidney disease) stage 3, GFR 30-59 ml/min (HCC)    GFR 47 on labs from February.  Repeat CMP ordered today.      Need for influenza vaccination    Influenza  vaccine administered today      Insomnia    She endorses insomnia, noting that she chronically has had difficulty sleeping.  Currently prescribed alprazolam 0.5 mg nightly as needed, which she seldom uses unless she feels anxious. -I have recommended that she try melatonin nightly to see if this improves symptoms of insomnia      Return in about 6 months (around 08/12/2023).   Billie Lade, MD

## 2023-02-12 NOTE — Assessment & Plan Note (Signed)
Influenza vaccine administered today.

## 2023-02-12 NOTE — Patient Instructions (Signed)
It was a pleasure to see you today.  Thank you for giving [place] the opportunity to be involved in your care.  Below is a brief recap of your visit and next steps.  We will plan to see you again in 6 months.  Summary No medication changes today I recommend trying melatonin to see if it helps with sleep Repeat labs ordered today Follow up in 6 months

## 2023-02-12 NOTE — Assessment & Plan Note (Signed)
Symptoms remain adequately controlled with omeprazole.

## 2023-02-13 LAB — CBC WITH DIFFERENTIAL/PLATELET
Basophils Absolute: 0 10*3/uL (ref 0.0–0.2)
Basos: 1 %
EOS (ABSOLUTE): 0.1 10*3/uL (ref 0.0–0.4)
Eos: 2 %
Hematocrit: 38.1 % (ref 34.0–46.6)
Hemoglobin: 12.3 g/dL (ref 11.1–15.9)
Immature Grans (Abs): 0 10*3/uL (ref 0.0–0.1)
Immature Granulocytes: 1 %
Lymphocytes Absolute: 1.8 10*3/uL (ref 0.7–3.1)
Lymphs: 44 %
MCH: 28.1 pg (ref 26.6–33.0)
MCHC: 32.3 g/dL (ref 31.5–35.7)
MCV: 87 fL (ref 79–97)
Monocytes Absolute: 0.1 10*3/uL (ref 0.1–0.9)
Monocytes: 3 %
Neutrophils Absolute: 2 10*3/uL (ref 1.4–7.0)
Neutrophils: 49 %
Platelets: 117 10*3/uL — ABNORMAL LOW (ref 150–450)
RBC: 4.38 x10E6/uL (ref 3.77–5.28)
RDW: 15.7 % — ABNORMAL HIGH (ref 11.7–15.4)
WBC: 4 10*3/uL (ref 3.4–10.8)

## 2023-02-13 LAB — CMP14+EGFR
ALT: 14 IU/L (ref 0–32)
AST: 20 IU/L (ref 0–40)
Albumin: 4.2 g/dL (ref 3.7–4.7)
Alkaline Phosphatase: 91 IU/L (ref 44–121)
BUN/Creatinine Ratio: 14 (ref 12–28)
BUN: 16 mg/dL (ref 8–27)
Bilirubin Total: 0.6 mg/dL (ref 0.0–1.2)
CO2: 22 mmol/L (ref 20–29)
Calcium: 9.2 mg/dL (ref 8.7–10.3)
Chloride: 108 mmol/L — ABNORMAL HIGH (ref 96–106)
Creatinine, Ser: 1.13 mg/dL — ABNORMAL HIGH (ref 0.57–1.00)
Globulin, Total: 2.4 g/dL (ref 1.5–4.5)
Glucose: 99 mg/dL (ref 70–99)
Potassium: 4.1 mmol/L (ref 3.5–5.2)
Sodium: 146 mmol/L — ABNORMAL HIGH (ref 134–144)
Total Protein: 6.6 g/dL (ref 6.0–8.5)
eGFR: 47 mL/min/{1.73_m2} — ABNORMAL LOW (ref >59)

## 2023-02-13 LAB — VITAMIN D 25 HYDROXY (VIT D DEFICIENCY, FRACTURES): Vit D, 25-Hydroxy: 64.8 ng/mL (ref 30.0–100.0)

## 2023-02-13 LAB — B12 AND FOLATE PANEL
Folate: 13.1 ng/mL (ref >3.0)
Vitamin B-12: 1062 pg/mL (ref 232–1245)

## 2023-02-13 LAB — TSH+FREE T4
Free T4: 1.4 ng/dL (ref 0.82–1.77)
TSH: 4.23 u[IU]/mL (ref 0.450–4.500)

## 2023-04-10 ENCOUNTER — Telehealth: Payer: Self-pay

## 2023-04-10 NOTE — Telephone Encounter (Signed)
Patient advised.

## 2023-04-10 NOTE — Telephone Encounter (Signed)
Copied from CRM [id number]. Topic: Clinical - Medication Question >> Apr 10, 2023  1:25 PM Almira Coaster wrote: Reason for CRM: Patient is due for her Prolia injection on November 28th. She would like to come into the office to receive it; however, it is Thanksgiving holiday will there be any concerns with having the injection earlier or later then the actually due date.

## 2023-05-01 ENCOUNTER — Ambulatory Visit (INDEPENDENT_AMBULATORY_CARE_PROVIDER_SITE_OTHER): Payer: PPO

## 2023-05-01 DIAGNOSIS — M810 Age-related osteoporosis without current pathological fracture: Secondary | ICD-10-CM

## 2023-05-01 MED ORDER — PROLIA 60 MG/ML ~~LOC~~ SOSY: 60.0000 mg | PREFILLED_SYRINGE | SUBCUTANEOUS | 1 refills | Status: DC

## 2023-05-01 MED ORDER — DENOSUMAB 60 MG/ML ~~LOC~~ SOSY
60.0000 mg | PREFILLED_SYRINGE | Freq: Once | SUBCUTANEOUS | Status: AC
  Administered 2023-05-01: 60 mg via SUBCUTANEOUS

## 2023-05-03 ENCOUNTER — Other Ambulatory Visit: Payer: Self-pay | Admitting: Urology

## 2023-05-08 NOTE — Progress Notes (Deleted)
  History of Present Illness:   Past Medical History:  Diagnosis Date   Acid reflux disease    Anemia    Anxiety    Colonic adenoma    Depression    Irritable bowel syndrome     Past Surgical History:  Procedure Laterality Date   CATARACT EXTRACTION     COLONOSCOPY N/A 04/15/2014   Procedure: COLONOSCOPY;  Surgeon: Malissa Hippo, MD;  [facility];  Service: Endoscopy;  Laterality: N/A;  730   ESOPHAGOGASTRODUODENOSCOPY  09/20/2011   Procedure: ESOPHAGOGASTRODUODENOSCOPY (EGD);  Surgeon: Malissa Hippo, MD;  [facility];  Service: Endoscopy;  Laterality: N/A;  215   ESOPHAGOGASTRODUODENOSCOPY (EGD) WITH PROPOFOL N/A 03/16/2022   Procedure: ESOPHAGOGASTRODUODENOSCOPY (EGD) WITH PROPOFOL;  Surgeon: Dolores Frame, MD;  [facility];  Service: Gastroenterology;  Laterality: N/A;  1230 ASA 3   EYE SURGERY     bil catatract surgery    Home Medications:  (Not in a hospital admission)   Allergies:  Allergies  Allergen Reactions   Drug Class [Trazodone And Nefazodone]    Macrobid [Nitrofurantoin] Other (See Comments)    Elevated LFT's    Family History  Problem Relation Age of Onset   Prostate cancer Father     Social History:  reports that she has never smoked. She has never been exposed to tobacco smoke. She has never used smokeless tobacco. She reports that she does not drink alcohol and does not use drugs.  ROS: A complete review of systems was performed.  All systems are negative except for pertinent findings as noted.  Physical Exam:  Vital signs in last 24 hours: @VSRANGES @ General:  Alert and oriented, No acute distress HEENT: Normocephalic, atraumatic Neck: No JVD or lymphadenopathy Cardiovascular: Regular rate  Lungs: Normal inspiratory/expiratory excursion Neurologic: Grossly intact  I have reviewed prior pt notes  I have reviewed urinalysis results  I have reviewed prior urine culture    Impression/Assessment:  1.  History of recurrent urinary tract infections, none recently on suppression with methenamine  2.  Postmenopausal atrophic vaginal changes, not on regular use of estrogen cream  Plan:  1.  I did recommend she use estrogen cream at least 1 night a week  2.  Continue methenamine  3.  I will have her come back in 1 year for routine check  Stephanie Wood 05/08/2023, 12:31 PM  Bertram Millard. Stachia Slutsky MD

## 2023-05-15 ENCOUNTER — Encounter: Payer: Self-pay | Admitting: Internal Medicine

## 2023-05-15 ENCOUNTER — Ambulatory Visit: Payer: PPO | Admitting: Urology

## 2023-05-15 ENCOUNTER — Ambulatory Visit: Payer: PPO | Attending: Internal Medicine | Admitting: Internal Medicine

## 2023-05-15 VITALS — BP 138/84 | HR 80 | Ht 61.0 in | Wt 158.8 lb

## 2023-05-15 DIAGNOSIS — I5033 Acute on chronic diastolic (congestive) heart failure: Secondary | ICD-10-CM

## 2023-05-15 DIAGNOSIS — M7989 Other specified soft tissue disorders: Secondary | ICD-10-CM

## 2023-05-15 DIAGNOSIS — N952 Postmenopausal atrophic vaginitis: Secondary | ICD-10-CM

## 2023-05-15 DIAGNOSIS — N390 Urinary tract infection, site not specified: Secondary | ICD-10-CM

## 2023-05-15 DIAGNOSIS — Z79899 Other long term (current) drug therapy: Secondary | ICD-10-CM

## 2023-05-15 DIAGNOSIS — I10 Essential (primary) hypertension: Secondary | ICD-10-CM

## 2023-05-15 DIAGNOSIS — I5032 Chronic diastolic (congestive) heart failure: Secondary | ICD-10-CM | POA: Insufficient documentation

## 2023-05-15 MED ORDER — FUROSEMIDE 20 MG PO TABS: 20.0000 mg | ORAL_TABLET | Freq: Every day | ORAL | Status: DC

## 2023-05-15 NOTE — Patient Instructions (Signed)
Medication Instructions:  Your physician has recommended you make the following change in your medication:  Start Lasix 20 mg once daily  Continue taking all other medications as prescribed  Labwork: BMET in one week at [facility]  Testing/Procedures: None  Follow-Up: Your physician recommends that you schedule a follow-up appointment in: 10 months  Any Other Special Instructions Will Be Listed Below (If Applicable).  Thank you for choosing [facility]!      If you need a refill on your cardiac medications before your next appointment, please call your pharmacy.

## 2023-05-15 NOTE — Progress Notes (Signed)
Cardiology Office Note  Date: 05/15/2023   ID: Stephanie Wood, DOB Sep 08, 1934, MRN [id number]  PCP:  Billie Lade, MD  Cardiologist:  Marjo Bicker, MD Electrophysiologist:  None    History of Present Illness: Stephanie Wood is a 88 y.o. female known to have HTN, HLD, IBS, CKD stage IIIb, chronic regurgitation from hiatal hernia is here for follow-up visit.  She  Patient has a family history of ASCVD (but not premature). Her brother had open heart surgery, triple vessel bypass in his late 80s and eventually passed away.  Her older brother had heart attack in his 41s. She hence wanted to check her heart out.  NM stress test in 1/24 showed no evidence of ischemia.  Echocardiogram in 12/23 showed normal LVEF, elevated LVEDP with G1 DD, mild MR.  Patient is here for follow-up visit, accompanied by daughter.  She reported having SOB with walking (minimal to mild) but no orthopnea or PND.  No leg swelling.  She does have some ankle swelling intermittently.  No leg swelling.  At rest, she had to take a deep breath often.  Provided her with a 30-day supply of p.o. Lasix 20 mg in the last clinic visit, she took 16 pills so far.  No chest pains, dizziness, palpitations, syncope.  I reviewed her home BP that showed BP within the range of 110 to 140 mmHg SBP.  She was gaining weight since the first week of December 2024.  She went up from 151 pounds to 155 pounds.  Past Medical History:  Diagnosis Date   Acid reflux disease    Anemia    Anxiety    Colonic adenoma    Depression    Irritable bowel syndrome     Past Surgical History:  Procedure Laterality Date   CATARACT EXTRACTION     COLONOSCOPY N/A 04/15/2014   Procedure: COLONOSCOPY;  Surgeon: Malissa Hippo, MD;  [facility];  Service: Endoscopy;  Laterality: N/A;  730   ESOPHAGOGASTRODUODENOSCOPY  09/20/2011   Procedure: ESOPHAGOGASTRODUODENOSCOPY (EGD);  Surgeon: Malissa Hippo, MD;  [facility];  Service:  Endoscopy;  Laterality: N/A;  215   ESOPHAGOGASTRODUODENOSCOPY (EGD) WITH PROPOFOL N/A 03/16/2022   Procedure: ESOPHAGOGASTRODUODENOSCOPY (EGD) WITH PROPOFOL;  Surgeon: Dolores Frame, MD;  [facility];  Service: Gastroenterology;  Laterality: N/A;  1230 ASA 3   EYE SURGERY     bil catatract surgery    Current Outpatient Medications  Medication Sig Dispense Refill   ALPRAZolam (XANAX) 0.5 MG tablet Take 0.5 mg by mouth at bedtime.     atorvastatin (LIPITOR) 20 MG tablet Take 20 mg by mouth daily.     bifidobacterium infantis (ALIGN) capsule Take 1 capsule by mouth daily.     Calcium Carbonate (CALCIUM 600 PO) Take 600 mg by mouth daily.     dicyclomine (BENTYL) 10 MG capsule Take 1 capsule (10 mg total) by mouth every 12 (twelve) hours as needed for spasms (abdominal pain). 90 capsule 1   ELDERBERRY PO Take 1 capsule by mouth daily.     furosemide (LASIX) 20 MG tablet Take 1 tablet (20 mg total) by mouth as needed for edema (swelling or shortness of breath).     loperamide (IMODIUM) 2 MG capsule Take 2 mg by mouth as needed for diarrhea or loose stools. 30 capsule    loratadine (CLARITIN) 10 MG tablet Take 1 tablet (10 mg total) by mouth daily. 90 tablet 3  methenamine (HIPREX) 1 g tablet TAKE 1 TABLET BY MOUTH TWICE DAILY. 90 tablet 8   omeprazole (PRILOSEC) 40 MG capsule Take 1 capsule (40 mg total) by mouth daily. 90 capsule 3   Pediatric Multivitamins-Iron (FLINTSTONES PLUS IRON) chewable tablet Chew 1 tablet by mouth 2 (two) times a week.     PROLIA 60 MG/ML SOSY injection Inject 60 mg into the skin every 6 (six) months. 1 mL 1   vitamin B-12 (CYANOCOBALAMIN) 1000 MCG tablet Take 1,000 mcg by mouth daily.     No current facility-administered medications for this visit.   Allergies:  Drug class [trazodone and nefazodone] and Macrobid [nitrofurantoin]   Social History: The patient  reports that she has never smoked. She has never been exposed to tobacco smoke.  She has never used smokeless tobacco. She reports that she does not drink alcohol and does not use drugs.   Family History: The patient's family history includes Prostate cancer in her father.   ROS:  Please see the history of present illness. Otherwise, complete review of systems is positive for none.  All other systems are reviewed and negative.   Physical Exam: VS:  Ht 5\' 1"  (1.549 m)   Wt 158 lb 12.8 oz (72 kg)   BMI 30.00 kg/m , BMI Body mass index is 30 kg/m.  Wt Readings from Last 3 Encounters:  05/15/23 158 lb 12.8 oz (72 kg)  02/12/23 155 lb 12.8 oz (70.7 kg)  12/20/22 153 lb 11.2 oz (69.7 kg)    General: Patient appears comfortable at rest. HEENT: Conjunctiva and lids normal, oropharynx clear with moist mucosa. Neck: Supple, no elevated JVP or carotid bruits, no thyromegaly. Lungs: Clear to auscultation, nonlabored breathing at rest. Cardiac: Regular rate and rhythm, no S3 or significant systolic murmur, no pericardial rub. Abdomen: Soft, nontender, no hepatomegaly, bowel sounds present, no guarding or rebound. Extremities: No pitting edema, distal pulses 2+. Skin: Warm and dry. Musculoskeletal: No kyphosis. Neuropsychiatric: Alert and oriented x3, affect grossly appropriate.  ECG:    Recent Labwork: 02/12/2023: ALT 14; AST 20; BUN 16; Creatinine, Ser 1.13; Hemoglobin 12.3; Platelets 117; Potassium 4.1; Sodium 146; TSH 4.230  No results found for: "CHOL", "TRIG", "HDL", "CHOLHDL", "VLDL", "LDLCALC", "LDLDIRECT"  Other Studies Reviewed Today: I personally reviewed the heart care referral records from the PCP  Assessment and Plan:   Acute on chronic diastolic heart failure : Symptoms of SOB at rest and exertion (with no orthopnea or PND or leg swelling) associated with weight gain (151 to 155 pounds since April 29, 2023).  Start p.o. Lasix 20 mg once daily and obtain BMP in 5 days.  She has underlying CKD, stable, recent serum creatinine was 1.1 with GFR  47.  Family history of ASCVD, not premature: She had her brother diagnosed with CAD and underwent triple-vessel bypass in his late 44s.  Lexiscan in 05/2022 showed no evidence of ischemia.  Echocardiogram in 12/23 showed normal LVEF, G1 DD with elevated LVEDP and mild MR.    Medication Adjustments/Labs and Tests Ordered: Current medicines are reviewed at length with the patient today.  Concerns regarding medicines are outlined above.   Tests Ordered: Orders Placed This Encounter  Procedures   EKG 12-Lead    Medication Changes: No orders of the defined types were placed in this encounter.   Disposition:  Follow up 10 months  Signed Jacora Hopkins Verne Spurr, MD, 05/15/2023 11:57 AM    [facility] at [facility] [address] [place],  [place], [place] [address]

## 2023-05-18 ENCOUNTER — Other Ambulatory Visit: Payer: Self-pay | Admitting: Internal Medicine

## 2023-05-18 MED ORDER — FUROSEMIDE 20 MG PO TABS: 20.0000 mg | ORAL_TABLET | Freq: Every day | ORAL | 4 refills | Status: DC

## 2023-05-23 LAB — BASIC METABOLIC PANEL
BUN/Creatinine Ratio: 15 (ref 12–28)
BUN: 19 mg/dL (ref 8–27)
CO2: 26 mmol/L (ref 20–29)
Calcium: 9.7 mg/dL (ref 8.7–10.3)
Chloride: 104 mmol/L (ref 96–106)
Creatinine, Ser: 1.28 mg/dL — ABNORMAL HIGH (ref 0.57–1.00)
Glucose: 99 mg/dL (ref 70–99)
Potassium: 4.3 mmol/L (ref 3.5–5.2)
Sodium: 143 mmol/L (ref 134–144)
eGFR: 41 mL/min/{1.73_m2} — ABNORMAL LOW (ref >59)

## 2023-05-25 ENCOUNTER — Telehealth: Payer: Self-pay | Admitting: Internal Medicine

## 2023-05-25 NOTE — Telephone Encounter (Signed)
*  STAT* If patient is at the pharmacy, call can be transferred to refill team.   1. Which medications need to be refilled? (please list name of each medication and dose if known)   furosemide (LASIX) 20 MG tablet   2. Would you like to learn more about the convenience, safety, & potential cost savings by using the [facility]?   3. Are you open to using the [facility] (Type [facility]. ).  4. Which pharmacy/location (including street and city if local pharmacy) is medication to be sent to?  [facility] - [place], [place] - [address]   5. Do they need a 30 day or 90 day supply?   90 day  Patient stated she has 4-5 days left of this medication.

## 2023-05-28 MED ORDER — FUROSEMIDE 20 MG PO TABS: 20.0000 mg | ORAL_TABLET | Freq: Every day | ORAL | 1 refills | Status: DC

## 2023-05-28 NOTE — Telephone Encounter (Signed)
Pt calling to f/u on refill request from 12/27. Pt is out of medication. Please advise

## 2023-05-28 NOTE — Telephone Encounter (Signed)
Medication has been sent to pharmacy.  °

## 2023-05-31 ENCOUNTER — Telehealth: Payer: Self-pay | Admitting: *Deleted

## 2023-05-31 NOTE — Telephone Encounter (Signed)
 Patient informed and verbalized understanding of plan. Copy sent to PCP

## 2023-05-31 NOTE — Telephone Encounter (Signed)
-----   Message from Almarie Crate sent at 05/31/2023  3:35 PM EST ----- Genna to refill. Slight increase in serum creatinine d/t Lasix  use. Recommend holding lasix  for 1 day, then restarting Lasix . She also needs to stay well hydrated.   Recommend low sodium diet, fluid restriction <2L, and daily weights encouraged. Recommend to contact our office for weight gain of 2 lbs overnight or 5 lbs in one week.  Thanks! Will forward note over to attending cardiologist and DOD.   Best,  Almarie Crate, NP

## 2023-06-07 DIAGNOSIS — I739 Peripheral vascular disease, unspecified: Secondary | ICD-10-CM | POA: Diagnosis not present

## 2023-06-10 ENCOUNTER — Other Ambulatory Visit (INDEPENDENT_AMBULATORY_CARE_PROVIDER_SITE_OTHER): Payer: Self-pay | Admitting: Gastroenterology

## 2023-06-10 DIAGNOSIS — R1110 Vomiting, unspecified: Secondary | ICD-10-CM

## 2023-07-02 ENCOUNTER — Ambulatory Visit: Payer: Self-pay | Admitting: Internal Medicine

## 2023-07-02 ENCOUNTER — Encounter: Payer: Self-pay | Admitting: Internal Medicine

## 2023-07-02 ENCOUNTER — Ambulatory Visit (INDEPENDENT_AMBULATORY_CARE_PROVIDER_SITE_OTHER): Payer: PPO | Admitting: Internal Medicine

## 2023-07-02 VITALS — BP 93/62 | HR 113 | Temp 98.7°F | Ht 63.0 in | Wt 154.0 lb

## 2023-07-02 DIAGNOSIS — I5032 Chronic diastolic (congestive) heart failure: Secondary | ICD-10-CM

## 2023-07-02 DIAGNOSIS — N390 Urinary tract infection, site not specified: Secondary | ICD-10-CM | POA: Diagnosis not present

## 2023-07-02 DIAGNOSIS — R509 Fever, unspecified: Secondary | ICD-10-CM | POA: Diagnosis not present

## 2023-07-02 NOTE — Progress Notes (Signed)
Acute Office Visit  Subjective:    Patient ID: Stephanie Wood, female    DOB: [birth year], 89 y.o.   MRN: [id number]  Chief Complaint  Patient presents with   Fever    Pt reports fevers ongoing for 2 nights reading was 101.0, did an at home covid test and it was neg.     HPI Patient is in today for c/o fever and chills for the last 2 nights, up to 101 F. She tried taking Tylenol and resting, which improved her fever.  She has chronic nasal congestion, likely due to allergic sinusitis.  Denies recent worsening of nasal congestion, postnasal drip, sore throat or cough.  Denies any recent sick contacts.  Of note, she has history of recurrent UTI and takes Hiprex for it.  Denies dysuria or hematuria currently.  She also asked about Lasix as her dose of Lasix was recently decreased.  Her BP is low normal currently.  Denies dizziness, chest pain or dyspnea currently.  Past Medical History:  Diagnosis Date   Acid reflux disease    Anemia    Anxiety    Colonic adenoma    Depression    Irritable bowel syndrome     Past Surgical History:  Procedure Laterality Date   CATARACT EXTRACTION     COLONOSCOPY N/A 04/15/2014   Procedure: COLONOSCOPY;  Surgeon: Malissa Hippo, MD;  [facility];  Service: Endoscopy;  Laterality: N/A;  730   ESOPHAGOGASTRODUODENOSCOPY  09/20/2011   Procedure: ESOPHAGOGASTRODUODENOSCOPY (EGD);  Surgeon: Malissa Hippo, MD;  [facility];  Service: Endoscopy;  Laterality: N/A;  215   ESOPHAGOGASTRODUODENOSCOPY (EGD) WITH PROPOFOL N/A 03/16/2022   Procedure: ESOPHAGOGASTRODUODENOSCOPY (EGD) WITH PROPOFOL;  Surgeon: Dolores Frame, MD;  [facility];  Service: Gastroenterology;  Laterality: N/A;  1230 ASA 3   EYE SURGERY     bil catatract surgery    Family History  Problem Relation Age of Onset   Prostate cancer Father     Social History   Socioeconomic History   Marital status: Widowed    Spouse name: Not on file    Number of children: 1   Years of education: Not on file   Highest education level: Not on file  Occupational History   Occupation: reitred  Tobacco Use   Smoking status: Never    Passive exposure: Never   Smokeless tobacco: Never  Vaping Use   Vaping status: Never Used  Substance and Sexual Activity   Alcohol use: No    Alcohol/week: 0.0 standard drinks of alcohol   Drug use: No   Sexual activity: Not on file  Other Topics Concern   Not on file  Social History Narrative   Not on file   Social Drivers of Health   Financial Resource Strain: Low Risk  (12/20/2022)   Overall Financial Resource Strain (CARDIA)    Difficulty of Paying Living Expenses: Not hard at all  Food Insecurity: No Food Insecurity (12/20/2022)   Hunger Vital Sign    Worried About Running Out of Food in the Last Year: Never true    Ran Out of Food in the Last Year: Never true  Transportation Needs: No Transportation Needs (12/20/2022)   PRAPARE - Administrator, Civil Service (Medical): No    Lack of Transportation (Non-Medical): No  Physical Activity: Sufficiently Active (12/20/2022)   Exercise Vital Sign    Days of Exercise per Week: 7 days    Minutes  of Exercise per Session: 30 min  Stress: No Stress Concern Present (12/20/2022)   [employer] of Occupational Health - Occupational Stress Questionnaire    Feeling of Stress : Not at all  Social Connections: Socially Integrated (12/20/2022)   Social Connection and Isolation Panel [NHANES]    Frequency of Communication with Friends and Family: More than three times a week    Frequency of Social Gatherings with Friends and Family: More than three times a week    Attends Religious Services: More than 4 times per year    Active Member of [employer] or Organizations: Yes    Attends Engineer, structural: More than 4 times per year    Marital Status: Married  Catering manager Violence: Not At Risk (12/20/2022)   Humiliation, Afraid, Rape, and  Kick questionnaire    Fear of Current or Ex-Partner: No    Emotionally Abused: No    Physically Abused: No    Sexually Abused: No    Outpatient Medications Prior to Visit  Medication Sig Dispense Refill   ALPRAZolam (XANAX) 0.5 MG tablet Take 0.5 mg by mouth at bedtime.     atorvastatin (LIPITOR) 20 MG tablet Take 20 mg by mouth daily.     bifidobacterium infantis (ALIGN) capsule Take 1 capsule by mouth daily.     Calcium Carbonate (CALCIUM 600 PO) Take 600 mg by mouth daily.     dicyclomine (BENTYL) 10 MG capsule Take 1 capsule (10 mg total) by mouth every 12 (twelve) hours as needed for spasms (abdominal pain). 90 capsule 1   ELDERBERRY PO Take 1 capsule by mouth daily.     furosemide (LASIX) 20 MG tablet Take 1 tablet (20 mg total) by mouth daily. 90 tablet 1   loperamide (IMODIUM) 2 MG capsule Take 2 mg by mouth as needed for diarrhea or loose stools. 30 capsule    loratadine (CLARITIN) 10 MG tablet Take 1 tablet (10 mg total) by mouth daily. 90 tablet 3   methenamine (HIPREX) 1 g tablet TAKE 1 TABLET BY MOUTH TWICE DAILY. 90 tablet 8   omeprazole (PRILOSEC) 40 MG capsule TAKE 1 CAPSULE BY MOUTH DAILY 90 capsule 3   Pediatric Multivitamins-Iron (FLINTSTONES PLUS IRON) chewable tablet Chew 1 tablet by mouth 2 (two) times a week.     PROLIA 60 MG/ML SOSY injection Inject 60 mg into the skin every 6 (six) months. 1 mL 1   vitamin B-12 (CYANOCOBALAMIN) 1000 MCG tablet Take 1,000 mcg by mouth daily.     No facility-administered medications prior to visit.    Allergies  Allergen Reactions   Drug Class [Trazodone And Nefazodone]    Macrobid [Nitrofurantoin] Other (See Comments)    Elevated LFT's    Review of Systems  Constitutional:  Positive for chills and fever.  HENT:  Positive for congestion. Negative for postnasal drip and sore throat.   Eyes:  Negative for pain and discharge.  Respiratory:  Negative for cough and shortness of breath.   Cardiovascular:  Negative for chest  pain and palpitations.  Gastrointestinal:  Negative for diarrhea, nausea and vomiting.  Endocrine: Negative for polydipsia and polyuria.  Genitourinary:  Negative for dysuria and hematuria.  Musculoskeletal:  Negative for neck pain and neck stiffness.  Skin:  Negative for rash.  Neurological:  Negative for dizziness and weakness.  Psychiatric/Behavioral:  Negative for agitation and behavioral problems.        Objective:    Physical Exam Vitals reviewed.  Constitutional:  General: She is not in acute distress.    Appearance: She is not diaphoretic.  HENT:     Head: Normocephalic and atraumatic.     Nose: Congestion present.     Mouth/Throat:     Mouth: Mucous membranes are moist.  Eyes:     General: No scleral icterus.    Extraocular Movements: Extraocular movements intact.  Cardiovascular:     Rate and Rhythm: Normal rate and regular rhythm.     Heart sounds: Normal heart sounds. No murmur heard. Pulmonary:     Breath sounds: Normal breath sounds. No wheezing or rales.  Musculoskeletal:     Cervical back: Neck supple. No tenderness.     Right lower leg: No edema.     Left lower leg: No edema.  Skin:    General: Skin is warm.     Findings: No rash.  Neurological:     General: No focal deficit present.     Mental Status: She is alert and oriented to person, place, and time.  Psychiatric:        Mood and Affect: Mood normal.        Behavior: Behavior normal.     BP 93/62   Pulse (!) 113   Temp 98.7 F (37.1 C)   Ht 5\' 3"  (1.6 m)   Wt 154 lb (69.9 kg)   SpO2 93%   BMI 27.28 kg/m  Wt Readings from Last 3 Encounters:  07/02/23 154 lb (69.9 kg)  05/15/23 158 lb 12.8 oz (72 kg)  02/12/23 155 lb 12.8 oz (70.7 kg)        Assessment & Plan:   Problem List Items Addressed This Visit       Cardiovascular and Mediastinum   Chronic diastolic heart failure (HCC)   Followed by cardiology Appears euvolemic currently Advised to avoid Lasix if BP less than  100/60         Genitourinary   Recurrent UTI - Primary   Followed by urology, takes Hiprex Currently denies dysuria Considering unknown reason for fever and chills, check UA with reflex culture Advised to maintain at least 50 ounces of fluid intake      Relevant Orders   UA/M w/rflx Culture, Routine     Other   Fever and chills   Unclear etiology, but would like to rule out UTI first Also check flu, COVID and RSV testing as she also has mild myalgias Tylenol as needed for fever Maintain adequate hydration, avoid taking Lasix if BP less than 100/60      Relevant Orders   COVID-19, Flu A+B and RSV     No orders of the defined types were placed in this encounter.    Anabel Halon, MD

## 2023-07-02 NOTE — Assessment & Plan Note (Signed)
Followed by urology, takes Hiprex Currently denies dysuria Considering unknown reason for fever and chills, check UA with reflex culture Advised to maintain at least 50 ounces of fluid intake

## 2023-07-02 NOTE — Assessment & Plan Note (Signed)
Followed by cardiology Appears euvolemic currently Advised to avoid Lasix if BP less than 100/60

## 2023-07-02 NOTE — Telephone Encounter (Signed)
Chief Complaint: Fever Symptoms: Fever of 101.3, fatigue Frequency: comes and goes  Pertinent Negatives: Patient denies urinary symptoms, pain, covid or flu exposure  Disposition: [] ED /[] Urgent Care (no appt availability in office) / [x] Appointment(In office/virtual)/ []  [place] [facility]/ [] Home Care/ [] Refused Recommended Disposition /[] [place] [facility]/ []  Follow-up with PCP Additional Notes: Patient's daughter Lynden Ang calling to report the patient has had a fever since Saturday. Patient is taking tylenol to treat the fever but it returns when the tylenol wears off. Patient latest temp was 101.3 took some tylenol and it is now 99.8. Patient denies others symptoms but feels fatigue. Home test was negative for Covid and the Flu. Care advice given and patient has been scheduled today for evaluation.   Copied from CRM [id number]. Topic: Clinical - Red Word Triage >> Jul 02, 2023  2:16 PM Monisha R wrote: [facility] that prompted transfer to Nurse Triage:  Patient has had temperature of 102 off and on these past few nights. No other issues just temperature. Took COVID  home test and was negative Reason for Disposition  Fever present > 3 days (72 hours)  Answer Assessment - Initial Assessment Questions 1. TEMPERATURE: "What is the most recent temperature?"  "How was it measured?"      101.3 2. ONSET: "When did the fever start?"      Saturday night 3. CHILLS: "Do you have chills?" If yes: "How bad are they?"  (e.g., none, mild, moderate, severe)   - NONE: no chills   - MILD: feeling cold   - MODERATE: feeling very cold, some shivering (feels better under a thick blanket)   - SEVERE: feeling extremely cold with shaking chills (general body shaking, rigors; even under a thick blanket)      Yes, mild to moderate 4. OTHER SYMPTOMS: "Do you have any other symptoms besides the fever?"  (e.g., abdomen pain, cough, diarrhea, earache, headache, sore throat, urination pain)     No 5. CAUSE: If  there are no symptoms, ask: "What do you think is causing the fever?"      Unsure 6. CONTACTS: "Does anyone else in the family have an infection?"     No  7. TREATMENT: "What have you done so far to treat this fever?" (e.g., medications)     Tylenol  8. IMMUNOCOMPROMISE: "Do you have of the following: diabetes, HIV positive, splenectomy, cancer chemotherapy, chronic steroid treatment, transplant patient, etc."     No  10. TRAVEL: "Have you traveled out of the country in the last month?" (e.g., travel history, exposures)       No  Protocols used: [facility]

## 2023-07-02 NOTE — Patient Instructions (Signed)
Please take Tylenol as needed for fever and myalgias.  Please maintain adequate hydration by taking about 50 ounces of fluid in a day.

## 2023-07-02 NOTE — Assessment & Plan Note (Signed)
Unclear etiology, but would like to rule out UTI first Also check flu, COVID and RSV testing as she also has mild myalgias Tylenol as needed for fever Maintain adequate hydration, avoid taking Lasix if BP less than 100/60

## 2023-07-04 ENCOUNTER — Other Ambulatory Visit: Payer: Self-pay

## 2023-07-04 DIAGNOSIS — N390 Urinary tract infection, site not specified: Secondary | ICD-10-CM

## 2023-07-04 LAB — UA/M W/RFLX CULTURE, ROUTINE

## 2023-07-04 LAB — COVID-19, FLU A+B AND RSV
Influenza A, NAA: NOT DETECTED
Influenza B, NAA: NOT DETECTED
RSV, NAA: NOT DETECTED
SARS-CoV-2, NAA: NOT DETECTED

## 2023-07-04 MED ORDER — AMOXICILLIN-POT CLAVULANATE 875-125 MG PO TABS: 1.0000 | ORAL_TABLET | Freq: Two times a day (BID) | ORAL | 0 refills | Status: DC

## 2023-07-05 LAB — UA/M W/RFLX CULTURE, ROUTINE
Bilirubin, UA: NEGATIVE
Glucose, UA: NEGATIVE
Ketones, UA: NEGATIVE
Leukocytes,UA: NEGATIVE
Nitrite, UA: NEGATIVE
Protein,UA: NEGATIVE
RBC, UA: NEGATIVE
Specific Gravity, UA: 1.008 (ref 1.005–1.030)
Urobilinogen, Ur: 0.2 mg/dL (ref 0.2–1.0)
pH, UA: 6 (ref 5.0–7.5)

## 2023-07-05 LAB — MICROSCOPIC EXAMINATION
Bacteria, UA: NONE SEEN
Casts: NONE SEEN /[LPF]
RBC, Urine: NONE SEEN /[HPF] (ref 0–2)

## 2023-07-05 LAB — URINALYSIS
Bilirubin, UA: NEGATIVE
Glucose, UA: NEGATIVE
Ketones, UA: NEGATIVE
Leukocytes,UA: NEGATIVE
Nitrite, UA: NEGATIVE
Protein,UA: NEGATIVE
Specific Gravity, UA: 1.005 (ref 1.005–1.030)
Urobilinogen, Ur: 0.2 mg/dL (ref 0.2–1.0)
pH, UA: 6 (ref 5.0–7.5)

## 2023-07-09 DIAGNOSIS — X32XXXD Exposure to sunlight, subsequent encounter: Secondary | ICD-10-CM | POA: Diagnosis not present

## 2023-07-09 DIAGNOSIS — L304 Erythema intertrigo: Secondary | ICD-10-CM | POA: Diagnosis not present

## 2023-07-09 DIAGNOSIS — L570 Actinic keratosis: Secondary | ICD-10-CM | POA: Diagnosis not present

## 2023-07-09 DIAGNOSIS — D225 Melanocytic nevi of trunk: Secondary | ICD-10-CM | POA: Diagnosis not present

## 2023-07-09 DIAGNOSIS — Z1283 Encounter for screening for malignant neoplasm of skin: Secondary | ICD-10-CM | POA: Diagnosis not present

## 2023-07-10 ENCOUNTER — Ambulatory Visit (INDEPENDENT_AMBULATORY_CARE_PROVIDER_SITE_OTHER): Payer: PPO | Admitting: Gastroenterology

## 2023-07-10 ENCOUNTER — Telehealth (INDEPENDENT_AMBULATORY_CARE_PROVIDER_SITE_OTHER): Payer: Self-pay

## 2023-07-10 ENCOUNTER — Encounter (INDEPENDENT_AMBULATORY_CARE_PROVIDER_SITE_OTHER): Payer: Self-pay | Admitting: Gastroenterology

## 2023-07-10 VITALS — BP 109/77 | HR 108 | Temp 97.3°F | Ht 63.0 in | Wt 151.7 lb

## 2023-07-10 DIAGNOSIS — R195 Other fecal abnormalities: Secondary | ICD-10-CM | POA: Diagnosis not present

## 2023-07-10 NOTE — Progress Notes (Addendum)
Referring Provider: Billie Lade, MD Primary Care Physician:  Billie Lade, MD Primary GI Physician: Dr. Levon Hedger   Chief Complaint  Patient presents with   dark stools    Noticed dark stools today. Having loose stools. Took bentyl and states it did help. Pain on LLQ off and on for years. Having some dizziness that has been for awhile.    HPI:   Stephanie Wood is a 89 y.o. 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 and had a very dark stool, though not sure these were actually black. She takes a daily flintstone vitamin that she has been taking for years. She also reports eating more turnip greens recently. She has had LLQ pain off and on for a few years and notes she felt this pain this morning but pain was not worse than normal. She takes bentyl for this PRN which helps though she notes she does not require this very often. She notes that she has had multiple looser stools this morning as well that are dark, though loose stools are baseline for her due to her IBS. She denies BRBPR. She has not taken any pepto bismol. Denies any nausea or vomiting. Denies fevers or chills. She denies sob, has some dizziness. She does not take any NSAIDs   Last cbc in September with hgb of 12.3   esophagram in 2020:  Very large hiatal hernia with greater than 50% of stomach in the lower chest.  Moderate diffuse esophageal dysmotility with prolonged thoracic retention of contrast. No laryngeal penetration or aspiration. No esophageal mass or stricture.   Last EGD: 02/2022 6 centimeter hiatal hernia, normal stomach and duodenum   Last Colonoscopy: 2015 Examination performed to cecum. No evidence of recurrent polyps. Single small diverticulum at sigmoid colon. Small external hemorrhoids and anal papillae.    Past Medical  History:  Diagnosis Date   Acid reflux disease    Anemia    Anxiety    Colonic adenoma    Depression    Irritable bowel syndrome     Past Surgical History:  Procedure Laterality Date   CATARACT EXTRACTION     COLONOSCOPY N/A 04/15/2014   Procedure: COLONOSCOPY;  Surgeon: Malissa Hippo, MD;  [facility];  Service: Endoscopy;  Laterality: N/A;  730   ESOPHAGOGASTRODUODENOSCOPY  09/20/2011   Procedure: ESOPHAGOGASTRODUODENOSCOPY (EGD);  Surgeon: Malissa Hippo, MD;  [facility];  Service: Endoscopy;  Laterality: N/A;  215   ESOPHAGOGASTRODUODENOSCOPY (EGD) WITH PROPOFOL N/A 03/16/2022   Procedure: ESOPHAGOGASTRODUODENOSCOPY (EGD) WITH PROPOFOL;  Surgeon: Dolores Frame, MD;  [facility];  Service: Gastroenterology;  Laterality: N/A;  1230 ASA 3   EYE SURGERY     bil catatract surgery    Current Outpatient Medications  Medication Sig Dispense Refill   ALPRAZolam (XANAX) 0.5 MG tablet Take 0.5 mg by mouth at bedtime.     atorvastatin (LIPITOR) 20 MG tablet Take 20 mg by mouth daily.     bifidobacterium infantis (ALIGN) capsule Take 1 capsule by mouth daily.     Calcium Carbonate (CALCIUM 600 PO) Take 600 mg by mouth daily.     dicyclomine (BENTYL) 10 MG capsule Take 1 capsule (10 mg total) by mouth every 12 (twelve) hours as needed for spasms (abdominal pain). 90 capsule 1  ELDERBERRY PO Take 1 capsule by mouth daily.     furosemide (LASIX) 20 MG tablet Take 1 tablet (20 mg total) by mouth daily. 90 tablet 1   loperamide (IMODIUM) 2 MG capsule Take 2 mg by mouth as needed for diarrhea or loose stools. 30 capsule    loratadine (CLARITIN) 10 MG tablet Take 1 tablet (10 mg total) by mouth daily. 90 tablet 3   methenamine (HIPREX) 1 g tablet TAKE 1 TABLET BY MOUTH TWICE DAILY. 90 tablet 8   omeprazole (PRILOSEC) 40 MG capsule TAKE 1 CAPSULE BY MOUTH DAILY 90 capsule 3   Pediatric Multivitamins-Iron (FLINTSTONES PLUS IRON) chewable tablet  Chew 1 tablet by mouth 2 (two) times a week.     PROLIA 60 MG/ML SOSY injection Inject 60 mg into the skin every 6 (six) months. 1 mL 1   vitamin B-12 (CYANOCOBALAMIN) 1000 MCG tablet Take 1,000 mcg by mouth daily.     No current facility-administered medications for this visit.    Allergies as of 07/10/2023 - Review Complete 07/10/2023  Allergen Reaction Noted   Drug class [trazodone and nefazodone]  06/29/2020   Macrobid [nitrofurantoin] Other (See Comments) 01/11/2021    Family History  Problem Relation Age of Onset   Prostate cancer Father     Social History   Socioeconomic History   Marital status: Widowed    Spouse name: Not on file   Number of children: 1   Years of education: Not on file   Highest education level: Not on file  Occupational History   Occupation: reitred  Tobacco Use   Smoking status: Never    Passive exposure: Never   Smokeless tobacco: Never  Vaping Use   Vaping status: Never Used  Substance and Sexual Activity   Alcohol use: No    Alcohol/week: 0.0 standard drinks of alcohol   Drug use: No   Sexual activity: Not on file  Other Topics Concern   Not on file  Social History Narrative   Not on file   Social Drivers of Health   Financial Resource Strain: Low Risk  (12/20/2022)   Overall Financial Resource Strain (CARDIA)    Difficulty of Paying Living Expenses: Not hard at all  Food Insecurity: No Food Insecurity (12/20/2022)   Hunger Vital Sign    Worried About Running Out of Food in the Last Year: Never true    Ran Out of Food in the Last Year: Never true  Transportation Needs: No Transportation Needs (12/20/2022)   PRAPARE - Administrator, Civil Service (Medical): No    Lack of Transportation (Non-Medical): No  Physical Activity: Sufficiently Active (12/20/2022)   Exercise Vital Sign    Days of Exercise per Week: 7 days    Minutes of Exercise per Session: 30 min  Stress: No Stress Concern Present (12/20/2022)   [employer] of Occupational Health - Occupational Stress Questionnaire    Feeling of Stress : Not at all  Social Connections: Socially Integrated (12/20/2022)   Social Connection and Isolation Panel [NHANES]    Frequency of Communication with Friends and Family: More than three times a week    Frequency of Social Gatherings with Friends and Family: More than three times a week    Attends Religious Services: More than 4 times per year    Active Member of [employer] or Organizations: Yes    Attends Engineer, structural: More than 4 times per year    Marital Status: Married  Review of systems General: negative for malaise, night sweats, fever, chills, weight loss Neck: Negative for lumps, goiter, pain and significant neck swelling Resp: Negative for cough, wheezing, dyspnea at rest CV: Negative for chest pain, leg swelling, palpitations, orthopnea GI: denies  hematochezia, nausea, vomiting, diarrhea, constipation, dysphagia, odyonophagia, early satiety or unintentional weight loss. +dark stools  The remainder of the review of systems is noncontributory.  Physical Exam: BP 109/77   Pulse (!) 108   Temp (!) 97.3 F (36.3 C)   Ht 5\' 3"  (1.6 m)   Wt 151 lb 11.2 oz (68.8 kg)   BMI 26.87 kg/m  General:   Alert and oriented. No distress noted. Pleasant and cooperative.  Head:  Normocephalic and atraumatic. Eyes:  Conjuctiva clear without scleral icterus. Mouth:  Oral mucosa pink and moist. Good dentition. No lesions. Heart: Normal rate and rhythm, s1 and s2 heart sounds present.  Lungs: Clear lung sounds in all lobes. Respirations equal and unlabored. Abdomen:  +BS, soft, non-tender and non-distended. No rebound or guarding. No HSM or masses noted. Neurologic:  Alert and  oriented x4 Psych:  Alert and cooperative. Normal mood and affect.  Invalid input(s): "6 MONTHS"   ASSESSMENT: Stephanie Wood is a 89 y.o. female presenting today for dark stools   Patient with acute onset of  darker stools this morning though unclear if these are black as patient thinks it may have been just very dark brown.  She does note similar episode in the past and was told it was likely related to her Flintstone vitamins which she is currently still on.  She does endorse eating more dark greens recently as well.  She has not taken any Pepto-Bismol.  She has some chronic intermittent left lower quadrant pain and looser stools secondary to her history of IBS and notes no change in the symptoms with onset of darker stools.  She does not take any NSAIDs, denies any bright red blood per rectum, no fever no chills no nausea no vomiting.  Given question of black stools, will complete occult stool cards x 3, if stool cards are positive will need to proceed with EGD for further evaluation.  I have asked her to let me know if she develops any new GI symptoms.   PLAN:  -occult stool cards x3 -proceed with EGD if positive  All questions were answered, patient verbalized understanding and is in agreement with plan as outlined above.    Follow Up: 3 months   Aubrianna Orchard L. Jeanmarie Hubert, MSN, APRN, AGNP-C Adult-Gerontology Nurse Practitioner [facility]  I have reviewed the note and agree with the APP's assessment as described in this progress note  Katrinka Blazing, MD Gastroenterology and Hepatology [facility]

## 2023-07-10 NOTE — Telephone Encounter (Signed)
Patient called this am, having Left lower abdominal pain and dark stools, and wants an office visit today. Denies any sight of bright red blood or fever. Says today is the only time she has seen it this dark. She is taking po Fe, and has not taken any Pepto or ate anything dark in color. I offered the patient an appointment for today at 12:15 with [facility]. Patient aware I have placed her on the schedule for this time, she will call me back to let me know whether or not she will be able to make this time.

## 2023-07-10 NOTE — Telephone Encounter (Signed)
Patient returned the call and says she will be here at the give time of 12:15 today with [facility].

## 2023-07-10 NOTE — Patient Instructions (Signed)
As discussed, we will check stool cards x3 to look for presence of blood as I suspect your darker stools may be from your flintstone vitamins or greens you have been eating. If stool cards are positive, we will need to discuss scheduling you for an upper endoscopy for further evaluation.   Follow up 3 months

## 2023-07-11 ENCOUNTER — Other Ambulatory Visit (INDEPENDENT_AMBULATORY_CARE_PROVIDER_SITE_OTHER): Payer: Self-pay | Admitting: *Deleted

## 2023-07-11 ENCOUNTER — Telehealth (INDEPENDENT_AMBULATORY_CARE_PROVIDER_SITE_OTHER): Payer: Self-pay | Admitting: *Deleted

## 2023-07-11 DIAGNOSIS — R195 Other fecal abnormalities: Secondary | ICD-10-CM | POA: Diagnosis not present

## 2023-07-11 LAB — POC HEMOCCULT BLD/STL (HOME/3-CARD/SCREEN)
Card #2 Fecal Occult Blod, POC: POSITIVE
Card #3 Fecal Occult Blood, POC: POSITIVE
Fecal Occult Blood, POC: POSITIVE — AB

## 2023-07-11 NOTE — Telephone Encounter (Signed)
Patient dropped off hemoccult cards. See result note. All three positive.

## 2023-07-12 NOTE — Telephone Encounter (Signed)
Copied from CRM [id number]. Topic: Clinical - Lab/Test Results >> Jul 11, 2023  2:39 PM Gery Pray wrote: Reason for CRM: Daughter calling regarding Urinalysis results. Results showed abnormal and was not able to share them with Verta Ellen [phone].

## 2023-07-12 NOTE — Progress Notes (Signed)
Pt daugther Lynden Ang contacted on home number. Pt scheduled for 08/03/23 at 7:30am. Will mail instructions once pre op is received.

## 2023-07-13 NOTE — Telephone Encounter (Signed)
Patient advised.

## 2023-07-16 NOTE — Progress Notes (Signed)
  History of Present Illness: 89 year old female presents for annual follow-up.  She has a history of recurrent urinary tract infections.  She is maintained on methenamine twice a day.  She   Past Medical History:  Diagnosis Date   Acid reflux disease    Anemia    Anxiety    Colonic adenoma    Depression    Irritable bowel syndrome     Past Surgical History:  Procedure Laterality Date   CATARACT EXTRACTION     COLONOSCOPY N/A 04/15/2014   Procedure: COLONOSCOPY;  Surgeon: Malissa Hippo, MD;  [facility];  Service: Endoscopy;  Laterality: N/A;  730   ESOPHAGOGASTRODUODENOSCOPY  09/20/2011   Procedure: ESOPHAGOGASTRODUODENOSCOPY (EGD);  Surgeon: Malissa Hippo, MD;  [facility];  Service: Endoscopy;  Laterality: N/A;  215   ESOPHAGOGASTRODUODENOSCOPY (EGD) WITH PROPOFOL N/A 03/16/2022   Procedure: ESOPHAGOGASTRODUODENOSCOPY (EGD) WITH PROPOFOL;  Surgeon: Dolores Frame, MD;  [facility];  Service: Gastroenterology;  Laterality: N/A;  1230 ASA 3   EYE SURGERY     bil catatract surgery    Home Medications:  (Not in a hospital admission)   Allergies:  Allergies  Allergen Reactions   Drug Class [Trazodone And Nefazodone]    Macrobid [Nitrofurantoin] Other (See Comments)    Elevated LFT's    Family History  Problem Relation Age of Onset   Prostate cancer Father     Social History:  reports that she has never smoked. She has never been exposed to tobacco smoke. She has never used smokeless tobacco. She reports that she does not drink alcohol and does not use drugs.  ROS: A complete review of systems was performed.  All systems are negative except for pertinent findings as noted.  Physical Exam:  Vital signs in last 24 hours: @VSRANGES @ General:  Alert and oriented, No acute distress HEENT: Normocephalic, atraumatic Neck: No JVD or lymphadenopathy Cardiovascular: Regular rate  Lungs: Normal inspiratory/expiratory  excursion Neurologic: Grossly intact  I have reviewed prior pt notes  I have reviewed urinalysis results  I have reviewed prior urine culture   Impression/Assessment:  1.  History of recurrent urinary tract infections, none recently on suppression with methenamine  2.  Postmenopausal atrophic vaginal changes, not on regular use of estrogen cream  Plan:  Chelsea Aus 07/16/2023, 6:44 PM  Bertram Millard. Jayshawn Colston MD

## 2023-07-17 ENCOUNTER — Ambulatory Visit: Payer: PPO | Admitting: Urology

## 2023-07-17 VITALS — BP 116/70 | HR 128

## 2023-07-17 DIAGNOSIS — N952 Postmenopausal atrophic vaginitis: Secondary | ICD-10-CM

## 2023-07-17 DIAGNOSIS — Z87440 Personal history of urinary (tract) infections: Secondary | ICD-10-CM

## 2023-07-17 DIAGNOSIS — N390 Urinary tract infection, site not specified: Secondary | ICD-10-CM

## 2023-07-23 ENCOUNTER — Inpatient Hospital Stay (HOSPITAL_COMMUNITY)
Admission: EM | Admit: 2023-07-23 | Discharge: 2023-08-01 | DRG: 835 | Disposition: A | Discharge status: Skilled Nursing Facility | Payer: PPO | Attending: Internal Medicine | Admitting: Internal Medicine

## 2023-07-23 ENCOUNTER — Encounter (HOSPITAL_COMMUNITY): Payer: Self-pay | Admitting: *Deleted

## 2023-07-23 ENCOUNTER — Emergency Department (HOSPITAL_COMMUNITY): Payer: PPO

## 2023-07-23 ENCOUNTER — Other Ambulatory Visit: Payer: Self-pay

## 2023-07-23 DIAGNOSIS — E871 Hypo-osmolality and hyponatremia: Secondary | ICD-10-CM | POA: Diagnosis not present

## 2023-07-23 DIAGNOSIS — E876 Hypokalemia: Secondary | ICD-10-CM | POA: Diagnosis not present

## 2023-07-23 DIAGNOSIS — K580 Irritable bowel syndrome with diarrhea: Secondary | ICD-10-CM | POA: Diagnosis present

## 2023-07-23 DIAGNOSIS — D62 Acute posthemorrhagic anemia: Secondary | ICD-10-CM | POA: Diagnosis not present

## 2023-07-23 DIAGNOSIS — M810 Age-related osteoporosis without current pathological fracture: Secondary | ICD-10-CM | POA: Diagnosis present

## 2023-07-23 DIAGNOSIS — N390 Urinary tract infection, site not specified: Secondary | ICD-10-CM | POA: Diagnosis present

## 2023-07-23 DIAGNOSIS — E785 Hyperlipidemia, unspecified: Secondary | ICD-10-CM | POA: Diagnosis present

## 2023-07-23 DIAGNOSIS — R531 Weakness: Secondary | ICD-10-CM | POA: Diagnosis not present

## 2023-07-23 DIAGNOSIS — C9200 Acute myeloblastic leukemia, not having achieved remission: Principal | ICD-10-CM

## 2023-07-23 DIAGNOSIS — M549 Dorsalgia, unspecified: Secondary | ICD-10-CM | POA: Diagnosis not present

## 2023-07-23 DIAGNOSIS — R55 Syncope and collapse: Secondary | ICD-10-CM

## 2023-07-23 DIAGNOSIS — R109 Unspecified abdominal pain: Secondary | ICD-10-CM | POA: Diagnosis not present

## 2023-07-23 DIAGNOSIS — R1032 Left lower quadrant pain: Secondary | ICD-10-CM | POA: Diagnosis present

## 2023-07-23 DIAGNOSIS — Y92009 Unspecified place in unspecified non-institutional (private) residence as the place of occurrence of the external cause: Secondary | ICD-10-CM | POA: Diagnosis not present

## 2023-07-23 DIAGNOSIS — S36119D Unspecified injury of liver, subsequent encounter: Secondary | ICD-10-CM | POA: Diagnosis not present

## 2023-07-23 DIAGNOSIS — I9589 Other hypotension: Secondary | ICD-10-CM | POA: Diagnosis not present

## 2023-07-23 DIAGNOSIS — K644 Residual hemorrhoidal skin tags: Secondary | ICD-10-CM | POA: Diagnosis not present

## 2023-07-23 DIAGNOSIS — R000 Tachycardia, unspecified: Secondary | ICD-10-CM | POA: Diagnosis not present

## 2023-07-23 DIAGNOSIS — D61818 Other pancytopenia: Principal | ICD-10-CM | POA: Diagnosis present

## 2023-07-23 DIAGNOSIS — M6281 Muscle weakness (generalized): Secondary | ICD-10-CM | POA: Diagnosis not present

## 2023-07-23 DIAGNOSIS — A419 Sepsis, unspecified organism: Secondary | ICD-10-CM | POA: Diagnosis present

## 2023-07-23 DIAGNOSIS — R509 Fever, unspecified: Secondary | ICD-10-CM | POA: Diagnosis not present

## 2023-07-23 DIAGNOSIS — Z23 Encounter for immunization: Secondary | ICD-10-CM

## 2023-07-23 DIAGNOSIS — D649 Anemia, unspecified: Principal | ICD-10-CM

## 2023-07-23 DIAGNOSIS — K2101 Gastro-esophageal reflux disease with esophagitis, with bleeding: Secondary | ICD-10-CM

## 2023-07-23 DIAGNOSIS — Z66 Do not resuscitate: Secondary | ICD-10-CM | POA: Diagnosis present

## 2023-07-23 DIAGNOSIS — K921 Melena: Secondary | ICD-10-CM | POA: Diagnosis present

## 2023-07-23 DIAGNOSIS — R059 Cough, unspecified: Secondary | ICD-10-CM | POA: Diagnosis not present

## 2023-07-23 DIAGNOSIS — Z888 Allergy status to other drugs, medicaments and biological substances status: Secondary | ICD-10-CM

## 2023-07-23 DIAGNOSIS — R739 Hyperglycemia, unspecified: Secondary | ICD-10-CM | POA: Diagnosis not present

## 2023-07-23 DIAGNOSIS — R1319 Other dysphagia: Secondary | ICD-10-CM | POA: Diagnosis not present

## 2023-07-23 DIAGNOSIS — R42 Dizziness and giddiness: Secondary | ICD-10-CM | POA: Diagnosis not present

## 2023-07-23 DIAGNOSIS — Z860101 Personal history of adenomatous and serrated colon polyps: Secondary | ICD-10-CM | POA: Diagnosis not present

## 2023-07-23 DIAGNOSIS — W19XXXA Unspecified fall, initial encounter: Secondary | ICD-10-CM | POA: Diagnosis not present

## 2023-07-23 DIAGNOSIS — K449 Diaphragmatic hernia without obstruction or gangrene: Secondary | ICD-10-CM | POA: Diagnosis not present

## 2023-07-23 DIAGNOSIS — R498 Other voice and resonance disorders: Secondary | ICD-10-CM | POA: Diagnosis not present

## 2023-07-23 DIAGNOSIS — K922 Gastrointestinal hemorrhage, unspecified: Secondary | ICD-10-CM | POA: Diagnosis present

## 2023-07-23 DIAGNOSIS — K219 Gastro-esophageal reflux disease without esophagitis: Secondary | ICD-10-CM | POA: Diagnosis present

## 2023-07-23 DIAGNOSIS — Z79899 Other long term (current) drug therapy: Secondary | ICD-10-CM

## 2023-07-23 DIAGNOSIS — J9811 Atelectasis: Secondary | ICD-10-CM | POA: Diagnosis not present

## 2023-07-23 DIAGNOSIS — F411 Generalized anxiety disorder: Secondary | ICD-10-CM | POA: Diagnosis present

## 2023-07-23 DIAGNOSIS — Z1152 Encounter for screening for COVID-19: Secondary | ICD-10-CM | POA: Diagnosis not present

## 2023-07-23 DIAGNOSIS — I6782 Cerebral ischemia: Secondary | ICD-10-CM | POA: Diagnosis not present

## 2023-07-23 DIAGNOSIS — R159 Full incontinence of feces: Secondary | ICD-10-CM | POA: Diagnosis present

## 2023-07-23 DIAGNOSIS — Z800 Family history of malignant neoplasm of digestive organs: Secondary | ICD-10-CM | POA: Diagnosis not present

## 2023-07-23 DIAGNOSIS — R195 Other fecal abnormalities: Secondary | ICD-10-CM | POA: Diagnosis not present

## 2023-07-23 DIAGNOSIS — R0989 Other specified symptoms and signs involving the circulatory and respiratory systems: Secondary | ICD-10-CM | POA: Diagnosis not present

## 2023-07-23 DIAGNOSIS — Z87440 Personal history of urinary (tract) infections: Secondary | ICD-10-CM

## 2023-07-23 DIAGNOSIS — R488 Other symbolic dysfunctions: Secondary | ICD-10-CM | POA: Diagnosis not present

## 2023-07-23 DIAGNOSIS — C9290 Myeloid leukemia, unspecified, not having achieved remission: Secondary | ICD-10-CM | POA: Diagnosis not present

## 2023-07-23 DIAGNOSIS — F32A Depression, unspecified: Secondary | ICD-10-CM | POA: Diagnosis present

## 2023-07-23 DIAGNOSIS — R262 Difficulty in walking, not elsewhere classified: Secondary | ICD-10-CM | POA: Diagnosis not present

## 2023-07-23 DIAGNOSIS — R6521 Severe sepsis with septic shock: Secondary | ICD-10-CM | POA: Diagnosis not present

## 2023-07-23 LAB — RESP PANEL BY RT-PCR (RSV, FLU A&B, COVID)  RVPGX2
Influenza A by PCR: NEGATIVE
Influenza B by PCR: NEGATIVE
Resp Syncytial Virus by PCR: NEGATIVE
SARS Coronavirus 2 by RT PCR: NEGATIVE

## 2023-07-23 LAB — CBC WITH DIFFERENTIAL/PLATELET
Abs Immature Granulocytes: 0 10*3/uL (ref 0.00–0.07)
Band Neutrophils: 1 %
Basophils Absolute: 0 10*3/uL (ref 0.0–0.1)
Basophils Relative: 1 %
Eosinophils Absolute: 0 10*3/uL (ref 0.0–0.5)
Eosinophils Relative: 0 %
HCT: 29.4 % — ABNORMAL LOW (ref 36.0–46.0)
Hemoglobin: 9.4 g/dL — ABNORMAL LOW (ref 12.0–15.0)
Lymphocytes Relative: 33 %
Lymphs Abs: 0.4 10*3/uL — ABNORMAL LOW (ref 0.7–4.0)
MCH: 29.3 pg (ref 26.0–34.0)
MCHC: 32 g/dL (ref 30.0–36.0)
MCV: 91.6 fL (ref 80.0–100.0)
Monocytes Absolute: 0.1 10*3/uL (ref 0.1–1.0)
Monocytes Relative: 4 %
Neutro Abs: 0.8 10*3/uL — ABNORMAL LOW (ref 1.7–7.7)
Neutrophils Relative %: 61 %
Platelets: 50 10*3/uL — ABNORMAL LOW (ref 150–400)
RBC: 3.21 MIL/uL — ABNORMAL LOW (ref 3.87–5.11)
RDW: 18.2 % — ABNORMAL HIGH (ref 11.5–15.5)
WBC: 1.3 10*3/uL — CL (ref 4.0–10.5)
nRBC: 0 % (ref 0.0–0.2)

## 2023-07-23 LAB — COMPREHENSIVE METABOLIC PANEL
ALT: 19 U/L (ref 0–44)
AST: 25 U/L (ref 15–41)
Albumin: 3.2 g/dL — ABNORMAL LOW (ref 3.5–5.0)
Alkaline Phosphatase: 95 U/L (ref 38–126)
Anion gap: 12 (ref 5–15)
BUN: 12 mg/dL (ref 8–23)
CO2: 26 mmol/L (ref 22–32)
Calcium: 9 mg/dL (ref 8.9–10.3)
Chloride: 96 mmol/L — ABNORMAL LOW (ref 98–111)
Creatinine, Ser: 0.93 mg/dL (ref 0.44–1.00)
GFR, Estimated: 60 mL/min — ABNORMAL LOW (ref >60)
Glucose, Bld: 129 mg/dL — ABNORMAL HIGH (ref 70–99)
Potassium: 3 mmol/L — ABNORMAL LOW (ref 3.5–5.1)
Sodium: 134 mmol/L — ABNORMAL LOW (ref 135–145)
Total Bilirubin: 1.1 mg/dL (ref 0.0–1.2)
Total Protein: 7.5 g/dL (ref 6.5–8.1)

## 2023-07-23 LAB — URINALYSIS, ROUTINE W REFLEX MICROSCOPIC
Bacteria, UA: NONE SEEN
Bilirubin Urine: NEGATIVE
Glucose, UA: NEGATIVE mg/dL
Ketones, ur: NEGATIVE mg/dL
Leukocytes,Ua: NEGATIVE
Nitrite: NEGATIVE
Protein, ur: NEGATIVE mg/dL
Specific Gravity, Urine: 1.009 (ref 1.005–1.030)
pH: 5 (ref 5.0–8.0)

## 2023-07-23 LAB — MAGNESIUM: Magnesium: 1.4 mg/dL — ABNORMAL LOW (ref 1.7–2.4)

## 2023-07-23 LAB — LIPASE, BLOOD: Lipase: 31 U/L (ref 11–51)

## 2023-07-23 LAB — PHOSPHORUS: Phosphorus: 1.5 mg/dL — ABNORMAL LOW (ref 2.5–4.6)

## 2023-07-23 MED ORDER — PANTOPRAZOLE SODIUM 40 MG IV SOLR
40.0000 mg | Freq: Once | INTRAVENOUS | Status: AC
  Administered 2023-07-23: 40 mg via INTRAVENOUS
  Filled 2023-07-23: qty 10

## 2023-07-23 MED ORDER — ACETAMINOPHEN 325 MG PO TABS
650.0000 mg | ORAL_TABLET | Freq: Four times a day (QID) | ORAL | Status: DC | PRN
  Administered 2023-07-23 – 2023-08-01 (×11): 650 mg via ORAL
  Filled 2023-07-23 (×11): qty 2

## 2023-07-23 MED ORDER — IOHEXOL 300 MG/ML  SOLN
100.0000 mL | Freq: Once | INTRAMUSCULAR | Status: AC | PRN
  Administered 2023-07-23: 100 mL via INTRAVENOUS

## 2023-07-23 MED ORDER — ONDANSETRON HCL 4 MG PO TABS: 4.0000 mg | ORAL_TABLET | Freq: Four times a day (QID) | ORAL | Status: DC | PRN

## 2023-07-23 MED ORDER — PANTOPRAZOLE SODIUM 40 MG IV SOLR
80.0000 mg | INTRAVENOUS | Status: DC
  Administered 2023-07-23: 80 mg via INTRAVENOUS
  Filled 2023-07-23: qty 20

## 2023-07-23 MED ORDER — PANTOPRAZOLE SODIUM 40 MG PO TBEC: 40.0000 mg | DELAYED_RELEASE_TABLET | Freq: Every day | ORAL | Status: DC

## 2023-07-23 MED ORDER — DICYCLOMINE HCL 10 MG PO CAPS: 10.0000 mg | ORAL_CAPSULE | Freq: Two times a day (BID) | ORAL | Status: DC | PRN

## 2023-07-23 MED ORDER — POTASSIUM CHLORIDE CRYS ER 20 MEQ PO TBCR
40.0000 meq | EXTENDED_RELEASE_TABLET | Freq: Two times a day (BID) | ORAL | Status: DC
  Administered 2023-07-23 – 2023-07-24 (×3): 40 meq via ORAL
  Filled 2023-07-23 (×3): qty 2

## 2023-07-23 MED ORDER — ACETAMINOPHEN 650 MG RE SUPP: 650.0000 mg | Freq: Four times a day (QID) | RECTAL | Status: DC | PRN

## 2023-07-23 MED ORDER — LACTATED RINGERS IV BOLUS
1000.0000 mL | Freq: Once | INTRAVENOUS | Status: AC
  Administered 2023-07-23: 1000 mL via INTRAVENOUS

## 2023-07-23 MED ORDER — ATORVASTATIN CALCIUM 40 MG PO TABS
20.0000 mg | ORAL_TABLET | Freq: Every day | ORAL | Status: DC
Start: 2023-07-24 — End: 2023-08-01
  Administered 2023-07-24 – 2023-08-01 (×9): 20 mg via ORAL
  Filled 2023-07-23 (×9): qty 1

## 2023-07-23 MED ORDER — ONDANSETRON HCL 4 MG/2ML IJ SOLN: 4.0000 mg | Freq: Four times a day (QID) | INTRAMUSCULAR | Status: DC | PRN

## 2023-07-23 MED ORDER — LOPERAMIDE HCL 2 MG PO CAPS
2.0000 mg | ORAL_CAPSULE | ORAL | Status: DC | PRN
  Administered 2023-07-24 – 2023-07-30 (×5): 2 mg via ORAL
  Filled 2023-07-23 (×5): qty 1

## 2023-07-23 MED ORDER — FAMOTIDINE IN NACL 20-0.9 MG/50ML-% IV SOLN
20.0000 mg | INTRAVENOUS | Status: DC
  Administered 2023-07-23 – 2023-07-24 (×2): 20 mg via INTRAVENOUS
  Filled 2023-07-23 (×2): qty 50

## 2023-07-23 MED ORDER — ALPRAZOLAM 0.25 MG PO TABS
0.5000 mg | ORAL_TABLET | Freq: Every day | ORAL | Status: DC
  Administered 2023-07-23 – 2023-07-31 (×9): 0.5 mg via ORAL
  Filled 2023-07-23 (×3): qty 1
  Filled 2023-07-23: qty 2
  Filled 2023-07-23: qty 1
  Filled 2023-07-23: qty 2
  Filled 2023-07-23 (×2): qty 1
  Filled 2023-07-23: qty 2

## 2023-07-23 MED ORDER — MECLIZINE HCL 12.5 MG PO TABS
12.5000 mg | ORAL_TABLET | Freq: Once | ORAL | Status: AC
  Administered 2023-07-23: 12.5 mg via ORAL
  Filled 2023-07-23: qty 1

## 2023-07-23 MED ORDER — ACETAMINOPHEN 325 MG PO TABS
650.0000 mg | ORAL_TABLET | Freq: Once | ORAL | Status: AC
  Administered 2023-07-23: 650 mg via ORAL
  Filled 2023-07-23: qty 2

## 2023-07-23 MED ORDER — SUCRALFATE 1 GM/10ML PO SUSP
1.0000 g | Freq: Two times a day (BID) | ORAL | Status: DC
Start: 2023-07-23 — End: 2023-07-24
  Administered 2023-07-23 – 2023-07-24 (×2): 1 g via ORAL
  Filled 2023-07-23 (×2): qty 10

## 2023-07-23 MED ORDER — METHOCARBAMOL 1000 MG/10ML IJ SOLN
500.0000 mg | Freq: Four times a day (QID) | INTRAMUSCULAR | Status: DC | PRN
  Administered 2023-07-24 – 2023-07-26 (×4): 500 mg via INTRAVENOUS
  Filled 2023-07-23 (×4): qty 10

## 2023-07-23 MED ORDER — SODIUM CHLORIDE 0.9 % IV SOLN: INTRAVENOUS | Status: DC

## 2023-07-23 NOTE — Plan of Care (Signed)
  Problem: Education: Goal: Knowledge of General Education information will improve Description: Including pain rating scale, medication(s)/side effects and non-pharmacologic comfort measures Outcome: Progressing   Problem: Health Behavior/Discharge Planning: Goal: Ability to manage health-related needs will improve Outcome: Progressing   Problem: Nutrition: Goal: Adequate nutrition will be maintained Outcome: Not Progressing   Problem: Coping: Goal: Level of anxiety will decrease Outcome: Not Progressing

## 2023-07-23 NOTE — Consult Note (Signed)
 Gastroenterology Consult   Referring Provider: [facility] Primary Care Physician:  Billie Lade, MD Primary Gastroenterologist:  Dr. Levon Hedger  Patient ID: Stephanie Wood; [id number]; 12/30/1934   Admit date: 07/23/2023  LOS: 0 days   Date of Consultation: 07/23/2023  Reason for Consultation:  heme positive stools as outpatient, new anemia  History of Present Illness   Stephanie Wood is an 89 y.o. 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 She is presenting today now with feeling dizzy, unsteady, and found to have Hgb 9.4, down from 12.3 in September. Heme positive stool as outpatient recently. GI consult now requested    In the ED: new onset pancytopenia with WBC count 1.3, RBC 3.21, Hgb 9.4, Hct 29.4, platelets 50. Hgb in Sept 2024 was 12.3. Platelets in Sept 2024 were 117.   CT abd/pelvis with contrast: trace new pleural effusion, atelectasis, chronic moderate to large gastric hiatal hernia, spleen normal.   CT head without contrast: no acute findings. Mild chronic small vessel ischemic changes. Generalized parenchymal atrophy.   CXR: low lung volumes, vascular congestion, mildly increased atelectasis. No pneumonia or pulmonary edema.   Today:  Feels weak and tired. Fuzzy-headed past few days. Black stool a few weeks ago. Went to [facility] for evaluation. Heme positive as outpatient. No NSAIDs. Last episode of black stool was 2/11. Stopped flinstone MVI once seeing black stool. Flinstone vitamin with iron when had black stool, stopped taking it. No pepto.   Chronic looser stool. Will take imodium if more than 3 loose stools per day. Chronic LLQ abdominal pain intermittently. Currently no pain.     Last EGD: 02/2022 6 centimeter hiatal hernia, normal stomach and duodenum   Last Colonoscopy: 2015 Examination performed to cecum. No evidence of recurrent polyps. Single small diverticulum at sigmoid colon. Small external hemorrhoids and anal papillae.  Remote history of tubular adenoma in 2005.  FH colon cancer in brother.    Past Medical History:  Diagnosis Date   Acid reflux disease    Anemia    Anxiety    Colonic adenoma    Depression    Irritable bowel syndrome     Past Surgical History:  Procedure Laterality Date   CATARACT EXTRACTION     COLONOSCOPY N/A 04/15/2014   Procedure: COLONOSCOPY;  Surgeon: Malissa Hippo, MD;  [facility];  Service: Endoscopy;  Laterality: N/A;  730   ESOPHAGOGASTRODUODENOSCOPY  09/20/2011   Procedure: ESOPHAGOGASTRODUODENOSCOPY (EGD);  Surgeon: Malissa Hippo, MD;  [facility];  Service: Endoscopy;  Laterality: N/A;  215   ESOPHAGOGASTRODUODENOSCOPY (EGD) WITH PROPOFOL N/A 03/16/2022   Procedure: ESOPHAGOGASTRODUODENOSCOPY (EGD) WITH PROPOFOL;  Surgeon: Dolores Frame, MD;  [facility];  Service: Gastroenterology;  Laterality: N/A;  1230 ASA 3   EYE SURGERY     bil catatract surgery    Prior to Admission medications   Medication Sig Start Date End Date Taking? Authorizing Provider  ALPRAZolam Prudy Feeler) 0.5 MG tablet Take 0.5 mg by mouth at bedtime.    [provider]  atorvastatin (LIPITOR) 20 MG tablet Take 20 mg by mouth daily.    [provider]  bifidobacterium infantis (ALIGN) capsule Take 1 capsule by  mouth daily.    [provider]  Calcium Carbonate (CALCIUM 600 PO) Take 600 mg by mouth daily.    [provider]  dicyclomine (BENTYL) 10 MG capsule Take 1 capsule (10 mg total) by mouth every 12 (twelve) hours as needed for spasms (abdominal pain). 09/25/22   Dolores Frame, MD  ELDERBERRY PO Take 1 capsule by mouth daily.    [provider]  furosemide (LASIX) 20 MG tablet Take 1 tablet (20 mg total) by mouth daily. 05/28/23   Mallipeddi, Vishnu P, MD  loperamide (IMODIUM) 2 MG capsule Take 2 mg by mouth as needed for diarrhea or loose stools. 10/23/17   Rehman, Joline Maxcy, MD  loratadine (CLARITIN) 10 MG tablet Take 1 tablet (10 mg total) by mouth daily. 02/12/23   Billie Lade, MD  methenamine (HIPREX) 1 g tablet TAKE 1 TABLET BY MOUTH TWICE DAILY. 05/04/23   Marcine Matar, MD  omeprazole (PRILOSEC) 40 MG capsule TAKE 1 CAPSULE BY MOUTH DAILY 06/11/23   Marguerita Merles, Reuel Boom, MD  Pediatric Multivitamins-Iron (FLINTSTONES PLUS IRON) chewable tablet Chew 1 tablet by mouth 2 (two) times a week. 07/22/18   Rehman, Joline Maxcy, MD  PROLIA 60 MG/ML SOSY injection Inject 60 mg into the skin every 6 (six) months. 05/01/23   Billie Lade, MD  vitamin B-12 (CYANOCOBALAMIN) 1000 MCG tablet Take 1,000 mcg by mouth daily.    [provider]    No current facility-administered medications for this encounter.   Current Outpatient Medications  Medication Sig Dispense Refill   ALPRAZolam (XANAX) 0.5 MG tablet Take 0.5 mg by mouth at bedtime.     atorvastatin (LIPITOR) 20 MG tablet Take 20 mg by mouth daily.     bifidobacterium infantis (ALIGN) capsule Take 1 capsule by mouth daily.     Calcium Carbonate (CALCIUM 600 PO) Take 600 mg by mouth daily.     dicyclomine (BENTYL) 10 MG capsule Take 1 capsule (10 mg total) by mouth every 12 (twelve) hours as needed for spasms (abdominal pain). 90 capsule 1   ELDERBERRY PO Take 1 capsule by mouth daily.     furosemide (LASIX) 20 MG tablet Take 1 tablet (20 mg total) by mouth daily. 90 tablet 1   loperamide (IMODIUM) 2 MG capsule Take 2 mg by mouth as needed for diarrhea or loose stools. 30 capsule    loratadine (CLARITIN) 10 MG tablet Take 1 tablet (10 mg total) by mouth daily. 90 tablet 3   methenamine (HIPREX) 1 g tablet TAKE 1 TABLET BY MOUTH TWICE DAILY. 90 tablet 8    omeprazole (PRILOSEC) 40 MG capsule TAKE 1 CAPSULE BY MOUTH DAILY 90 capsule 3   Pediatric Multivitamins-Iron (FLINTSTONES PLUS IRON) chewable tablet Chew 1 tablet by mouth 2 (two) times a week.     PROLIA 60 MG/ML SOSY injection Inject 60 mg into the skin every 6 (six) months. 1 mL 1   vitamin B-12 (CYANOCOBALAMIN) 1000 MCG tablet Take 1,000 mcg by mouth daily.      Allergies as of 07/23/2023 - Review Complete 07/23/2023  Allergen Reaction Noted   Drug class [trazodone and nefazodone]  06/29/2020   Macrobid [nitrofurantoin] Other (See Comments) 01/11/2021    Family History  Problem Relation Age of Onset   Prostate cancer Father     Social History   Socioeconomic History   Marital status: Widowed    Spouse name: Not on file   Number of children: 1   Years of education:  Not on file   Highest education level: Not on file  Occupational History   Occupation: reitred  Tobacco Use   Smoking status: Never    Passive exposure: Never   Smokeless tobacco: Never  Vaping Use   Vaping status: Never Used  Substance and Sexual Activity   Alcohol use: No    Alcohol/week: 0.0 standard drinks of alcohol   Drug use: No   Sexual activity: Not on file  Other Topics Concern   Not on file  Social History Narrative   Not on file   Social Drivers of Health   Financial Resource Strain: Low Risk  (12/20/2022)   Overall Financial Resource Strain (CARDIA)    Difficulty of Paying Living Expenses: Not hard at all  Food Insecurity: No Food Insecurity (12/20/2022)   Hunger Vital Sign    Worried About Running Out of Food in the Last Year: Never true    Ran Out of Food in the Last Year: Never true  Transportation Needs: No Transportation Needs (12/20/2022)   PRAPARE - Administrator, Civil Service (Medical): No    Wood of Transportation (Non-Medical): No  Physical Activity: Sufficiently Active (12/20/2022)   Exercise Vital Sign    Days of Exercise per Week: 7 days    Minutes of  Exercise per Session: 30 min  Stress: No Stress Concern Present (12/20/2022)   [employer] of Occupational Health - Occupational Stress Questionnaire    Feeling of Stress : Not at all  Social Connections: Socially Integrated (12/20/2022)   Social Connection and Isolation Panel [NHANES]    Frequency of Communication with Friends and Family: More than three times a week    Frequency of Social Gatherings with Friends and Family: More than three times a week    Attends Religious Services: More than 4 times per year    Active Member of [employer] or Organizations: Yes    Attends Engineer, structural: More than 4 times per year    Marital Status: Married  Catering manager Violence: Not At Risk (12/20/2022)   Humiliation, Afraid, Rape, and Kick questionnaire    Fear of Current or Ex-Partner: No    Emotionally Abused: No    Physically Abused: No    Sexually Abused: No     Review of Systems   Gen: Denies any fever, chills, loss of appetite, change in weight or weight loss CV: Denies chest pain, heart palpitations, syncope, edema  Resp: Denies shortness of breath with rest, cough, wheezing, coughing up blood, and pleurisy. GI: Denies vomiting blood, jaundice, and fecal incontinence.   Denies dysphagia or odynophagia. GU : Denies urinary burning, blood in urine, urinary frequency, and urinary incontinence. MS: Denies joint pain, limitation of movement, swelling, cramps, and atrophy.  Derm: Denies rash, itching, dry skin, hives. Psych: Denies depression, anxiety, memory loss, hallucinations, and confusion. Heme: Denies bruising or bleeding Neuro:  Denies any headaches, dizziness, paresthesias, shaking  Physical Exam   Vital Signs in last 24 hours: Temp:  [99.8 F (37.7 C)-101.2 F (38.4 C)] 99.8 F (37.7 C) (02/24 1200) Pulse Rate:  [80-100] 80 (02/24 1400) Resp:  [15-25] 22 (02/24 1400) BP: (106-128)/(44-62) 106/44 (02/24 1400) SpO2:  [83 %-99 %] 93 % (02/24 1400) Weight:   [68.8 kg] 68.8 kg (02/24 0810)    General:   Alert,  frail, pleasant Head:  Normocephalic and atraumatic. Eyes:  Sclera clear, no icterus.   Conjunctiva pink. Lungs:  Clear throughout to auscultation.    Heart:  S1 S2 present  Abdomen:  Soft, nontender and nondistended. No masses, hepatosplenomegaly or hernias noted. Normal bowel sounds, without guarding, and without rebound.   Rectal: deferred   Msk:  Symmetrical without gross deformities. Normal posture. Neurologic:  Alert and  oriented x4. Skin:  Intact without significant lesions or rashes. Psych:  Alert and cooperative. Normal mood and affect.  Intake/Output from previous day: No intake/output data recorded. Intake/Output this shift: Total I/O In: 1000 [IV Piggyback:1000] Out: -     Labs/Studies   Recent Labs Recent Labs    07/23/23 0842  WBC 1.3*  HGB 9.4*  HCT 29.4*  PLT 50*   BMET Recent Labs    07/23/23 0842  NA 134*  K 3.0*  CL 96*  CO2 26  GLUCOSE 129*  BUN 12  CREATININE 0.93  CALCIUM 9.0   LFT Recent Labs    07/23/23 0842  PROT 7.5  ALBUMIN 3.2*  AST 25  ALT 19  ALKPHOS 95  BILITOT 1.1      Radiology/Studies DG Chest Portable 1 View Result Date: 07/23/2023 CLINICAL DATA:  Cough.  Dizziness with fall. EXAM: PORTABLE CHEST 1 VIEW COMPARISON:  Radiographs 08/23/2013 and 07/28/2013. Abdominal CT 07/23/2023. FINDINGS: 1153 hours. Low lung volumes. The heart size and mediastinal contours are stable with a moderate size hiatal hernia. There is vascular congestion with mildly increased atelectasis at both lung bases. No confluent airspace disease, pleural effusion or pneumothorax. No acute fractures are demonstrated. IMPRESSION: Low lung volumes with vascular congestion and mildly increased atelectasis at both lung bases. No evidence of pneumonia or pulmonary edema. Electronically Signed   By: Carey Bullocks M.D.   On: 07/23/2023 13:44   CT ABDOMEN PELVIS W CONTRAST Result Date:  07/23/2023 CLINICAL DATA:  89 year old female status post fall. Dizziness. Abdominal pain. EXAM: CT ABDOMEN AND PELVIS WITH CONTRAST TECHNIQUE: Multidetector CT imaging of the abdomen and pelvis was performed using the standard protocol following bolus administration of intravenous contrast. RADIATION DOSE REDUCTION: This exam was performed according to the departmental dose-optimization program which includes automated exposure control, adjustment of the mA and/or kV according to patient size and/or use of iterative reconstruction technique. CONTRAST:  OMNIPAQUE IOHEXOL 300 MG/ML  SOLN COMPARISON:  CT Abdomen and Pelvis 01/10/2022. FINDINGS: Lower chest: Moderate to large gastric hiatal hernia does not appear significantly changed since 2023. Superimposed mild cardiomegaly is stable. No pericardial effusion. Chronic increased AP dimension to the lungs. Trace new pleural effusions. Patchy, streaky new dependent lung opacity. Hepatobiliary: Negative liver and gallbladder. Pancreas: Partially atrophied. Spleen: Negative. Adrenals/Urinary Tract: Stable since 2023 and negative. Symmetric renal enhancement and early contrast excretion. Benign left renal cysts (no follow-up imaging recommended). Stomach/Bowel: Fluid in the right colon and transverse colon, but decompressed distal transverse through rectum colonic segments. No large bowel wall thickening identified. Appendix remains normal on coronal image 68. Decompressed terminal ileum and no dilated small bowel. Chronic partially intrathoracic stomach. Second portion duodenum chronic diverticulum with no active inflammation. No free air, free fluid, or mesenteric inflammation identified. Vascular/Lymphatic: Mild for age Aortoiliac calcified atherosclerosis. Normal caliber abdominal aorta. Major arterial structures and portal venous system appear to be patent. No lymphadenopathy. Reproductive: Negative. Other: No pelvis free fluid. Small fat containing right  inguinal hernia is chronic and stable. Musculoskeletal: No displaced lower rib fracture is identified. Chronic L1 compression fracture is moderate to severe but stable since 2023. Stable visible vertebral height otherwise. Chronic osteopenia. No acute osseous abnormality identified. IMPRESSION: 1. Trace new  pleural effusions and patchy dependent lung opacity which more resembles atelectasis than infection. Superimposed chronic moderate to large gastric hiatal hernia. 2. No acute traumatic injury or acute/inflammatory process identified in the abdomen or pelvis. Normal appendix. Chronic L1 compression fracture. 3.  Aortic Atherosclerosis (ICD10-I70.0). Electronically Signed   By: Odessa Fleming M.D.   On: 07/23/2023 10:30   CT Head Wo Contrast Result Date: 07/23/2023 CLINICAL DATA:  Provided history: Syncope/presyncope, cerebrovascular cause suspected. EXAM: CT HEAD WITHOUT CONTRAST TECHNIQUE: Contiguous axial images were obtained from the base of the skull through the vertex without intravenous contrast. RADIATION DOSE REDUCTION: This exam was performed according to the departmental dose-optimization program which includes automated exposure control, adjustment of the mA and/or kV according to patient size and/or use of iterative reconstruction technique. COMPARISON:  None. FINDINGS: Brain: Generalized cerebral and cerebellar atrophy. Patchy and ill-defined hypoattenuation within the cerebral white matter, nonspecific but compatible with mild chronic small vessel ischemic disease. There is no acute intracranial hemorrhage. No demarcated cortical infarct. No extra-axial fluid collection. No evidence of an intracranial mass. No midline shift. Vascular: No hyperdense vessel. Atherosclerotic calcifications. Skull: No calvarial fracture or aggressive osseous lesion. Sinuses/Orbits: No mass or acute finding within the imaged orbits. Minimal mucosal thickening within the bilateral ethmoid and right sphenoid sinuses.  IMPRESSION: 1. No evidence of an acute intracranial abnormality. 2. Mild chronic small vessel ischemic changes within the cerebral white matter. 3. Generalized parenchymal atrophy. Electronically Signed   By: Jackey Loge D.O.   On: 07/23/2023 10:13     Assessment   Stephanie Wood is a delightful 52 y.o. 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 She is presenting today now with feeling dizzy, unsteady, and found to have Hgb 9.4, down from 12.3 in September. Heme positive stool as outpatient recently. GI consult now requested.   New onset anemia: Hgb 9.4, down from 12.3 in Sept 2024 but without overt GI bleeding. She has new onset pancytopenia with WBC count 1.3, RBC 3.21, Platelets now 50 and previously had been 117 in Sept 2024. Reported black stools several weeks ago but none since 2/11 and unclear if this was true melena. Heme positive as outpatient. Again, not overtly bleeding currently. EGD had been arranged for March 2025. Notably, she also has a FH history of colon cancer in brother. Could have occult blood loss from [facility] lesions as does have known large hiatal hernia, AVMs, etc; unable to exclude right-sided colonic lesion as this could present with melena as well.   In light of her presentation with pancytopenia, need to rule out sepsis or other factors (?hematologic). As she was heme positive and concern for melena previously, can consider EGD/Colonoscopy when optimized medically. She is willing to do colonoscopy as well to avoid staged procedures. May benefit from Hematology due to new onset pancytopenia.  Presumed IBS-D: seems to be at baseline for patient. I note she has pancreatic atrophy on  CT. Would not be surprised if underlying EPI as well. Consider pancreatic enzymes once stable from GI standpoint.    Plan / Recommendations    Repeat CBC in am Clear liquids PPI BID Colonoscopy/EGD when clinically appropriate. Consider Hematology consultation due to new onset pancytopenia Will reassess tomorrow  07/23/2023, 3:15 PM  Gelene Mink, PhD, ANP-BC [facility]

## 2023-07-23 NOTE — ED Notes (Signed)
 Date and time results received: 07/23/23 0924   Test: WBC Critical Value: 1.3  Name of Provider Notified: Rhae Hammock, MD

## 2023-07-23 NOTE — ED Triage Notes (Signed)
 Pt BIB [facility] from home for a fall; pt states she felt dizzy when she fell  Cbg 177

## 2023-07-23 NOTE — Plan of Care (Signed)
  Problem: Education: Goal: Knowledge of General Education information will improve Description: Including pain rating scale, medication(s)/side effects and non-pharmacologic comfort measures Outcome: Progressing   Problem: Clinical Measurements: Goal: Ability to maintain clinical measurements within normal limits will improve Outcome: Progressing   Problem: Nutrition: Goal: Adequate nutrition will be maintained Outcome: Progressing   Problem: Pain Managment: Goal: General experience of comfort will improve and/or be controlled Outcome: Progressing

## 2023-07-23 NOTE — ED Provider Notes (Signed)
 [facility] AT [facility] Provider Note   CSN: [id number] Arrival date & time: 07/23/23  4098     History  Chief Complaint  Patient presents with   Marletta Lor    Stephanie Wood is a 89 y.o. female.  89 year old female with past medical history of IBS and GERD presenting to the emergency department today with feeling unsteady on her feet.  The patient apparently had a fall earlier today.  She states that she felt like she needed to go to the bathroom but was feeling unsteady when she got up to go to the bathroom.  She denies any associated nausea or vomiting.  Denies any chest pain.  States that she has been having loose stools for years and does see GI regarding this.  She reports that she was having some dark stools and did have Hemoccult cards which were positive and she is currently scheduled for a colonoscopy to look into this further.  She denies any frank blood in her stools.  She states that she does have some left lower quadrant abdominal pain.  She states that this is also been going on for years but does seem to be acutely worse over the past few days.  She has been afebrile.  She came to the ER today for further evaluation regarding this.  She states that she was able to get to the ground and sit down so she did not injure herself or hit her head.  She came to the ER for the evaluation regarding this.   Fall Associated symptoms include abdominal pain.       Home Medications Prior to Admission medications   Medication Sig Start Date End Date Taking? Authorizing Provider  ALPRAZolam Prudy Feeler) 0.5 MG tablet Take 0.5 mg by mouth at bedtime.    [provider]  atorvastatin (LIPITOR) 20 MG tablet Take 20 mg by mouth daily.    [provider]  bifidobacterium infantis (ALIGN) capsule Take 1 capsule by mouth daily.    [provider]  Calcium Carbonate (CALCIUM 600 PO) Take 600 mg by mouth daily.    [provider]   dicyclomine (BENTYL) 10 MG capsule Take 1 capsule (10 mg total) by mouth every 12 (twelve) hours as needed for spasms (abdominal pain). 09/25/22   Dolores Frame, MD  ELDERBERRY PO Take 1 capsule by mouth daily.    [provider]  furosemide (LASIX) 20 MG tablet Take 1 tablet (20 mg total) by mouth daily. 05/28/23   Mallipeddi, Vishnu P, MD  loperamide (IMODIUM) 2 MG capsule Take 2 mg by mouth as needed for diarrhea or loose stools. 10/23/17   Rehman, Joline Maxcy, MD  loratadine (CLARITIN) 10 MG tablet Take 1 tablet (10 mg total) by mouth daily. 02/12/23   Billie Lade, MD  methenamine (HIPREX) 1 g tablet TAKE 1 TABLET BY MOUTH TWICE DAILY. 05/04/23   Marcine Matar, MD  omeprazole (PRILOSEC) 40 MG capsule TAKE 1 CAPSULE BY MOUTH DAILY 06/11/23   Marguerita Merles, Reuel Boom, MD  Pediatric Multivitamins-Iron (FLINTSTONES PLUS IRON) chewable tablet Chew 1 tablet by mouth 2 (two) times a week. 07/22/18   Rehman, Joline Maxcy, MD  PROLIA 60 MG/ML SOSY injection Inject 60 mg into the skin every 6 (six) months. 05/01/23   Billie Lade, MD  vitamin B-12 (CYANOCOBALAMIN) 1000 MCG tablet Take 1,000 mcg by mouth daily.    [provider]      Allergies    Drug  class [trazodone and nefazodone] and Macrobid [nitrofurantoin]    Review of Systems   Review of Systems  Gastrointestinal:  Positive for abdominal pain.  Neurological:  Positive for light-headedness.  All other systems reviewed and are negative.   Physical Exam Updated Vital Signs BP (!) 119/48   Pulse 86   Temp 99.8 F (37.7 C) (Oral)   Resp 18   Ht 5\' 3"  (1.6 m)   Wt 68.8 kg   SpO2 96%   BMI 26.87 kg/m  Physical Exam Vitals and nursing note reviewed.   Gen: NAD Eyes: PERRL, EOMI HEENT: no oropharyngeal swelling Neck: trachea midline Resp: clear to auscultation bilaterally Card: RRR, no murmurs, rubs, or gallops Abd: The patient is tender over the left lower quadrant with no guarding or  rebound Extremities: no calf tenderness, no edema Vascular: 2+ radial pulses bilaterally, 2+ DP pulses bilaterally Skin: no rashes Psyc: acting appropriately   ED Results / Procedures / Treatments   Labs (all labs ordered are listed, but only abnormal results are displayed) Labs Reviewed  CBC WITH DIFFERENTIAL/PLATELET - Abnormal; Notable for the following components:      Result Value   WBC 1.3 (*)    RBC 3.21 (*)    Hemoglobin 9.4 (*)    HCT 29.4 (*)    RDW 18.2 (*)    Platelets 50 (*)    Neutro Abs 0.8 (*)    Lymphs Abs 0.4 (*)    All other components within normal limits  COMPREHENSIVE METABOLIC PANEL - Abnormal; Notable for the following components:   Sodium 134 (*)    Potassium 3.0 (*)    Chloride 96 (*)    Glucose, Bld 129 (*)    Albumin 3.2 (*)    GFR, Estimated 60 (*)    All other components within normal limits  URINALYSIS, ROUTINE W REFLEX MICROSCOPIC - Abnormal; Notable for the following components:   Hgb urine dipstick SMALL (*)    All other components within normal limits  RESP PANEL BY RT-PCR (RSV, FLU A&B, COVID)  RVPGX2  LIPASE, BLOOD    EKG None  Radiology DG Chest Portable 1 View Result Date: 07/23/2023 CLINICAL DATA:  Cough.  Dizziness with fall. EXAM: PORTABLE CHEST 1 VIEW COMPARISON:  Radiographs 08/23/2013 and 07/28/2013. Abdominal CT 07/23/2023. FINDINGS: 1153 hours. Low lung volumes. The heart size and mediastinal contours are stable with a moderate size hiatal hernia. There is vascular congestion with mildly increased atelectasis at both lung bases. No confluent airspace disease, pleural effusion or pneumothorax. No acute fractures are demonstrated. IMPRESSION: Low lung volumes with vascular congestion and mildly increased atelectasis at both lung bases. No evidence of pneumonia or pulmonary edema. Electronically Signed   By: Carey Bullocks M.D.   On: 07/23/2023 13:44   CT ABDOMEN PELVIS W CONTRAST Result Date: 07/23/2023 CLINICAL DATA:   89 year old female status post fall. Dizziness. Abdominal pain. EXAM: CT ABDOMEN AND PELVIS WITH CONTRAST TECHNIQUE: Multidetector CT imaging of the abdomen and pelvis was performed using the standard protocol following bolus administration of intravenous contrast. RADIATION DOSE REDUCTION: This exam was performed according to the departmental dose-optimization program which includes automated exposure control, adjustment of the mA and/or kV according to patient size and/or use of iterative reconstruction technique. CONTRAST:  OMNIPAQUE IOHEXOL 300 MG/ML  SOLN COMPARISON:  CT Abdomen and Pelvis 01/10/2022. FINDINGS: Lower chest: Moderate to large gastric hiatal hernia does not appear significantly changed since 2023. Superimposed mild cardiomegaly is stable. No pericardial effusion. Chronic  increased AP dimension to the lungs. Trace new pleural effusions. Patchy, streaky new dependent lung opacity. Hepatobiliary: Negative liver and gallbladder. Pancreas: Partially atrophied. Spleen: Negative. Adrenals/Urinary Tract: Stable since 2023 and negative. Symmetric renal enhancement and early contrast excretion. Benign left renal cysts (no follow-up imaging recommended). Stomach/Bowel: Fluid in the right colon and transverse colon, but decompressed distal transverse through rectum colonic segments. No large bowel wall thickening identified. Appendix remains normal on coronal image 68. Decompressed terminal ileum and no dilated small bowel. Chronic partially intrathoracic stomach. Second portion duodenum chronic diverticulum with no active inflammation. No free air, free fluid, or mesenteric inflammation identified. Vascular/Lymphatic: Mild for age Aortoiliac calcified atherosclerosis. Normal caliber abdominal aorta. Major arterial structures and portal venous system appear to be patent. No lymphadenopathy. Reproductive: Negative. Other: No pelvis free fluid. Small fat containing right inguinal hernia is chronic and  stable. Musculoskeletal: No displaced lower rib fracture is identified. Chronic L1 compression fracture is moderate to severe but stable since 2023. Stable visible vertebral height otherwise. Chronic osteopenia. No acute osseous abnormality identified. IMPRESSION: 1. Trace new pleural effusions and patchy dependent lung opacity which more resembles atelectasis than infection. Superimposed chronic moderate to large gastric hiatal hernia. 2. No acute traumatic injury or acute/inflammatory process identified in the abdomen or pelvis. Normal appendix. Chronic L1 compression fracture. 3.  Aortic Atherosclerosis (ICD10-I70.0). Electronically Signed   By: Odessa Fleming M.D.   On: 07/23/2023 10:30   CT Head Wo Contrast Result Date: 07/23/2023 CLINICAL DATA:  Provided history: Syncope/presyncope, cerebrovascular cause suspected. EXAM: CT HEAD WITHOUT CONTRAST TECHNIQUE: Contiguous axial images were obtained from the base of the skull through the vertex without intravenous contrast. RADIATION DOSE REDUCTION: This exam was performed according to the departmental dose-optimization program which includes automated exposure control, adjustment of the mA and/or kV according to patient size and/or use of iterative reconstruction technique. COMPARISON:  None. FINDINGS: Brain: Generalized cerebral and cerebellar atrophy. Patchy and ill-defined hypoattenuation within the cerebral white matter, nonspecific but compatible with mild chronic small vessel ischemic disease. There is no acute intracranial hemorrhage. No demarcated cortical infarct. No extra-axial fluid collection. No evidence of an intracranial mass. No midline shift. Vascular: No hyperdense vessel. Atherosclerotic calcifications. Skull: No calvarial fracture or aggressive osseous lesion. Sinuses/Orbits: No mass or acute finding within the imaged orbits. Minimal mucosal thickening within the bilateral ethmoid and right sphenoid sinuses. IMPRESSION: 1. No evidence of an acute  intracranial abnormality. 2. Mild chronic small vessel ischemic changes within the cerebral white matter. 3. Generalized parenchymal atrophy. Electronically Signed   By: Jackey Loge D.O.   On: 07/23/2023 10:13    Procedures Procedures    Medications Ordered in ED Medications  pantoprazole (PROTONIX) injection 40 mg (has no administration in time range)  lactated ringers bolus 1,000 mL (0 mLs Intravenous Stopped 07/23/23 1423)  meclizine (ANTIVERT) tablet 12.5 mg (12.5 mg Oral Given 07/23/23 0851)  iohexol (OMNIPAQUE) 300 MG/ML solution 100 mL (100 mLs Intravenous Contrast Given 07/23/23 0951)  acetaminophen (TYLENOL) tablet 650 mg (650 mg Oral Given 07/23/23 1232)    ED Course/ Medical Decision Making/ A&P                                 Medical Decision Making 89 year old female with past medical history of IBS and GERD presenting to the emergency department today with lightheadedness after an episode where she was near syncopal earlier.  I will further evaluate  patient here with basic labs Wels and EKG to eval for electrolyte abnormalities or anemia as well as conduction disturbances.  Will keep patient on the cardiac monitor.  I will obtain a CT scan of her head to evaluate for central cause for dizziness although this seems to be more consistent with lightheadedness.  Will also obtain a CT scan of her abdomen given her left lower quadrant abdominal pain that is worsening.  The patient was incontinent of stool or at least it seems that she was unable to get to the bathroom in time before she did have loose stool.  This does appear to be light brown with no gross bleeding here.  Symptoms may be due to anemia given her known Hemoccult positive results.  I will reevaluate for ultimate disposition.  The patient's hemoglobin has dropped 3 g.  The patient otherwise has reassuring workup.  Given the weakness and hemoglobin drop I did call discuss her case with gastroenterology.  The plan is for  admission.  I will give the patient Protonix.  A call was placed to hospitalist service for admission.  Amount and/or Complexity of Data Reviewed Labs: ordered. Radiology: ordered.  Risk OTC drugs. Prescription drug management.           Final Clinical Impression(s) / ED Diagnoses Final diagnoses:  Anemia, unspecified type  Near syncope    Rx / DC Orders ED Discharge Orders     None         Durwin Glaze, MD 07/23/23 1451

## 2023-07-23 NOTE — ED Notes (Signed)
 Pt 83-84% on room air. Pt placed on 2 L/O2, SPO2 came up to 96%.

## 2023-07-23 NOTE — ED Notes (Signed)
 Pt provided bedpan and able to micturate w/o assistance. Pt IV fluids restarted at this time.

## 2023-07-23 NOTE — H&P (Signed)
 History and Physical    Patient: Stephanie Wood:[id number] DOB: [birth year] DOA: 07/23/2023 DOS: the patient was seen and examined on 07/23/2023 PCP: Billie Lade, MD  Patient coming from: Home  Chief Complaint:  Chief Complaint  Patient presents with   Fall   HPI: Stephanie Wood is a 89 y.o. female with medical history significant of GERD, IBS, anxeity, anemia. Presenting w/ progressive weakness and a mechanical fall. Pt lives at home and cares for self. Meals are brought to pt regularly but endorses poor oral intake due to lack of interes. Denies any acute complaints such as CP, SOB, ABD pain, dysuria, frequency, fever. Pt is attended to by her niece and son in law. Niece states that pt has felt "foggy headed" lately. Pt reports feeling unsteady as she was ambulating to the bathroom. She sat down quickly and did not fall. EMS wass called to eval and bring to ED.        Review of Systems: As mentioned in the history of present illness. All other systems reviewed and are negative. Past Medical History:  Diagnosis Date   Acid reflux disease    Anemia    Anxiety    Colonic adenoma    Depression    Irritable bowel syndrome    Past Surgical History:  Procedure Laterality Date   CATARACT EXTRACTION     COLONOSCOPY N/A 04/15/2014   Procedure: COLONOSCOPY;  Surgeon: Malissa Hippo, MD;  [facility]: [facility];  Service: Endoscopy;  Laterality: N/A;  730   ESOPHAGOGASTRODUODENOSCOPY  09/20/2011   Procedure: ESOPHAGOGASTRODUODENOSCOPY (EGD);  Surgeon: Malissa Hippo, MD;  Location: [facility];  Service: Endoscopy;  Laterality: N/A;  215   ESOPHAGOGASTRODUODENOSCOPY (EGD) WITH PROPOFOL N/A 03/16/2022   Procedure: ESOPHAGOGASTRODUODENOSCOPY (EGD) WITH PROPOFOL;  Surgeon: Dolores Frame, MD;  Location: [facility];  Service: Gastroenterology;  Laterality: N/A;  1230 ASA 3   EYE SURGERY     bil catatract surgery   Social History:  reports that she has never smoked. She  has never been exposed to tobacco smoke. She has never used smokeless tobacco. She reports that she does not drink alcohol and does not use drugs.  Allergies  Allergen Reactions   Drug Class [Trazodone And Nefazodone]    Macrobid [Nitrofurantoin] Other (See Comments)    Elevated LFT's    Family History  Problem Relation Age of Onset   Prostate cancer Father     Prior to Admission medications   Medication Sig Start Date End Date Taking? Authorizing Provider  ALPRAZolam Prudy Feeler) 0.5 MG tablet Take 0.5 mg by mouth at bedtime.    [provider]  atorvastatin (LIPITOR) 20 MG tablet Take 20 mg by mouth daily.    [provider]  bifidobacterium infantis (ALIGN) capsule Take 1 capsule by mouth daily.    [provider]  Calcium Carbonate (CALCIUM 600 PO) Take 600 mg by mouth daily.    [provider]  dicyclomine (BENTYL) 10 MG capsule Take 1 capsule (10 mg total) by mouth every 12 (twelve) hours as needed for spasms (abdominal pain). 09/25/22   Dolores Frame, MD  ELDERBERRY PO Take 1 capsule by mouth daily.    [provider]  furosemide (LASIX) 20 MG tablet Take 1 tablet (20 mg total) by mouth daily. 05/28/23   Mallipeddi, Vishnu P, MD  loperamide (IMODIUM) 2 MG capsule Take 2 mg by mouth as needed for diarrhea or loose stools. 10/23/17   Rehman,  Joline Maxcy, MD  loratadine (CLARITIN) 10 MG tablet Take 1 tablet (10 mg total) by mouth daily. 02/12/23   Billie Lade, MD  methenamine (HIPREX) 1 g tablet TAKE 1 TABLET BY MOUTH TWICE DAILY. 05/04/23   Marcine Matar, MD  omeprazole (PRILOSEC) 40 MG capsule TAKE 1 CAPSULE BY MOUTH DAILY 06/11/23   Marguerita Merles, Reuel Boom, MD  Pediatric Multivitamins-Iron (FLINTSTONES PLUS IRON) chewable tablet Chew 1 tablet by mouth 2 (two) times a week. 07/22/18   Rehman, Joline Maxcy, MD  PROLIA 60 MG/ML SOSY injection Inject 60 mg into the skin every 6 (six) months. 05/01/23   Billie Lade, MD  vitamin  B-12 (CYANOCOBALAMIN) 1000 MCG tablet Take 1,000 mcg by mouth daily.    [provider]    Physical Exam: Vitals:   07/23/23 1400 07/23/23 1515 07/23/23 1545 07/23/23 1615  BP: (!) 106/44 (!) 106/51 105/77 (!) 116/47  Pulse: 80 80 78 75  Resp: (!) 22 12 20 16   Temp:   98.3 F (36.8 C) 98.3 F (36.8 C)  TempSrc:   Oral Oral  SpO2: 93% 96% 95% 98%  Weight:      Height:       General: Elderly. Appears calm and comfortable Eyes:  PERRL, EOMI, normal lids, iris ENT:  grossly normal hearing, lips & tongue, mmm Neck:  no LAD, masses or thyromegaly Cardiovascular:  Faint heart sounds. RRR. No LE edema.  Respiratory:  CTA bilaterally, no w/r/r. Normal respiratory effort. Abdomen:  soft, ntnd, NABS Skin:  no rash or induration seen on limited exam Musculoskeletal:  grossly normal tone BUE/BLE, No bony abnormality Psychiatric:  grossly normal mood and affect, speech fluent and appropriate, AOx3 Neurologic:  CN 2-12 grossly intact, moves all extremities in coordinated fashion, sensation intact  Data Reviewed: DG Chest Portable 1 View Result Date: 07/23/2023 CLINICAL DATA:  Cough.  Dizziness with fall. EXAM: PORTABLE CHEST 1 VIEW COMPARISON:  Radiographs 08/23/2013 and 07/28/2013. Abdominal CT 07/23/2023. FINDINGS: 1153 hours. Low lung volumes. The heart size and mediastinal contours are stable with a moderate size hiatal hernia. There is vascular congestion with mildly increased atelectasis at both lung bases. No confluent airspace disease, pleural effusion or pneumothorax. No acute fractures are demonstrated. IMPRESSION: Low lung volumes with vascular congestion and mildly increased atelectasis at both lung bases. No evidence of pneumonia or pulmonary edema. Electronically Signed   By: Carey Bullocks M.D.   On: 07/23/2023 13:44   CT ABDOMEN PELVIS W CONTRAST Result Date: 07/23/2023 CLINICAL DATA:  89 year old female status post fall. Dizziness. Abdominal pain. EXAM: CT ABDOMEN AND  PELVIS WITH CONTRAST TECHNIQUE: Multidetector CT imaging of the abdomen and pelvis was performed using the standard protocol following bolus administration of intravenous contrast. RADIATION DOSE REDUCTION: This exam was performed according to the departmental dose-optimization program which includes automated exposure control, adjustment of the mA and/or kV according to patient size and/or use of iterative reconstruction technique. CONTRAST:  OMNIPAQUE IOHEXOL 300 MG/ML  SOLN COMPARISON:  CT Abdomen and Pelvis 01/10/2022. FINDINGS: Lower chest: Moderate to large gastric hiatal hernia does not appear significantly changed since 2023. Superimposed mild cardiomegaly is stable. No pericardial effusion. Chronic increased AP dimension to the lungs. Trace new pleural effusions. Patchy, streaky new dependent lung opacity. Hepatobiliary: Negative liver and gallbladder. Pancreas: Partially atrophied. Spleen: Negative. Adrenals/Urinary Tract: Stable since 2023 and negative. Symmetric renal enhancement and early contrast excretion. Benign left renal cysts (no follow-up imaging recommended). Stomach/Bowel: Fluid in the right colon and transverse  colon, but decompressed distal transverse through rectum colonic segments. No large bowel wall thickening identified. Appendix remains normal on coronal image 68. Decompressed terminal ileum and no dilated small bowel. Chronic partially intrathoracic stomach. Second portion duodenum chronic diverticulum with no active inflammation. No free air, free fluid, or mesenteric inflammation identified. Vascular/Lymphatic: Mild for age Aortoiliac calcified atherosclerosis. Normal caliber abdominal aorta. Major arterial structures and portal venous system appear to be patent. No lymphadenopathy. Reproductive: Negative. Other: No pelvis free fluid. Small fat containing right inguinal hernia is chronic and stable. Musculoskeletal: No displaced lower rib fracture is identified. Chronic L1  compression fracture is moderate to severe but stable since 2023. Stable visible vertebral height otherwise. Chronic osteopenia. No acute osseous abnormality identified. IMPRESSION: 1. Trace new pleural effusions and patchy dependent lung opacity which more resembles atelectasis than infection. Superimposed chronic moderate to large gastric hiatal hernia. 2. No acute traumatic injury or acute/inflammatory process identified in the abdomen or pelvis. Normal appendix. Chronic L1 compression fracture. 3.  Aortic Atherosclerosis (ICD10-I70.0). Electronically Signed   By: Odessa Fleming M.D.   On: 07/23/2023 10:30   CT Head Wo Contrast Result Date: 07/23/2023 CLINICAL DATA:  Provided history: Syncope/presyncope, cerebrovascular cause suspected. EXAM: CT HEAD WITHOUT CONTRAST TECHNIQUE: Contiguous axial images were obtained from the base of the skull through the vertex without intravenous contrast. RADIATION DOSE REDUCTION: This exam was performed according to the departmental dose-optimization program which includes automated exposure control, adjustment of the mA and/or kV according to patient size and/or use of iterative reconstruction technique. COMPARISON:  None. FINDINGS: Brain: Generalized cerebral and cerebellar atrophy. Patchy and ill-defined hypoattenuation within the cerebral white matter, nonspecific but compatible with mild chronic small vessel ischemic disease. There is no acute intracranial hemorrhage. No demarcated cortical infarct. No extra-axial fluid collection. No evidence of an intracranial mass. No midline shift. Vascular: No hyperdense vessel. Atherosclerotic calcifications. Skull: No calvarial fracture or aggressive osseous lesion. Sinuses/Orbits: No mass or acute finding within the imaged orbits. Minimal mucosal thickening within the bilateral ethmoid and right sphenoid sinuses. IMPRESSION: 1. No evidence of an acute intracranial abnormality. 2. Mild chronic small vessel ischemic changes within the  cerebral white matter. 3. Generalized parenchymal atrophy. Electronically Signed   By: Jackey Loge D.O.   On: 07/23/2023 10:13     Assessment and Plan: 88yoF presenting w/ progressive weakness w/ known GI bleed found to have pancytopenia and fever of unknown origin.   GI bleed: positive in past. Colo done on 07/10/23 w/o acute abnormality noted. Pt w/ EGD pending stabilization of condition. GI following.  - EGD per GI - IV Protonix and Pepcid - Carafate  - H.Pylori  Pancytopenia: Suspect multifactorial including GI blood loss, poor nutrition, and possible infection (EBV - pt w/ unexplained fever, fatigue and head fog). Doubt malignancy - Tx GI bleed as above.  - Pre-albumin to help determine nutritional state - EBV panel - CBC in am. Consider H/O consult  Fever of unknown origin: Pt w/ generalized complaint of malaise, foggy headed feeling for days and fever of unknown duration. CT head/abd/plv and CXR w/o acute process noted. UA unremarkable. No acute complaint. Pulmonary remains an area of high concern especially once hydrated.  - Hold ABX for now - EBV panel - repeat labs in am - CXR in am  Electrolyte abnormality: Na 134, K3, Cl 96. Suspect from GI loss from pts standard diarrhea and very poor oral intake.  - Hold home lasix - IVF 180ml/hr x12hr - Chemistry in  am - Kdur  Hyperglycemia: Mild. Likely from acute illness.  - A1c  GERD: chronic and at baseline - Mgt as above  Le edema: Echo from 2023 showing EF 60% and Grade 1 Diastolic dysfunction. No evidence of fluid overload.  - Hold hom lasix  Chronic UTI: No active complaints. UA relatively unremarkable.  - Hold home Methenamine  Osteopenia: on Prolia - [place] Prolia  Anxiety: - continue home Xanax PRN.    Advance Care Planning:   Code Status: Full Code Full  Consults: GI  Family Communication: Niece and Son-in-law  Severity of Illness: The appropriate patient status for this patient is INPATIENT. Inpatient  status is judged to be reasonable and necessary in order to provide the required intensity of service to ensure the patient's safety. The patient's presenting symptoms, physical exam findings, and initial radiographic and laboratory data in the context of their chronic comorbidities is felt to place them at high risk for further clinical deterioration. Furthermore, it is not anticipated that the patient will be medically stable for discharge from the hospital within 2 midnights of admission.   * I certify that at the point of admission it is my clinical judgment that the patient will require inpatient hospital care spanning beyond 2 midnights from the point of admission due to high intensity of service, high risk for further deterioration and high frequency of surveillance required.*  Author: Ozella Rocks, MD 07/23/2023 5:31 PM  For on call review [email or web address].

## 2023-07-24 ENCOUNTER — Inpatient Hospital Stay (HOSPITAL_COMMUNITY): Payer: PPO

## 2023-07-24 DIAGNOSIS — R195 Other fecal abnormalities: Secondary | ICD-10-CM | POA: Diagnosis not present

## 2023-07-24 DIAGNOSIS — D61818 Other pancytopenia: Secondary | ICD-10-CM | POA: Diagnosis not present

## 2023-07-24 DIAGNOSIS — K922 Gastrointestinal hemorrhage, unspecified: Secondary | ICD-10-CM | POA: Diagnosis not present

## 2023-07-24 LAB — CBC WITH DIFFERENTIAL/PLATELET
Abs Immature Granulocytes: 0.04 10*3/uL (ref 0.00–0.07)
Basophils Absolute: 0 10*3/uL (ref 0.0–0.1)
Basophils Relative: 0 %
Eosinophils Absolute: 0 10*3/uL (ref 0.0–0.5)
Eosinophils Relative: 1 %
HCT: 23.4 % — ABNORMAL LOW (ref 36.0–46.0)
Hemoglobin: 7.4 g/dL — ABNORMAL LOW (ref 12.0–15.0)
Immature Granulocytes: 2 %
Lymphocytes Relative: 34 %
Lymphs Abs: 0.6 10*3/uL — ABNORMAL LOW (ref 0.7–4.0)
MCH: 29.5 pg (ref 26.0–34.0)
MCHC: 31.6 g/dL (ref 30.0–36.0)
MCV: 93.2 fL (ref 80.0–100.0)
Monocytes Absolute: 0.1 10*3/uL (ref 0.1–1.0)
Monocytes Relative: 8 %
Neutro Abs: 1 10*3/uL — ABNORMAL LOW (ref 1.7–7.7)
Neutrophils Relative %: 55 %
Platelets: 35 10*3/uL — ABNORMAL LOW (ref 150–400)
RBC: 2.51 MIL/uL — ABNORMAL LOW (ref 3.87–5.11)
RDW: 18.4 % — ABNORMAL HIGH (ref 11.5–15.5)
Smear Review: DECREASED
WBC: 1.7 10*3/uL — ABNORMAL LOW (ref 4.0–10.5)
nRBC: 0 % (ref 0.0–0.2)

## 2023-07-24 LAB — TSH: TSH: 0.473 u[IU]/mL (ref 0.350–4.500)

## 2023-07-24 LAB — BILIRUBIN, FRACTIONATED(TOT/DIR/INDIR)
Bilirubin, Direct: 0.3 mg/dL — ABNORMAL HIGH (ref 0.0–0.2)
Indirect Bilirubin: 0.9 mg/dL (ref 0.3–0.9)
Total Bilirubin: 1.2 mg/dL (ref 0.0–1.2)

## 2023-07-24 LAB — COMPREHENSIVE METABOLIC PANEL
ALT: 16 U/L (ref 0–44)
AST: 31 U/L (ref 15–41)
Albumin: 2.5 g/dL — ABNORMAL LOW (ref 3.5–5.0)
Alkaline Phosphatase: 81 U/L (ref 38–126)
Anion gap: 9 (ref 5–15)
BUN: 11 mg/dL (ref 8–23)
CO2: 24 mmol/L (ref 22–32)
Calcium: 7.9 mg/dL — ABNORMAL LOW (ref 8.9–10.3)
Chloride: 104 mmol/L (ref 98–111)
Creatinine, Ser: 0.89 mg/dL (ref 0.44–1.00)
GFR, Estimated: >60 mL/min (ref >60)
Glucose, Bld: 101 mg/dL — ABNORMAL HIGH (ref 70–99)
Potassium: 3.2 mmol/L — ABNORMAL LOW (ref 3.5–5.1)
Sodium: 137 mmol/L (ref 135–145)
Total Bilirubin: 1.4 mg/dL — ABNORMAL HIGH (ref 0.0–1.2)
Total Protein: 6 g/dL — ABNORMAL LOW (ref 6.5–8.1)

## 2023-07-24 LAB — PREALBUMIN: Prealbumin: <5 mg/dL — ABNORMAL LOW (ref 18–38)

## 2023-07-24 LAB — TECHNOLOGIST SMEAR REVIEW: Plt Morphology: DECREASED

## 2023-07-24 LAB — LACTIC ACID, PLASMA: Lactic Acid, Venous: 1.1 mmol/L (ref 0.5–1.9)

## 2023-07-24 LAB — VITAMIN B12: Vitamin B-12: 893 pg/mL (ref 180–914)

## 2023-07-24 LAB — FOLATE: Folate: 14.4 ng/mL (ref >5.9)

## 2023-07-24 LAB — LACTATE DEHYDROGENASE: LDH: 242 U/L — ABNORMAL HIGH (ref 98–192)

## 2023-07-24 MED ORDER — PANTOPRAZOLE SODIUM 40 MG IV SOLR
40.0000 mg | Freq: Two times a day (BID) | INTRAVENOUS | Status: DC
Start: 2023-07-25 — End: 2023-07-24

## 2023-07-24 MED ORDER — MAGNESIUM SULFATE 2 GM/50ML IV SOLN
2.0000 g | Freq: Once | INTRAVENOUS | Status: AC
  Administered 2023-07-24: 2 g via INTRAVENOUS
  Filled 2023-07-24: qty 50

## 2023-07-24 MED ORDER — ORAL CARE MOUTH RINSE: 15.0000 mL | OROMUCOSAL | Status: DC | PRN

## 2023-07-24 MED ORDER — SODIUM PHOSPHATES 45 MMOLE/15ML IV SOLN
30.0000 mmol | Freq: Once | INTRAVENOUS | Status: AC
  Administered 2023-07-24: 30 mmol via INTRAVENOUS
  Filled 2023-07-24: qty 10

## 2023-07-24 MED ORDER — VANCOMYCIN HCL 1500 MG/300ML IV SOLN
1500.0000 mg | Freq: Once | INTRAVENOUS | Status: AC
  Administered 2023-07-24: 1500 mg via INTRAVENOUS
  Filled 2023-07-24: qty 300

## 2023-07-24 MED ORDER — CHLORHEXIDINE GLUCONATE CLOTH 2 % EX PADS
6.0000 | MEDICATED_PAD | Freq: Every day | CUTANEOUS | Status: DC
  Administered 2023-07-24 – 2023-08-01 (×9): 6 via TOPICAL

## 2023-07-24 MED ORDER — SODIUM CHLORIDE 0.9 % IV SOLN
2.0000 g | INTRAVENOUS | Status: DC
  Administered 2023-07-24 – 2023-07-25 (×2): 2 g via INTRAVENOUS
  Filled 2023-07-24 (×2): qty 20

## 2023-07-24 MED ORDER — PNEUMOCOCCAL 20-VAL CONJ VACC 0.5 ML IM SUSY
0.5000 mL | PREFILLED_SYRINGE | INTRAMUSCULAR | Status: AC
  Administered 2023-07-26: 0.5 mL via INTRAMUSCULAR
  Filled 2023-07-24: qty 0.5

## 2023-07-24 MED ORDER — PANTOPRAZOLE SODIUM 40 MG IV SOLR
40.0000 mg | Freq: Two times a day (BID) | INTRAVENOUS | Status: DC
  Administered 2023-07-24 – 2023-07-27 (×7): 40 mg via INTRAVENOUS
  Filled 2023-07-24 (×7): qty 10

## 2023-07-24 MED ORDER — VANCOMYCIN HCL 750 MG/150ML IV SOLN
750.0000 mg | INTRAVENOUS | Status: DC
  Administered 2023-07-25: 750 mg via INTRAVENOUS
  Filled 2023-07-24: qty 150

## 2023-07-24 NOTE — Progress Notes (Addendum)
 Subjective: Reports she is feeling prett good today. Drank some orange juice and enjoyed this. Doesn't have much of an appetite. Started around the time of dizziness. States she has had fever off and on for the last 2 weeks. No abdominal pain. Mild nausea this morning, but no vomiting. Had a BM this morning. No recurrent melena. No brbpr. No increased diarrhea recently. Reports about 2 Bms per day that are loose which is chronic. Uses imodium a couple times a week which is also chronic.   Denies SOB. Reports she does have cough today. No CP, urinary symptoms.   Objective: Vital signs in last 24 hours: Temp:  [97.7 F (36.5 C)-101.9 F (38.8 C)] 99.8 F (37.7 C) (02/25 0355) Pulse Rate:  [75-93] 92 (02/25 0355) Resp:  [12-25] 16 (02/25 0355) BP: (105-121)/(44-77) 112/52 (02/25 0355) SpO2:  [93 %-99 %] 94 % (02/25 0355) Last BM Date : 07/24/23 General:   Alert and oriented, pleasant, frail.  Head:  Normocephalic and atraumatic. Eyes:  No icterus, sclera clear. Conjuctiva pink.  Abdomen:  Bowel sounds present, soft, non-tender, non-distended. No HSM or hernias noted. No rebound or guarding. No masses appreciated  Neurologic:  Alert and  oriented x4. Skin:  Warm and dry, intact without significant lesions.  Psych:  Normal mood and affect.  Intake/Output from previous day: 02/24 0701 - 02/25 0700 In: 1880.3 [I.V.:830.3; IV Piggyback:1050] Out: 350 [Urine:350] Intake/Output this shift: No intake/output data recorded.  Lab Results: Recent Labs    07/23/23 0842 07/24/23 0526  WBC 1.3* 1.7*  HGB 9.4* 7.4*  HCT 29.4* 23.4*  PLT 50* 35*   BMET Recent Labs    07/23/23 0842 07/24/23 0526  NA 134* 137  K 3.0* 3.2*  CL 96* 104  CO2 26 24  GLUCOSE 129* 101*  BUN 12 11  CREATININE 0.93 0.89  CALCIUM 9.0 7.9*   LFT Recent Labs    07/23/23 0842 07/24/23 0526  PROT 7.5 6.0*  ALBUMIN 3.2* 2.5*  AST 25 31  ALT 19 16  ALKPHOS 95 81  BILITOT 1.1 1.4*      Studies/Results: DG Chest Portable 1 View Result Date: 07/23/2023 CLINICAL DATA:  Cough.  Dizziness with fall. EXAM: PORTABLE CHEST 1 VIEW COMPARISON:  Radiographs 08/23/2013 and 07/28/2013. Abdominal CT 07/23/2023. FINDINGS: 1153 hours. Low lung volumes. The heart size and mediastinal contours are stable with a moderate size hiatal hernia. There is vascular congestion with mildly increased atelectasis at both lung bases. No confluent airspace disease, pleural effusion or pneumothorax. No acute fractures are demonstrated. IMPRESSION: Low lung volumes with vascular congestion and mildly increased atelectasis at both lung bases. No evidence of pneumonia or pulmonary edema. Electronically Signed   By: Carey Bullocks M.D.   On: 07/23/2023 13:44   CT ABDOMEN PELVIS W CONTRAST Result Date: 07/23/2023 CLINICAL DATA:  89 year old female status post fall. Dizziness. Abdominal pain. EXAM: CT ABDOMEN AND PELVIS WITH CONTRAST TECHNIQUE: Multidetector CT imaging of the abdomen and pelvis was performed using the standard protocol following bolus administration of intravenous contrast. RADIATION DOSE REDUCTION: This exam was performed according to the departmental dose-optimization program which includes automated exposure control, adjustment of the mA and/or kV according to patient size and/or use of iterative reconstruction technique. CONTRAST:  OMNIPAQUE IOHEXOL 300 MG/ML  SOLN COMPARISON:  CT Abdomen and Pelvis 01/10/2022. FINDINGS: Lower chest: Moderate to large gastric hiatal hernia does not appear significantly changed since 2023. Superimposed mild cardiomegaly is stable. No pericardial effusion. Chronic  increased AP dimension to the lungs. Trace new pleural effusions. Patchy, streaky new dependent lung opacity. Hepatobiliary: Negative liver and gallbladder. Pancreas: Partially atrophied. Spleen: Negative. Adrenals/Urinary Tract: Stable since 2023 and negative. Symmetric renal enhancement and early  contrast excretion. Benign left renal cysts (no follow-up imaging recommended). Stomach/Bowel: Fluid in the right colon and transverse colon, but decompressed distal transverse through rectum colonic segments. No large bowel wall thickening identified. Appendix remains normal on coronal image 68. Decompressed terminal ileum and no dilated small bowel. Chronic partially intrathoracic stomach. Second portion duodenum chronic diverticulum with no active inflammation. No free air, free fluid, or mesenteric inflammation identified. Vascular/Lymphatic: Mild for age Aortoiliac calcified atherosclerosis. Normal caliber abdominal aorta. Major arterial structures and portal venous system appear to be patent. No lymphadenopathy. Reproductive: Negative. Other: No pelvis free fluid. Small fat containing right inguinal hernia is chronic and stable. Musculoskeletal: No displaced lower rib fracture is identified. Chronic L1 compression fracture is moderate to severe but stable since 2023. Stable visible vertebral height otherwise. Chronic osteopenia. No acute osseous abnormality identified. IMPRESSION: 1. Trace new pleural effusions and patchy dependent lung opacity which more resembles atelectasis than infection. Superimposed chronic moderate to large gastric hiatal hernia. 2. No acute traumatic injury or acute/inflammatory process identified in the abdomen or pelvis. Normal appendix. Chronic L1 compression fracture. 3.  Aortic Atherosclerosis (ICD10-I70.0). Electronically Signed   By: Odessa Fleming M.D.   On: 07/23/2023 10:30   CT Head Wo Contrast Result Date: 07/23/2023 CLINICAL DATA:  Provided history: Syncope/presyncope, cerebrovascular cause suspected. EXAM: CT HEAD WITHOUT CONTRAST TECHNIQUE: Contiguous axial images were obtained from the base of the skull through the vertex without intravenous contrast. RADIATION DOSE REDUCTION: This exam was performed according to the departmental dose-optimization program which includes  automated exposure control, adjustment of the mA and/or kV according to patient size and/or use of iterative reconstruction technique. COMPARISON:  None. FINDINGS: Brain: Generalized cerebral and cerebellar atrophy. Patchy and ill-defined hypoattenuation within the cerebral white matter, nonspecific but compatible with mild chronic small vessel ischemic disease. There is no acute intracranial hemorrhage. No demarcated cortical infarct. No extra-axial fluid collection. No evidence of an intracranial mass. No midline shift. Vascular: No hyperdense vessel. Atherosclerotic calcifications. Skull: No calvarial fracture or aggressive osseous lesion. Sinuses/Orbits: No mass or acute finding within the imaged orbits. Minimal mucosal thickening within the bilateral ethmoid and right sphenoid sinuses. IMPRESSION: 1. No evidence of an acute intracranial abnormality. 2. Mild chronic small vessel ischemic changes within the cerebral white matter. 3. Generalized parenchymal atrophy. Electronically Signed   By: Jackey Loge D.O.   On: 07/23/2023 10:13    Assessment: 89 y.o. 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 GI consulted due to anemia.   New onset anemia with pancytopenia:  Hgb 9.4 on admission, down from 12.30 January 2023 but also with pancytopenia, WBC 1.3 and platelets 50, down from 117 in September 2024. Hgb declined to 7.4 today, WBC stable at 1.7, platelets 35. No B12 or folate deficiency. Notably, CT this admission with normal spleen.   Patient reported black stools several weeks ago with last episode 2/11, heme positive as outpatient. No  overt GI bleeding  since presenting to the hospital. Denies NSAIDs.  While she may have recently experienced an upper GI bleed with differentials including cameron lesions, PUD, AMVs, etc vs bleeding from right sided colonic lesion contributing to declining hemoglobin, this wouldn't explain pancytopenia. Some concern about underlying hematologic process vs infection in light of fever of unknown origin. It is possible that IV fluids given yesterday accounts for some of the decline see over the last 24 hours.   In light of melena and heme positive stool, we can consider EGD/colonoscopy once optimized medically.   Fever of unknown origin: T Max in the last 24 hours was 101.9. 99.8 this morning. Pancytopenic. CT A/P without acute abnormalities. CXR yesterday with vascular congestion. No pneumonia or pulmonary edema. Respiratory panel negative. UA without infection. Abx on hold at this time. Repeat CXR pending this morning along with EBV, blood cultures, lactic acid.   Elevated total bilirubin:  Likely related to acute illness. Will fractionate. Normal liver and gallbladder on CT. LFTs normal.   Hypokalemia:  K 3.2 this morning, up from 3.0 yesterday. Replacement per hospitalist.    Plan: Clear liquid diet PPI BID Fractionate bilirubin Check iron panel tomorrow morning.  Continue to monitor H/H closely and for overt GI bleeding.  Transfuse for Hgb <7.  Work-up for fever of unknown origin per hospitalist.  Replacement of K per hospitalist.  EGD/TCS once medically optimized.  May need to consider getting hematology involved.    LOS: 1 day    07/24/2023, 8:29 AM   Ermalinda Memos, PA-C [facility]   Gastroenterology attending attestation note: Agree with the below findings as written. The patient was presented to me by the APP (Advanced Practice Provider) I personally reviewed the medical chart and evaluated the patient. I personally evaluated and examined the patient by  myself.  I agree with Mrs. Gwendalyn Ege Harper's H/P and assessment.  The patient has felt relatively well although she had some nausea in the morning but no melena, hematochezia, abdominal pain.  Did not have any fever in the afternoon.  Notably, she has presented new onset pancytopenia of unclear etiology.  Currently having severe thrombocytopenia.  Had previous episodes of possible melena with positive FOBT in the past but these have actually subsided.  I consider that given the presence of pancytopenia, other etiologies for current presentation should be evaluated first, which will require a hematology workup and formal hematology consultation.    Will recommend for now to get a peripheral smear, LDH and haptoglobin, while trending daily CBC as it is not safe to proceed with endoscopic procedures if platelet count is less than 50,000.  Katrinka Blazing, MD Gastroenterology and Hepatology [facility]

## 2023-07-24 NOTE — Progress Notes (Addendum)
   07/24/23 1600  TOC Brief Assessment  Insurance and Status Reviewed  Patient has primary care physician Yes  Home environment has been reviewed Home alone  Prior level of function: independent  Prior/Current Home Services No current home services  Social Drivers of Health Review SDOH reviewed no interventions necessary  Readmission risk has been reviewed Yes  Transition of care needs no transition of care needs at this time   Move to ICU, PT eval pending.  Transition of Care Department [facility]) has reviewed patient and no TOC needs have been identified at this time. We will continue to monitor patient advancement through interdisciplinary progression rounds. If new patient transition needs arise, please place a TOC consult.

## 2023-07-24 NOTE — Progress Notes (Addendum)
 Pharmacy Antibiotic Note  Stephanie Wood is a 89 y.o. female admitted on 07/23/2023 with  sepsis- unknown source .  Pharmacy has been consulted for vancomycin dosing.  Plan: Vancomycin 1500 mg IV x 1 dose. Vancomycin 750 mg IV every 24 hours Cefepime 2000 mg IV every 12 hours. Monitor labs, c/s, and vanco levels as indicated.  Height: 5\' 3"  (160 cm) Weight: 68.8 kg (151 lb 10.8 oz) IBW/kg (Calculated) : 52.4  Temp (24hrs), Avg:99.3 F (37.4 C), Min:97.7 F (36.5 C), Max:101.9 F (38.8 C)  Recent Labs  Lab 07/23/23 0842 07/24/23 0526 07/24/23 0927  WBC 1.3* 1.7*  --   CREATININE 0.93 0.89  --   LATICACIDVEN  --   --  1.1    Estimated Creatinine Clearance: 42.3 mL/min (by C-G formula based on SCr of 0.89 mg/dL).    Allergies  Allergen Reactions   Drug Class [Trazodone And Nefazodone]    Macrobid [Nitrofurantoin] Other (See Comments)    Elevated LFT's    Antimicrobials this admission: Cefepime 2/26 >> Vanco 2/25 >> CTX 2/25 >>2/26  Microbiology results: 2/26 Bcx: pending 2/25 BCx: ngtd MRSA PCR: neg   Thank you for allowing pharmacy to be a part of this patient's care.  Judeth Cornfield, PharmD Clinical Pharmacist 07/24/2023 11:22 AM

## 2023-07-24 NOTE — Plan of Care (Signed)

## 2023-07-24 NOTE — Plan of Care (Signed)

## 2023-07-24 NOTE — Progress Notes (Signed)
 PROGRESS NOTE  Stephanie Wood  DOB: 1934/09/30  PCP: Stephanie Lade, MD WUJ:[id number]  DOA: 07/23/2023  LOS: 1 day  Hospital Day: 2  Brief narrative: Stephanie Wood is a 89 y.o. 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 Patient lives at home and able to take care of her ADLs. 2/11, seen by GI as an outpatient with concern of black stools, poor appetite. Elective EGD was planned for March. Patient continued to have black stool leading to progressive weakness.   2/24, patient felt dizzy, unsteady and had a fall and hence brought to the ED by EMS from home.  In the ED, patient had a fever of 101.2, heart rate in 90s, blood pressure 128/58, breathing on 2 L oxygen by nasal cannula. Labs with WBC count 1.3, hemoglobin 9.4, platelets 50, sodium 134, potassium 3, magnesium 1.4, phosphorus 1.5. Urinalysis unremarkable Fecal occult blood positive CT abdomen pelvis with contrast showed chronic moderate to large gastric hiatal hernia, spleen normal CT head showed mild chronic small vessel ischemic changes, generalized parenchymal atrophy. Admitted to [facility] GI was consulted  Subjective: Patient was seen and examined this morning. Elderly Caucasian female.  Propped up in bed.  On low-flow oxygen.  Alert, awake and oriented x 3 but slow to respond. Her niece was at bedside. Chart reviewed. Had recurrence of fever last night, Tmax 101.9  Assessment and plan: New onset anemia Heme positive stool Presented with progressive weakness, poor appetite, black stool and a drop in hemoglobin. Baseline hemoglobin 12.3 from September 2024, hemoglobin on admission 9.4.  Also has low WBC count and low platelet count FOBT positive but no overtly bleeding. GI consulted. Probably needs EGD/colonoscopy Noted further drop in hemoglobin to 7.4 today. Continue IV Protonix 40 mg twice daily Recent  Labs    02/12/23 1142 07/23/23 0842 07/24/23 0526  HGB 12.3 9.4* 7.4*  MCV 87 91.6 93.2  VITAMINB12 1,062  --  893  FOLATE 13.1  --  14.4   Pancytopenia All cell lines are depressed. Last blood work from September 2024 with normal WBC count, hemoglobin and platelet only mildly low at 117.   Unclear etiology -sepsis related versus possible underlying malignancy Blood culture sent.  If it is infection related, counts should improve gradually.  If workup negative for infection, primary malignancy has to be considered.  Discussed with oncologist Dr. Ellin Saba. Recent Labs  Lab 07/23/23 0842 07/24/23 0526  WBC 1.3* 1.7*  NEUTROABS 0.8* 1.0*  HGB 9.4* 7.4*  HCT 29.4* 23.4*  MCV 91.6 93.2  PLT 50* 35*   Sepsis - POA Multiple fever spikes in last 24 hours with leukocytopenia Infectious etiology versus underlying malignancy. Send blood cultures Obtain lactic acid level Start empiric antibiotic with IV Rocephin and IV vancomycin Recent Labs  Lab 07/23/23 0842 07/24/23 0526 07/24/23 0927  WBC 1.3* 1.7*  --   LATICACIDVEN  --   --  1.1   Hypokalemia/hypomagnesemia/hypophosphatemia Oral and IV replacement given. Recent Labs  Lab 07/23/23 0842 07/24/23 0526  K 3.0* 3.2*  MG 1.4*  --   PHOS 1.5*  --    IBS-D At home on Bentyl, Imodium  Lower extremity edema Echo from 2023 showing EF 60% and Grade 1 Diastolic dysfunction. No evidence of fluid overload.  At home on Lasix 20 g daily Currently on hold   Chronic UTI No active complaints. UA relatively unremarkable.  Hold home Methenamine  Anxiety continue home Xanax PRN.   HLD Statin   Mobility: Independent at home.  Week now.  Needs PT eval  Goals of care   Code Status: Limited: Do not attempt resuscitation (DNR) -DNR-LIMITED -Do Not Intubate/DNI   I discussed this with patient and her niece at bedside this morning.  Patient very clearly stated that she is a DNR/DNI.  She understands the meaning and implication  of it.   DVT prophylaxis:  SCDs Start: 07/23/23 1717   Antimicrobials: IV Rocephin and IV vancomycin Fluid: Currently on NS at 125 mL/h Consultants: GI, hematology Family Communication: Niece at bedside  Status: Inpatient Level of care:  Stepdown.  Upgrade from MedSurg to stepdown because of sepsis  Patient is from: Home Needs to continue in-hospital care: Febrile, workup needed.  GI workup and hematology workup needed as well. Anticipated d/c to: Pending clinical course    Diet:  Diet Order             Diet clear liquid Room service appropriate? Yes; Fluid consistency: Thin  Diet effective now                   Scheduled Meds:  ALPRAZolam  0.5 mg Oral QHS   atorvastatin  20 mg Oral Daily   Chlorhexidine Gluconate Cloth  6 each Topical Daily   [START ON 07/25/2023] pantoprazole (PROTONIX) IV  40 mg Intravenous Q12H   [START ON 07/25/2023] pneumococcal 20-valent conjugate vaccine  0.5 mL Intramuscular Tomorrow-1000   potassium chloride  40 mEq Oral BID    PRN meds: acetaminophen **OR** acetaminophen, dicyclomine, loperamide, methocarbamol (ROBAXIN) injection, ondansetron **OR** ondansetron (ZOFRAN) IV, mouth rinse   Infusions:   sodium chloride Stopped (07/24/23 1105)   cefTRIAXone (ROCEPHIN)  IV 200 mL/hr at 07/24/23 1139   famotidine (PEPCID) IV Stopped (07/23/23 1822)   sodium phosphate 30 mmol in sodium chloride 0.9 % 250 mL infusion 30 mmol (07/24/23 1139)   vancomycin 150 mL/hr at 07/24/23 1139   [START ON 07/25/2023] vancomycin      Antimicrobials: Anti-infectives (From admission, onward)    Start     Dose/Rate Route Frequency Ordered Stop   07/25/23 1200  vancomycin (VANCOREADY) IVPB 750 mg/150 mL        750 mg 150 mL/hr over 60 Minutes Intravenous Every 24 hours 07/24/23 1121     07/24/23 1015  cefTRIAXone (ROCEPHIN) 2 g in sodium chloride 0.9 % 100 mL IVPB        2 g 200 mL/hr over 30 Minutes Intravenous Every 24 hours 07/24/23 0918     07/24/23  1015  vancomycin (VANCOREADY) IVPB 1500 mg/300 mL        1,500 mg 150 mL/hr over 120 Minutes Intravenous  Once 07/24/23 0918         Objective: Vitals:   07/24/23 0355 07/24/23 1144  BP: (!) 112/52   Pulse: 92   Resp: 16   Temp: 99.8 F (37.7 C) 100 F (37.8 C)  SpO2: 94%     Intake/Output Summary (Last 24 hours) at 07/24/2023 1257 Last data filed at 07/24/2023 1139 Gross per 24 hour  Intake 2983.83 ml  Output 350 ml  Net 2633.83 ml   Filed Weights   07/23/23 0810 07/24/23 1144  Weight: 68.8 kg 70.9 kg   Weight change:  Body mass index is 26.83 kg/m.   Physical Exam: General exam: Pleasant, elderly Caucasian female. Skin: No rashes, lesions or ulcers. HEENT: Atraumatic, normocephalic, no obvious bleeding Lungs: Clear to  auscultation bilaterally,  CVS: S1, S2, no murmur,   GI/Abd: Soft, nontender, nondistended, bowel sound present,   CNS: Alert, awake, oriented x 3 Psychiatry: Mood appropriate,  Extremities: No pedal edema, no calf tenderness,   Data Review: I have personally reviewed the laboratory data and studies available.  F/u labs  Unresulted Labs (From admission, onward)     Start     Ordered   07/25/23 0500  Basic metabolic panel  Daily,   R      07/24/23 0857   07/25/23 0500  CBC with Differential/Platelet  Daily,   R      07/24/23 0857   07/25/23 0500  Iron and TIBC  Tomorrow morning,   R        07/24/23 0930   07/25/23 0500  Ferritin  Tomorrow morning,   R        07/24/23 0930   07/24/23 1135  MRSA Next Gen by PCR, Nasal  Once,   R        07/24/23 1135   07/23/23 1727  EPSTEIN-BARR VIRUS (EBV) Antibody Profile  ONCE - URGENT,   URGENT        07/23/23 1726   07/23/23 1719  Hemoglobin A1c  Add-on,   AD        07/23/23 1722            Total time spent in review of labs and imaging, patient evaluation, formulation of plan, documentation and communication with family: 55 minutes  Signed, Lorin Glass, MD [facility] 07/24/2023

## 2023-07-25 ENCOUNTER — Telehealth (INDEPENDENT_AMBULATORY_CARE_PROVIDER_SITE_OTHER): Payer: Self-pay | Admitting: Gastroenterology

## 2023-07-25 ENCOUNTER — Other Ambulatory Visit: Payer: Self-pay

## 2023-07-25 DIAGNOSIS — K921 Melena: Secondary | ICD-10-CM | POA: Diagnosis not present

## 2023-07-25 DIAGNOSIS — D649 Anemia, unspecified: Secondary | ICD-10-CM | POA: Diagnosis not present

## 2023-07-25 DIAGNOSIS — K922 Gastrointestinal hemorrhage, unspecified: Secondary | ICD-10-CM | POA: Diagnosis not present

## 2023-07-25 DIAGNOSIS — R509 Fever, unspecified: Secondary | ICD-10-CM | POA: Diagnosis not present

## 2023-07-25 DIAGNOSIS — R195 Other fecal abnormalities: Secondary | ICD-10-CM | POA: Diagnosis not present

## 2023-07-25 DIAGNOSIS — D61818 Other pancytopenia: Secondary | ICD-10-CM | POA: Diagnosis not present

## 2023-07-25 LAB — URINALYSIS, ROUTINE W REFLEX MICROSCOPIC
Bilirubin Urine: NEGATIVE
Glucose, UA: NEGATIVE mg/dL
Ketones, ur: NEGATIVE mg/dL
Nitrite: NEGATIVE
Protein, ur: 30 mg/dL — AB
Specific Gravity, Urine: 1.018 (ref 1.005–1.030)
pH: 5 (ref 5.0–8.0)

## 2023-07-25 LAB — CBC WITH DIFFERENTIAL/PLATELET
Abs Immature Granulocytes: 0.1 10*3/uL — ABNORMAL HIGH (ref 0.00–0.07)
Band Neutrophils: 13 %
Basophils Absolute: 0 10*3/uL (ref 0.0–0.1)
Basophils Relative: 0 %
Eosinophils Absolute: 0 10*3/uL (ref 0.0–0.5)
Eosinophils Relative: 0 %
HCT: 21.8 % — ABNORMAL LOW (ref 36.0–46.0)
Hemoglobin: 7 g/dL — ABNORMAL LOW (ref 12.0–15.0)
Lymphocytes Relative: 36 %
Lymphs Abs: 0.8 10*3/uL (ref 0.7–4.0)
MCH: 29.9 pg (ref 26.0–34.0)
MCHC: 32.1 g/dL (ref 30.0–36.0)
MCV: 93.2 fL (ref 80.0–100.0)
Metamyelocytes Relative: 3 %
Monocytes Absolute: 0.1 10*3/uL (ref 0.1–1.0)
Monocytes Relative: 4 %
Neutro Abs: 1.2 10*3/uL — ABNORMAL LOW (ref 1.7–7.7)
Neutrophils Relative %: 44 %
Platelets: 30 10*3/uL — ABNORMAL LOW (ref 150–400)
RBC: 2.34 MIL/uL — ABNORMAL LOW (ref 3.87–5.11)
RDW: 19 % — ABNORMAL HIGH (ref 11.5–15.5)
Smear Review: DECREASED
WBC: 2.1 10*3/uL — ABNORMAL LOW (ref 4.0–10.5)
nRBC: 0 % (ref 0.0–0.2)

## 2023-07-25 LAB — EPSTEIN-BARR VIRUS (EBV) ANTIBODY PROFILE
EBV NA IgG: <18 U/mL (ref 0.0–17.9)
EBV VCA IgG: 213 U/mL — ABNORMAL HIGH (ref 0.0–17.9)
EBV VCA IgM: <36 U/mL (ref 0.0–35.9)

## 2023-07-25 LAB — HEMOGLOBIN AND HEMATOCRIT, BLOOD
HCT: 23.4 % — ABNORMAL LOW (ref 36.0–46.0)
HCT: 27.4 % — ABNORMAL LOW (ref 36.0–46.0)
Hemoglobin: 7.4 g/dL — ABNORMAL LOW (ref 12.0–15.0)
Hemoglobin: 8.9 g/dL — ABNORMAL LOW (ref 12.0–15.0)

## 2023-07-25 LAB — BASIC METABOLIC PANEL
Anion gap: 9 (ref 5–15)
BUN: 11 mg/dL (ref 8–23)
CO2: 20 mmol/L — ABNORMAL LOW (ref 22–32)
Calcium: 7 mg/dL — ABNORMAL LOW (ref 8.9–10.3)
Chloride: 103 mmol/L (ref 98–111)
Creatinine, Ser: 1 mg/dL (ref 0.44–1.00)
GFR, Estimated: 55 mL/min — ABNORMAL LOW (ref >60)
Glucose, Bld: 102 mg/dL — ABNORMAL HIGH (ref 70–99)
Potassium: 3.8 mmol/L (ref 3.5–5.1)
Sodium: 132 mmol/L — ABNORMAL LOW (ref 135–145)

## 2023-07-25 LAB — HEMOGLOBIN A1C
Hgb A1c MFr Bld: 6.2 % — ABNORMAL HIGH (ref 4.8–5.6)
Mean Plasma Glucose: 131 mg/dL

## 2023-07-25 LAB — IRON AND TIBC
Iron: 19 ug/dL — ABNORMAL LOW (ref 28–170)
Saturation Ratios: 13 % (ref 10.4–31.8)
TIBC: 152 ug/dL — ABNORMAL LOW (ref 250–450)
UIBC: 133 ug/dL

## 2023-07-25 LAB — ABO/RH: ABO/RH(D): A POS

## 2023-07-25 LAB — PHOSPHORUS: Phosphorus: 1.2 mg/dL — ABNORMAL LOW (ref 2.5–4.6)

## 2023-07-25 LAB — PROCALCITONIN: Procalcitonin: 24.55 ng/mL

## 2023-07-25 LAB — FERRITIN: Ferritin: 704 ng/mL — ABNORMAL HIGH (ref 11–307)

## 2023-07-25 LAB — PREPARE RBC (CROSSMATCH)

## 2023-07-25 LAB — MRSA NEXT GEN BY PCR, NASAL: MRSA by PCR Next Gen: NOT DETECTED

## 2023-07-25 LAB — HAPTOGLOBIN: Haptoglobin: 300 mg/dL (ref 41–333)

## 2023-07-25 LAB — MAGNESIUM: Magnesium: 1.4 mg/dL — ABNORMAL LOW (ref 1.7–2.4)

## 2023-07-25 LAB — LACTIC ACID, PLASMA: Lactic Acid, Venous: 1.2 mmol/L (ref 0.5–1.9)

## 2023-07-25 MED ORDER — RISAQUAD PO CAPS
2.0000 | ORAL_CAPSULE | Freq: Three times a day (TID) | ORAL | Status: DC
  Administered 2023-07-25 – 2023-08-01 (×20): 2 via ORAL
  Filled 2023-07-25 (×19): qty 2

## 2023-07-25 MED ORDER — METRONIDAZOLE 500 MG/100ML IV SOLN
500.0000 mg | Freq: Two times a day (BID) | INTRAVENOUS | Status: DC
  Administered 2023-07-26 – 2023-07-28 (×6): 500 mg via INTRAVENOUS
  Filled 2023-07-25 (×6): qty 100

## 2023-07-25 MED ORDER — SODIUM CHLORIDE 0.9 % IV SOLN: 250.0000 mL | INTRAVENOUS | Status: AC

## 2023-07-25 MED ORDER — SODIUM CHLORIDE 0.9% IV SOLUTION: Freq: Once | INTRAVENOUS | Status: AC

## 2023-07-25 MED ORDER — CALCIUM CARBONATE ANTACID 500 MG PO CHEW
1.0000 | CHEWABLE_TABLET | Freq: Every day | ORAL | Status: DC
  Administered 2023-07-25 – 2023-08-01 (×8): 200 mg via ORAL
  Filled 2023-07-25 (×8): qty 1

## 2023-07-25 MED ORDER — SODIUM CHLORIDE 0.9 % IV SOLN: 2.0000 g | Freq: Two times a day (BID) | INTRAVENOUS | Status: DC

## 2023-07-25 MED ORDER — FUROSEMIDE 10 MG/ML IJ SOLN: 20.0000 mg | Freq: Once | INTRAMUSCULAR | Status: DC

## 2023-07-25 MED ORDER — HYDROCORTISONE SOD SUC (PF) 100 MG IJ SOLR
100.0000 mg | Freq: Three times a day (TID) | INTRAMUSCULAR | Status: DC
  Administered 2023-07-26 (×2): 100 mg via INTRAVENOUS
  Filled 2023-07-25 (×2): qty 2

## 2023-07-25 MED ORDER — METOPROLOL TARTRATE 5 MG/5ML IV SOLN
2.5000 mg | Freq: Once | INTRAVENOUS | Status: AC
  Administered 2023-07-25: 2.5 mg via INTRAVENOUS
  Filled 2023-07-25: qty 5

## 2023-07-25 MED ORDER — FUROSEMIDE 10 MG/ML IJ SOLN
20.0000 mg | Freq: Once | INTRAMUSCULAR | Status: AC
  Administered 2023-07-25: 20 mg via INTRAVENOUS
  Filled 2023-07-25: qty 2

## 2023-07-25 MED ORDER — IRON DEXTRAN 50 MG/ML IJ SOLN: 100.0000 mg | Freq: Once | INTRAMUSCULAR | Status: DC

## 2023-07-25 MED ORDER — SODIUM CHLORIDE 0.9 % IV BOLUS
250.0000 mL | Freq: Once | INTRAVENOUS | Status: AC
  Administered 2023-07-25: 250 mL via INTRAVENOUS

## 2023-07-25 MED ORDER — NOREPINEPHRINE 4 MG/250ML-% IV SOLN: 2.0000 ug/min | INTRAVENOUS | Status: DC

## 2023-07-25 MED ORDER — IBUPROFEN 400 MG PO TABS: 600.0000 mg | ORAL_TABLET | Freq: Three times a day (TID) | ORAL | Status: DC | PRN

## 2023-07-25 MED ORDER — MIDODRINE HCL 5 MG PO TABS
10.0000 mg | ORAL_TABLET | Freq: Three times a day (TID) | ORAL | Status: DC
  Administered 2023-07-25 – 2023-07-28 (×7): 10 mg via ORAL
  Filled 2023-07-25 (×7): qty 2

## 2023-07-25 MED ORDER — SODIUM CHLORIDE 0.9 % IV SOLN
2.0000 g | Freq: Two times a day (BID) | INTRAVENOUS | Status: DC
  Administered 2023-07-25 – 2023-07-29 (×8): 2 g via INTRAVENOUS
  Filled 2023-07-25 (×8): qty 12.5

## 2023-07-25 MED ORDER — ALUM & MAG HYDROXIDE-SIMETH 200-200-20 MG/5ML PO SUSP: 15.0000 mL | Freq: Four times a day (QID) | ORAL | Status: DC | PRN

## 2023-07-25 MED ORDER — POTASSIUM CHLORIDE 20 MEQ PO PACK
40.0000 meq | PACK | Freq: Once | ORAL | Status: AC
  Administered 2023-07-25: 40 meq via ORAL
  Filled 2023-07-25: qty 2

## 2023-07-25 MED ORDER — IRON SUCROSE 300 MG IVPB - SIMPLE MED
300.0000 mg | Freq: Once | Status: AC
  Administered 2023-07-25: 300 mg via INTRAVENOUS
  Filled 2023-07-25: qty 100

## 2023-07-25 MED ORDER — LACTATED RINGERS IV BOLUS: 1000.0000 mL | Freq: Once | INTRAVENOUS | Status: DC

## 2023-07-25 NOTE — Progress Notes (Addendum)
 Dr.Shahmehdi notified of pt.having a temp of 102.3 and that blood bank needs a type and screen.   MD asking for pt.to get ibuprofen. RN notified Dr.Shahmehdi no ibuprofen ordered and asking if provider wants her to get tylenol.  Also that pt's son in law would like to speak to him about blood ordered to be given.

## 2023-07-25 NOTE — Consult Note (Signed)
 [facility] Consultation Oncology  Name: Stephanie Wood      MRN: [id number]    Location: IC10/IC10-01  Date: 07/25/2023 Time:4:41 PM   REFERRING PHYSICIAN: Dr. Flossie Dibble  REASON FOR CONSULT: Pancytopenia   DIAGNOSIS: Pancytopenia of unclear etiology  HISTORY OF PRESENT ILLNESS: Stephanie Wood is a 89 year old female seen in consultation today at the request of Dr. Flossie Dibble for pancytopenia.  She lives at home and is able to take care of her ADLs.  She was seen on 07/10/2023 by GI as an outpatient with concern for black stools and poor appetite.  Elective EGD was planned for March.  Patient continued to have black stool leading to progressive weakness.  She felt dizzy on 07/23/2023 and had a fall and brought to the emergency room.  She had a fever of 101.2 with white count 1.3, hemoglobin 9.4 and platelet count 50.  ANC was 800.  Fecal occult blood was positive.  CT abdomen and pelvis with contrast showed normal spleen with no acute changes.  She was admitted to the hospital.  She is continuing to have fevers.  She is started on empiric antibiotics with Rocephin and vancomycin.  Blood cultures so far have been negative.  Hemoglobin dropped to 7 today.  She received 1 unit PRBC.  Labs including B12, folic acid were normal.  LDH was elevated at 242.  Haptoglobin was normal.  Ferritin was 704.  Percent saturation was 19.  Denied any recent tick bites or new medications.  She denies any smoking history or chemical exposure.  Brother had colon cancer and father had bone cancer.  PAST MEDICAL HISTORY:   Past Medical History:  Diagnosis Date   Acid reflux disease    Anemia    Anxiety    Colonic adenoma    Depression    Irritable bowel syndrome     ALLERGIES: Allergies  Allergen Reactions   Drug Class [Trazodone And Nefazodone]    Macrobid [Nitrofurantoin] Other (See Comments)    Elevated LFT's      MEDICATIONS: I have reviewed the patient's current medications.     PAST SURGICAL  HISTORY Past Surgical History:  Procedure Laterality Date   CATARACT EXTRACTION     COLONOSCOPY N/A 04/15/2014   Procedure: COLONOSCOPY;  Surgeon: Malissa Hippo, MD;  [facility];  Service: Endoscopy;  Laterality: N/A;  730   ESOPHAGOGASTRODUODENOSCOPY  09/20/2011   Procedure: ESOPHAGOGASTRODUODENOSCOPY (EGD);  Surgeon: Malissa Hippo, MD;  Location: [facility];  Service: Endoscopy;  Laterality: N/A;  215   ESOPHAGOGASTRODUODENOSCOPY (EGD) WITH PROPOFOL N/A 03/16/2022   Procedure: ESOPHAGOGASTRODUODENOSCOPY (EGD) WITH PROPOFOL;  Surgeon: Dolores Frame, MD;  Location: [facility];  Service: Gastroenterology;  Laterality: N/A;  1230 ASA 3   EYE SURGERY     bil catatract surgery    FAMILY HISTORY: Family History  Problem Relation Age of Onset   Prostate cancer Father     SOCIAL HISTORY:  reports that she has never smoked. She has never been exposed to tobacco smoke. She has never used smokeless tobacco. She reports that she does not drink alcohol and does not use drugs.  PERFORMANCE STATUS: The patient's performance status is 2 - Symptomatic, <50% confined to bed  PHYSICAL EXAM: Most Recent Vital Signs: Blood pressure 97/64, pulse (!) 101, temperature 98 F (36.7 C), temperature source Oral, resp. rate (!) 30, height 5\' 4"  (1.626 m), weight 156 lb 4.9 oz (70.9 kg), SpO2 92%. BP (!) 105/51  Pulse 99   Temp 98 F (36.7 C) (Oral)   Resp (!) 33   Ht 5\' 4"  (1.626 m)   Wt 156 lb 4.9 oz (70.9 kg)   SpO2 97%   BMI 26.83 kg/m  General appearance: alert, cooperative, and appears stated age Abdomen:  Nontender, no organomegaly Extremities:  No swelling of the extremities  LABORATORY DATA:  Results for orders placed or performed during the hospital encounter of 07/23/23 (from the past 48 hours)  Comprehensive metabolic panel     Status: Abnormal   Collection Time: 07/24/23  5:26 AM  Result Value Ref Range   Sodium 137 135 - 145 mmol/L   Potassium 3.2  (L) 3.5 - 5.1 mmol/L   Chloride 104 98 - 111 mmol/L   CO2 24 22 - 32 mmol/L   Glucose, Bld 101 (H) 70 - 99 mg/dL    Comment: Glucose reference range applies only to samples taken after fasting for at least 8 hours.   BUN 11 8 - 23 mg/dL   Creatinine, Ser 1.61 0.44 - 1.00 mg/dL   Calcium 7.9 (L) 8.9 - 10.3 mg/dL   Total Protein 6.0 (L) 6.5 - 8.1 g/dL   Albumin 2.5 (L) 3.5 - 5.0 g/dL   AST 31 15 - 41 U/L   ALT 16 0 - 44 U/L   Alkaline Phosphatase 81 38 - 126 U/L   Total Bilirubin 1.4 (H) 0.0 - 1.2 mg/dL   GFR, Estimated >09 >60 mL/min    Comment: (NOTE) Calculated using the CKD-EPI Creatinine Equation (2021)    Anion gap 9 5 - 15    Comment: Performed at [facility], [address] [place], [place] [address]  TSH     Status: None   Collection Time: 07/24/23  5:26 AM  Result Value Ref Range   TSH 0.473 0.350 - 4.500 uIU/mL    Comment: Performed by a 3rd Generation assay with a functional sensitivity of <=0.01 uIU/mL. Performed at [facility], [address] [place], [place] [address]   CBC with Differential/Platelet     Status: Abnormal   Collection Time: 07/24/23  5:26 AM  Result Value Ref Range   WBC 1.7 (L) 4.0 - 10.5 K/uL   RBC 2.51 (L) 3.87 - 5.11 MIL/uL   Hemoglobin 7.4 (L) 12.0 - 15.0 g/dL   HCT 47.8 (L) 29.5 - 62.1 %   MCV 93.2 80.0 - 100.0 fL   MCH 29.5 26.0 - 34.0 pg   MCHC 31.6 30.0 - 36.0 g/dL   RDW 30.8 (H) 65.7 - 84.6 %   Platelets 35 (L) 150 - 400 K/uL    Comment: SPECIMEN CHECKED FOR CLOTS Immature Platelet Fraction may be clinically indicated, consider ordering this additional test [id number] REPEATED TO VERIFY    nRBC 0.0 0.0 - 0.2 %   Neutrophils Relative % 55 %   Neutro Abs 1.0 (L) 1.7 - 7.7 K/uL   Lymphocytes Relative 34 %   Lymphs Abs 0.6 (L) 0.7 - 4.0 K/uL   Monocytes Relative 8 %   Monocytes Absolute 0.1 0.1 - 1.0 K/uL   Eosinophils Relative 1 %   Eosinophils Absolute 0.0 0.0 - 0.5 K/uL   Basophils Relative 0 %   Basophils Absolute 0.0 0.0  - 0.1 K/uL   WBC Morphology See Note     Comment: REACTIVE LYMPHOCYTES   RBC Morphology MORPHOLOGY UNREMARKABLE    Smear Review PLATELETS APPEAR DECREASED     Comment: PLATELET COUNT CONFIRMED BY SMEAR  Immature Granulocytes 2 %   Abs Immature Granulocytes 0.04 0.00 - 0.07 K/uL    Comment: Performed at [facility], [address] [place], [place] [address]  Vitamin B12     Status: None   Collection Time: 07/24/23  5:26 AM  Result Value Ref Range   Vitamin B-12 893 180 - 914 pg/mL    Comment: (NOTE) This assay is not validated for testing neonatal or myeloproliferative syndrome specimens for Vitamin B12 levels. Performed at [facility], [address], [place], [place] [address]   Folate     Status: None   Collection Time: 07/24/23  5:26 AM  Result Value Ref Range   Folate 14.4 >5.9 ng/mL    Comment: Performed at [facility], [address], [place], [place] [address]  Prealbumin     Status: Abnormal   Collection Time: 07/24/23  5:26 AM  Result Value Ref Range   Prealbumin <5 (L) 18 - 38 mg/dL    Comment: REPEATED TO VERIFY Performed at [facility], [address] [place], [place] [address]   Technologist smear review     Status: None   Collection Time: 07/24/23  9:26 AM  Result Value Ref Range   WBC MORPHOLOGY FEW REACTIVE LYMPHOCYTES     Comment: TOXIC GRANULATION   RBC MORPHOLOGY MORPHOLOGY UNREMARKABLE    Plt Morphology PLATELETS APPEAR DECREASED    Clinical Information CBC semar for pancytopenia     Comment: Performed at [facility], [address], [place], [place] [address]  Lactate dehydrogenase     Status: Abnormal   Collection Time: 07/24/23  9:26 AM  Result Value Ref Range   LDH 242 (H) 98 - 192 U/L    Comment: Performed at [facility], [address] [place], [place] [address]  Haptoglobin     Status: None   Collection Time: 07/24/23  9:26 AM  Result Value Ref Range   Haptoglobin 300 41 - 333 mg/dL    Comment: (NOTE) Performed At: [facility] [address] [place], [place] [id number] Jolene Schimke MD [id number]   Culture, blood (Routine X 2) w Reflex to ID Panel     Status: None (Preliminary result)   Collection Time: 07/24/23  9:27 AM   Specimen: BLOOD  Result Value Ref Range   Specimen Description BLOOD BLOOD LEFT HAND    Special Requests      BOTTLES DRAWN AEROBIC AND ANAEROBIC Blood Culture adequate volume   Culture      NO GROWTH < 24 HOURS Performed at [facility], [address], [place], [place] [address]    Report Status PENDING   Culture, blood (Routine X 2) w Reflex to ID Panel     Status: None (Preliminary result)   Collection Time: 07/24/23  9:27 AM   Specimen: BLOOD  Result Value Ref Range   Specimen Description BLOOD BLOOD RIGHT HAND    Special Requests      BOTTLES DRAWN AEROBIC AND ANAEROBIC Blood Culture adequate volume   Culture      NO GROWTH < 24 HOURS Performed at [facility], [address], [place], [place] [address]    Report Status PENDING   Lactic acid, plasma     Status: None   Collection Time: 07/24/23  9:27 AM  Result Value Ref Range   Lactic Acid, Venous 1.1 0.5 - 1.9 mmol/L    Comment: Performed at [facility], [address] [place], [place] [address]  Bilirubin, fractionated(tot/dir/indir)  Status: Abnormal   Collection Time: 07/24/23  9:27 AM  Result Value Ref Range   Total Bilirubin 1.2 0.0 - 1.2 mg/dL   Bilirubin, Direct 0.3 (H) 0.0 - 0.2 mg/dL   Indirect Bilirubin 0.9 0.3 - 0.9 mg/dL    Comment: Performed at [facility], [address] [place], [place] [address]  MRSA Next Gen by PCR, Nasal     Status: None   Collection Time: 07/24/23 11:45 AM   Specimen: Nasal Mucosa; Nasal Swab  Result Value Ref Range   MRSA by PCR Next Gen NOT DETECTED NOT DETECTED    Comment: (NOTE) The GeneXpert MRSA Assay (FDA approved for NASAL specimens only), is one component of a comprehensive MRSA colonization surveillance program. It is not intended to diagnose MRSA  infection nor to guide or monitor treatment for MRSA infections. Test performance is not FDA approved in patients less than 67 years old. Performed at [facility], [address] [place], [place] [address]   ABO/Rh     Status: None   Collection Time: 07/25/23  5:08 AM  Result Value Ref Range   ABO/RH(D)      A POS Performed at [facility], [address] [place], [place] [address]   Basic metabolic panel     Status: Abnormal   Collection Time: 07/25/23  5:10 AM  Result Value Ref Range   Sodium 132 (L) 135 - 145 mmol/L   Potassium 3.8 3.5 - 5.1 mmol/L   Chloride 103 98 - 111 mmol/L   CO2 20 (L) 22 - 32 mmol/L   Glucose, Bld 102 (H) 70 - 99 mg/dL    Comment: Glucose reference range applies only to samples taken after fasting for at least 8 hours.   BUN 11 8 - 23 mg/dL   Creatinine, Ser 9.14 0.44 - 1.00 mg/dL   Calcium 7.0 (L) 8.9 - 10.3 mg/dL   GFR, Estimated 55 (L) >60 mL/min    Comment: (NOTE) Calculated using the CKD-EPI Creatinine Equation (2021)    Anion gap 9 5 - 15    Comment: Performed at [facility], [address] [place], [place] [address]  CBC with Differential/Platelet     Status: Abnormal   Collection Time: 07/25/23  5:10 AM  Result Value Ref Range   WBC 2.1 (L) 4.0 - 10.5 K/uL   RBC 2.34 (L) 3.87 - 5.11 MIL/uL   Hemoglobin 7.0 (L) 12.0 - 15.0 g/dL   HCT 62.1 (L) 30.8 - 65.7 %   MCV 93.2 80.0 - 100.0 fL   MCH 29.9 26.0 - 34.0 pg   MCHC 32.1 30.0 - 36.0 g/dL   RDW 84.6 (H) 96.2 - 95.2 %   Platelets 30 (L) 150 - 400 K/uL    Comment: SPECIMEN CHECKED FOR CLOTS Immature Platelet Fraction may be clinically indicated, consider ordering this additional test [id number] REPEATED TO VERIFY    nRBC 0.0 0.0 - 0.2 %   Neutrophils Relative % 44 %   Neutro Abs 1.2 (L) 1.7 - 7.7 K/uL   Band Neutrophils 13 %   Lymphocytes Relative 36 %   Lymphs Abs 0.8 0.7 - 4.0 K/uL   Monocytes Relative 4 %   Monocytes Absolute 0.1 0.1 - 1.0 K/uL   Eosinophils Relative 0 %    Eosinophils Absolute 0.0 0.0 - 0.5 K/uL   Basophils Relative 0 %   Basophils Absolute 0.0 0.0 - 0.1 K/uL   RBC Morphology MORPHOLOGY UNREMARKABLE    Smear Review PLATELETS APPEAR DECREASED  Comment: PLATELET COUNT CONFIRMED BY SMEAR   Metamyelocytes Relative 3 %   Abs Immature Granulocytes 0.10 (H) 0.00 - 0.07 K/uL    Comment: Performed at [facility], [address] [place], [place] [address]  Iron and TIBC     Status: Abnormal   Collection Time: 07/25/23  5:10 AM  Result Value Ref Range   Iron 19 (L) 28 - 170 ug/dL   TIBC 621 (L) 308 - 657 ug/dL   Saturation Ratios 13 10.4 - 31.8 %   UIBC 133 ug/dL    Comment: Performed at [facility], [address], [place], [place] [address]  Ferritin     Status: Abnormal   Collection Time: 07/25/23  5:10 AM  Result Value Ref Range   Ferritin 704 (H) 11 - 307 ng/mL    Comment: Performed at [facility], [address], [place], [place] [address]  Prepare RBC (crossmatch)     Status: None   Collection Time: 07/25/23 11:44 AM  Result Value Ref Range   Order Confirmation      ORDER PROCESSED BY BLOOD BANK Performed at [facility], [address], [place], [place] [address]   Hemoglobin and hematocrit, blood     Status: Abnormal   Collection Time: 07/25/23 11:44 AM  Result Value Ref Range   Hemoglobin 7.4 (L) 12.0 - 15.0 g/dL   HCT 40.1 (L) 02.7 - 25.3 %    Comment: Performed at [facility], [address], [place], [place] [address]  Type and screen [facility]     Status: None (Preliminary result)   Collection Time: 07/25/23 11:44 AM  Result Value Ref Range   ABO/RH(D) A POS    Antibody Screen NEG    Sample Expiration 07/28/2023,2359    Unit Number [id number]    Blood Component Type RED CELLS,LR    Unit division 00    Status of Unit DISCARDED    Transfusion Status OK TO TRANSFUSE    Crossmatch Result Compatible    Unit Number [id number]    Blood Component Type RED CELLS,LR    Unit division 00    Status of Unit  ISSUED    Transfusion Status OK TO TRANSFUSE    Crossmatch Result      Compatible Performed at [facility], [address] [place], [place] [address]   Procalcitonin     Status: None   Collection Time: 07/25/23 12:31 PM  Result Value Ref Range   Procalcitonin 24.55 ng/mL    Comment:        Interpretation: PCT >= 10 ng/mL: Important systemic inflammatory response, almost exclusively due to severe bacterial sepsis or septic shock. (NOTE)       Sepsis PCT Algorithm           Lower Respiratory Tract                                      Infection PCT Algorithm    ----------------------------     ----------------------------         PCT < 0.25 ng/mL                PCT < 0.10 ng/mL          Strongly encourage             Strongly discourage   discontinuation of antibiotics    initiation of antibiotics    ----------------------------     -----------------------------  PCT 0.25 - 0.50 ng/mL            PCT 0.10 - 0.25 ng/mL               OR       >80% decrease in PCT            Discourage initiation of                                            antibiotics      Encourage discontinuation           of antibiotics    ----------------------------     -----------------------------         PCT >= 0.50 ng/mL              PCT 0.26 - 0.50 ng/mL                AND       <80% decrease in PCT             Encourage initiation of                                             antibiotics       Encourage continuation           of antibiotics    ----------------------------     -----------------------------        PCT >= 0.50 ng/mL                  PCT > 0.50 ng/mL               AND         increase in PCT                  Strongly encourage                                      initiation of antibiotics    Strongly encourage escalation           of antibiotics                                     -----------------------------                                           PCT <= 0.25 ng/mL                                                  OR                                        > 80% decrease in PCT  Discontinue / Do not initiate                                             antibiotics  Performed at [facility], [address], [place], [place] [address]   Lactic acid, plasma     Status: None   Collection Time: 07/25/23 12:31 PM  Result Value Ref Range   Lactic Acid, Venous 1.2 0.5 - 1.9 mmol/L    Comment: Performed at [facility], [address], [place], [place] [address]  Magnesium     Status: Abnormal   Collection Time: 07/25/23 12:31 PM  Result Value Ref Range   Magnesium 1.4 (L) 1.7 - 2.4 mg/dL    Comment: Performed at [facility], [address] [place], [place] [address]  Phosphorus     Status: Abnormal   Collection Time: 07/25/23 12:31 PM  Result Value Ref Range   Phosphorus 1.2 (L) 2.5 - 4.6 mg/dL    Comment: Performed at [facility], [address] [place], [place] [address]  Urinalysis, Routine w reflex microscopic -Urine, Clean Catch     Status: Abnormal   Collection Time: 07/25/23 12:50 PM  Result Value Ref Range   Color, Urine YELLOW YELLOW   APPearance HAZY (A) CLEAR   Specific Gravity, Urine 1.018 1.005 - 1.030   pH 5.0 5.0 - 8.0   Glucose, UA NEGATIVE NEGATIVE mg/dL   Hgb urine dipstick MODERATE (A) NEGATIVE   Bilirubin Urine NEGATIVE NEGATIVE   Ketones, ur NEGATIVE NEGATIVE mg/dL   Protein, ur 30 (A) NEGATIVE mg/dL   Nitrite NEGATIVE NEGATIVE   Leukocytes,Ua MODERATE (A) NEGATIVE   RBC / HPF 6-10 0 - 5 RBC/hpf   WBC, UA 21-50 0 - 5 WBC/hpf   Bacteria, UA RARE (A) NONE SEEN   Squamous Epithelial / HPF 0-5 0 - 5 /HPF   Mucus PRESENT     Comment: Performed at [facility], [address] [place], [place] [address]      RADIOGRAPHY: [place] EKG SITE RITE Result Date: 07/25/2023 If [facility] image not attached, placement could not be confirmed due to current cardiac rhythm.  Portable chest 1 View Result Date:  07/24/2023 CLINICAL DATA:  Fever. EXAM: PORTABLE CHEST 1 VIEW COMPARISON:  Radiographs 07/22/2022 and 08/23/2013. Abdominal CT 07/23/2023 FINDINGS: 0650 hours. Persistent low lung volumes. The heart size and mediastinal contours are stable with a moderate size hiatal hernia. No significant change in vascular congestion and bibasilar atelectasis from yesterday. No confluent airspace disease, pneumothorax or acute osseous abnormality identified. IMPRESSION: No significant change in vascular congestion and bibasilar atelectasis from yesterday. No evidence of pneumonia. Electronically Signed   By: Carey Bullocks M.D.   On: 07/24/2023 10:54        ASSESSMENT and PLAN:  1.  Pancytopenia: - She had normal white count in September 2024.  She had mild low platelet count of 114 at the same time.  Her hemoglobin was normal and September 2024. - Differential diagnosis includes sepsis and bone marrow failure syndrome including MDS/AML. - Nutritional deficiencies were ruled out.  LDH was elevated at 242. - Recommend bone marrow aspiration and biopsy.  Discussed with Dr.Dahal. - I discussed the need for bone marrow biopsy with the patient and her niece.  She was evaluated by IR and is being planned for bone marrow biopsy on Friday. - Will request chromosome  analysis and MDS/AML FISH panel.   All questions were answered. The patient knows to call the clinic with any problems, questions or concerns. We can certainly see the patient much sooner if necessary.   Doreatha Massed

## 2023-07-25 NOTE — Progress Notes (Signed)
 OT Cancellation Note  Patient Details Name: Stephanie Wood MRN: [id number] DOB: February 22, 1935   Cancelled Treatment:    Reason Eval/Treat Not Completed: Fatigue/lethargy limiting ability to participate. Attempted to see pt this am, son-in-law present. Pt SOB at rest, SpO2 maintaining at 92-96, however pt becoming very fatigued with simple conversation with difficulty remaining alert. Per son-in-law pt was independent and driving up until 3 weeks ago, has been complaining of back pain since most recent fall.   Will check back on pt and evaluate when pt able to remain alert and participate.    Ezra Sites, OTR/L  [phone] 07/25/2023, 10:01 AM

## 2023-07-25 NOTE — Progress Notes (Signed)
 PROGRESS NOTE    Patient: Stephanie Wood                            PCP: Billie Lade, MD                    DOB: 1934-07-12            DOA: 07/23/2023 ZOX:[id number]             DOS: 07/25/2023, 10:55 AM   LOS: 2 days   Date of Service: The patient was seen and examined on 07/25/2023  Subjective:   The patient was seen and examined this morning. BP soft but stable, tachycardic rate of 118, tachypneic with RR 31, Tmax last 24 hours 100.4,--currently 98.1 No issues overnight .  Brief Narrative:   CHAYLA SHANDS is a 89 y.o. 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 Patient lives at home and able to take care of her ADLs. 2/11, seen by GI as an outpatient with concern of black stools, poor appetite. Elective EGD was planned for March. Patient continued to have black stool leading to progressive weakness.   2/24, patient felt dizzy, unsteady and had a fall and hence brought to the ED by EMS from home.   ED:  Tmax. 101.2, heart rate in 90s, blood pressure 128/58, breathing on 2 L oxygen by nasal cannula. Labs with WBC count 1.3, hemoglobin 9.4, platelets 50, sodium 134, potassium 3, magnesium 1.4, phosphorus 1.5. Urinalysis unremarkable Fecal occult blood positive CT abdomen pelvis with contrast showed chronic moderate to large gastric hiatal hernia, spleen normal CT head showed mild chronic small vessel ischemic changes, generalized parenchymal atrophy. GI was consulted   Assessment and plan:  New onset anemia Heme positive stool Presented with progressive weakness, poor appetite, black stool and a drop in hemoglobin. Baseline hemoglobin 12.3 from September 2024, hemoglobin on admission 9.4.  Also has low WBC count and low platelet count FOBT positive but no overtly bleeding. GI consulted.  EGD/colonoscopy -pending due to thrombocytopenia Continue IV Protonix 40  mg twice daily    Latest Ref Rng & Units 07/25/2023    5:10 AM 07/24/2023    5:26 AM 07/23/2023    8:42 AM  CBC  WBC 4.0 - 10.5 K/uL 2.1  1.7  1.3   Hemoglobin 12.0 - 15.0 g/dL 7.0  7.4  9.4   Hematocrit 36.0 - 46.0 % 21.8  23.4  29.4   Platelets 150 - 400 K/uL 30  35  50     As patient remains symptomatic, for pursuing with 2U PRBC blood transfusion Discussed with patient and her son-in-law at bedside agree with pursuing transfusion   Pancytopenia All cell lines are depressed. Last blood work from September 2024 with normal WBC count, hemoglobin and platelet only mildly low at 117.   Unclear etiology -sepsis related versus possible underlying malignancy Blood culture sent.  If it is infection related, counts should improve gradually.  If workup negative for infection, primary malignancy has to be considered.   Discussed with oncologist Dr. Ellin Saba. Patient is to follow-up as an outpatient    Iron/TIBC/Ferritin/ %Sat    Component Value Date/Time   IRON 19 (L) 07/25/2023 0510   TIBC 152 (L) 07/25/2023 0510   FERRITIN 704 (H) 07/25/2023 0510   IRONPCTSAT 13 07/25/2023 0510  Folate 14.4, B12 893,  Initiating oral and IM/ IV Iron supplements   Sepsis - POA Multiple fever spikes in last 24 hours with leukocytopenia Infectious etiology versus underlying malignancy. Blood cultures>> no growth to date -Lactic acid 1.1 Start empiric antibiotic with IV Rocephin and IV vancomycin   Hypokalemia/hypomagnesemia/hypophosphatemia Oral and IV replacement given.   IBS-D At home on Bentyl, Imodium   Lower extremity edema Echo from 2023 showing EF 60% and Grade 1 Diastolic dysfunction. No evidence of fluid overload.  At home on Lasix 20 g daily Currently on hold   Chronic UTI No active complaints. UA relatively unremarkable.  Hold home Methenamine   Anxiety continue home Xanax PRN.    HLD Statin consulted  Severe debility with falls -Once stable consulting PT OT for  evaluation likely will need home health versus placement    Assessment & Plan:   Principal Problem:   GI bleed Active Problems:   Osteoporosis   Gastroesophageal reflux disease   Anxiety state   Pancytopenia (HCC)   Fecal occult blood test positive   Recurrent UTI   Fever, unknown origin   Hyperglycemia     Assessment and Plan: No notes have been filed under this hospital service. Service: Hospitalist           ----------------------------------------------------------------------------------------------------------------------------------------------- Nutritional status:  The patient's BMI is: Body mass index is 26.83 kg/m. I agree with the assessment and plan as outlined  --------------------------------------------------------------------------------------------------------------------------- Cultures; Blood Cultures x 2 >> NGT Urine Culture  >>> NGT  Sputum Culture >> NGT    ------------------------------------------------------------------------------------------------------------------------------------------------  DVT prophylaxis:  SCDs Start: 07/23/23 1717   Code Status:   Code Status: Limited: Do not attempt resuscitation (DNR) -DNR-LIMITED -Do Not Intubate/DNI   Family Communication: Son-in-law present at bedside-updated -Advance care planning has been discussed.   Admission status:   Status is: Inpatient Remains inpatient appropriate because: Needing close monitoring in the stepdown/ICU setting, for pancytopenia, sepsis, needing antibiotics   Disposition: From  - home             Planning for discharge in likely Friday 2/28 home with home health versus SNF  Procedures:   No admission procedures for hospital encounter.   Antimicrobials:  Anti-infectives (From admission, onward)    Start     Dose/Rate Route Frequency Ordered Stop   07/25/23 1200  vancomycin (VANCOREADY) IVPB 750 mg/150 mL        750 mg 150 mL/hr over 60 Minutes  Intravenous Every 24 hours 07/24/23 1121     07/24/23 1015  cefTRIAXone (ROCEPHIN) 2 g in sodium chloride 0.9 % 100 mL IVPB        2 g 200 mL/hr over 30 Minutes Intravenous Every 24 hours 07/24/23 0918     07/24/23 1015  vancomycin (VANCOREADY) IVPB 1500 mg/300 mL        1,500 mg 150 mL/hr over 120 Minutes Intravenous  Once 07/24/23 0918 07/24/23 1305        Medication:   sodium chloride   Intravenous Once   ALPRAZolam  0.5 mg Oral QHS   atorvastatin  20 mg Oral Daily   Chlorhexidine Gluconate Cloth  6 each Topical Daily   furosemide  20 mg Intravenous Once   furosemide  20 mg Intravenous Once   iron dextran complex  100 mg Intramuscular Once   pantoprazole (PROTONIX) IV  40 mg Intravenous Q12H   pneumococcal 20-valent conjugate vaccine  0.5 mL Intramuscular Tomorrow-1000    acetaminophen **OR** acetaminophen, dicyclomine, loperamide, methocarbamol (ROBAXIN) injection, ondansetron **OR** ondansetron (  ZOFRAN) IV, mouth rinse   Objective:   Vitals:   07/25/23 0748 07/25/23 0800 07/25/23 0900 07/25/23 1000  BP:  (!) 140/113 120/73 118/72  Pulse:  (!) 103 (!) 106 (!) 118  Resp:  (!) 36 (!) 24 (!) 31  Temp: 98.1 F (36.7 C)     TempSrc: Oral     SpO2:  97% 92% 95%  Weight:      Height:        Intake/Output Summary (Last 24 hours) at 07/25/2023 1055 Last data filed at 07/25/2023 0359 Gross per 24 hour  Intake 3877.06 ml  Output 475 ml  Net 3402.06 ml   Filed Weights   07/23/23 0810 07/24/23 1144  Weight: 68.8 kg 70.9 kg     Physical examination:        General:  AAO x 3,  cooperative, no distress;   HEENT:  Normocephalic, PERRL, otherwise with in Normal limits   Neuro:  CNII-XII intact. , normal motor and sensation, reflexes intact   Lungs:   Clear to auscultation BL, Respirations unlabored,  No wheezes / crackles  Cardio:    S1/S2, RRR, No murmure, No Rubs or Gallops   Abdomen:  Soft, non-tender, bowel sounds active all four quadrants, no guarding or  peritoneal signs.  Muscular  skeletal:  Limited exam -global generalized weaknesses - in bed, able to move all 4 extremities,   2+ pulses,  symmetric, No pitting edema  Skin:  Dry, warm to touch, negative for any Rashes,  Wounds: Please see nursing documentation          ------------------------------------------------------------------------------------------------------------------------------------------    LABs:     Latest Ref Rng & Units 07/25/2023    5:10 AM 07/24/2023    5:26 AM 07/23/2023    8:42 AM  CBC  WBC 4.0 - 10.5 K/uL 2.1  1.7  1.3   Hemoglobin 12.0 - 15.0 g/dL 7.0  7.4  9.4   Hematocrit 36.0 - 46.0 % 21.8  23.4  29.4   Platelets 150 - 400 K/uL 30  35  50       Latest Ref Rng & Units 07/25/2023    5:10 AM 07/24/2023    9:27 AM 07/24/2023    5:26 AM  CMP  Glucose 70 - 99 mg/dL 161   096   BUN 8 - 23 mg/dL 11   11   Creatinine 0.45 - 1.00 mg/dL 4.09   8.11   Sodium 914 - 145 mmol/L 132   137   Potassium 3.5 - 5.1 mmol/L 3.8   3.2   Chloride 98 - 111 mmol/L 103   104   CO2 22 - 32 mmol/L 20   24   Calcium 8.9 - 10.3 mg/dL 7.0   7.9   Total Protein 6.5 - 8.1 g/dL   6.0   Total Bilirubin 0.0 - 1.2 mg/dL  1.2  1.4   Alkaline Phos 38 - 126 U/L   81   AST 15 - 41 U/L   31   ALT 0 - 44 U/L   16        Micro Results Recent Results (from the past 240 hours)  Resp panel by RT-PCR (RSV, Flu A&B, Covid) Anterior Nasal Swab     Status: None   Collection Time: 07/23/23 12:02 PM   Specimen: Anterior Nasal Swab  Result Value Ref Range Status   SARS Coronavirus 2 by RT PCR NEGATIVE NEGATIVE Final    Comment: (NOTE) SARS-CoV-2 target nucleic acids  are NOT DETECTED.  The SARS-CoV-2 RNA is generally detectable in upper respiratory specimens during the acute phase of infection. The lowest concentration of SARS-CoV-2 viral copies this assay can detect is 138 copies/mL. A negative result does not preclude SARS-Cov-2 infection and should not be used as the sole basis  for treatment or other patient management decisions. A negative result may occur with  improper specimen collection/handling, submission of specimen other than nasopharyngeal swab, presence of viral mutation(s) within the areas targeted by this assay, and inadequate number of viral copies(<138 copies/mL). A negative result must be combined with clinical observations, patient history, and epidemiological information. The expected result is Negative.  Fact Sheet for Patients:  BloggerCourse.com  Fact Sheet for Healthcare Providers:  SeriousBroker.it  This test is no t yet approved or cleared by the [place] FDA and  has been authorized for detection and/or diagnosis of SARS-CoV-2 by FDA under an Emergency Use Authorization (EUA). This EUA will remain  in effect (meaning this test can be used) for the duration of the COVID-19 declaration under Section 564(b)(1) of the Act, 21 U.S.C.section 360bbb-3(b)(1), unless the authorization is terminated  or revoked sooner.       Influenza A by PCR NEGATIVE NEGATIVE Final   Influenza B by PCR NEGATIVE NEGATIVE Final    Comment: (NOTE) The Xpert Xpress SARS-CoV-2/FLU/RSV plus assay is intended as an aid in the diagnosis of influenza from Nasopharyngeal swab specimens and should not be used as a sole basis for treatment. Nasal washings and aspirates are unacceptable for Xpert Xpress SARS-CoV-2/FLU/RSV testing.  Fact Sheet for Patients: BloggerCourse.com  Fact Sheet for Healthcare Providers: SeriousBroker.it  This test is not yet approved or cleared by the [place] FDA and has been authorized for detection and/or diagnosis of SARS-CoV-2 by FDA under an Emergency Use Authorization (EUA). This EUA will remain in effect (meaning this test can be used) for the duration of the COVID-19 declaration under Section 564(b)(1) of the Act, 21  U.S.C. section 360bbb-3(b)(1), unless the authorization is terminated or revoked.     Resp Syncytial Virus by PCR NEGATIVE NEGATIVE Final    Comment: (NOTE) Fact Sheet for Patients: BloggerCourse.com  Fact Sheet for Healthcare Providers: SeriousBroker.it  This test is not yet approved or cleared by the [place] FDA and has been authorized for detection and/or diagnosis of SARS-CoV-2 by FDA under an Emergency Use Authorization (EUA). This EUA will remain in effect (meaning this test can be used) for the duration of the COVID-19 declaration under Section 564(b)(1) of the Act, 21 U.S.C. section 360bbb-3(b)(1), unless the authorization is terminated or revoked.  Performed at [facility], [address] [place], [place] [address]   Culture, blood (Routine X 2) w Reflex to ID Panel     Status: None (Preliminary result)   Collection Time: 07/24/23  9:27 AM   Specimen: BLOOD  Result Value Ref Range Status   Specimen Description BLOOD BLOOD LEFT HAND  Final   Special Requests   Final    BOTTLES DRAWN AEROBIC AND ANAEROBIC Blood Culture adequate volume   Culture   Final    NO GROWTH < 24 HOURS Performed at [facility], [address] [place], [place] [address]    Report Status PENDING  Incomplete  Culture, blood (Routine X 2) w Reflex to ID Panel     Status: None (Preliminary result)   Collection Time: 07/24/23  9:27 AM   Specimen: BLOOD  Result Value Ref Range Status   Specimen  Description BLOOD BLOOD RIGHT HAND  Final   Special Requests   Final    BOTTLES DRAWN AEROBIC AND ANAEROBIC Blood Culture adequate volume   Culture   Final    NO GROWTH < 24 HOURS Performed at [facility], [address] [place], [place] [address]    Report Status PENDING  Incomplete  MRSA Next Gen by PCR, Nasal     Status: None   Collection Time: 07/24/23 11:45 AM   Specimen: Nasal Mucosa; Nasal Swab  Result Value Ref Range Status   MRSA by PCR  Next Gen NOT DETECTED NOT DETECTED Final    Comment: (NOTE) The GeneXpert MRSA Assay (FDA approved for NASAL specimens only), is one component of a comprehensive MRSA colonization surveillance program. It is not intended to diagnose MRSA infection nor to guide or monitor treatment for MRSA infections. Test performance is not FDA approved in patients less than 37 years old. Performed at [facility], [address] [place], [place] [address]     Radiology Reports No results found.  SIGNED: Kendell Bane, MD, FHM. FAAFP. [facility] Critical care  time spent 55 minutes.  In seeing, evaluating and examining the patient. Reviewing medical records, labs, drawn plan of care. [facility],  Pager (please use [email or web address] to page/ text) Please use Epic Secure Chat for non-urgent communication (7AM-7PM)  If 7PM-7AM, please contact night-coverage [email or web address], 07/25/2023, 10:55 AM

## 2023-07-25 NOTE — Progress Notes (Signed)
 Subjective: Patient doing okay today. States she has a little nausea, some lower abdominal pain. Reports ongoing diarrhea but no further melena and no BRBPR. She denies any SOB though appears somewhat winded during our encounter.    Objective: Vital signs in last 24 hours: Temp:  [97.9 F (36.6 C)-100.4 F (38 C)] 98.1 F (36.7 C) (02/26 0748) Pulse Rate:  [84-118] 118 (02/26 1000) Resp:  [18-45] 31 (02/26 1000) BP: (86-140)/(38-117) 118/72 (02/26 1000) SpO2:  [86 %-100 %] 95 % (02/26 1000) Weight:  [70.9 kg] 70.9 kg (02/25 1144) Last BM Date : 07/24/23 General:   Alert and oriented, pleasant Head:  Normocephalic and atraumatic. Eyes:  No icterus, sclera clear. Conjuctiva pink.  Mouth:  Without lesions, mucosa pink and moist.  Heart:  S1, S2 present, no murmurs noted.  Lungs: Clear to auscultation bilaterally, without wheezing, rales, or rhonchi.  Abdomen:  Bowel sounds present, soft, non-tender, non-distended. No HSM or hernias noted. No rebound or guarding. No masses appreciated  Neurologic:  Alert and  oriented x4;  grossly normal neurologically. Psych:  Alert and cooperative. Normal mood and affect.  Intake/Output from previous day: 02/25 0701 - 02/26 0700 In: 3877.1 [P.O.:480; I.V.:2643.8; IV Piggyback:753.3] Out: 475 [Urine:475] Intake/Output this shift: No intake/output data recorded.  Lab Results: Recent Labs    07/23/23 0842 07/24/23 0526 07/25/23 0510  WBC 1.3* 1.7* 2.1*  HGB 9.4* 7.4* 7.0*  HCT 29.4* 23.4* 21.8*  PLT 50* 35* 30*   BMET Recent Labs    07/23/23 0842 07/24/23 0526 07/25/23 0510  NA 134* 137 132*  K 3.0* 3.2* 3.8  CL 96* 104 103  CO2 26 24 20*  GLUCOSE 129* 101* 102*  BUN 12 11 11   CREATININE 0.93 0.89 1.00  CALCIUM 9.0 7.9* 7.0*   LFT Recent Labs    07/23/23 0842 07/24/23 0526 07/24/23 0927  PROT 7.5 6.0*  --   ALBUMIN 3.2* 2.5*  --   AST 25 31  --   ALT 19 16  --   ALKPHOS 95 81  --   BILITOT 1.1 1.4* 1.2  BILIDIR  --    --  0.3*  IBILI  --   --  0.9    Studies/Results: Portable chest 1 View Result Date: 07/24/2023 CLINICAL DATA:  Fever. EXAM: PORTABLE CHEST 1 VIEW COMPARISON:  Radiographs 07/22/2022 and 08/23/2013. Abdominal CT 07/23/2023 FINDINGS: 0650 hours. Persistent low lung volumes. The heart size and mediastinal contours are stable with a moderate size hiatal hernia. No significant change in vascular congestion and bibasilar atelectasis from yesterday. No confluent airspace disease, pneumothorax or acute osseous abnormality identified. IMPRESSION: No significant change in vascular congestion and bibasilar atelectasis from yesterday. No evidence of pneumonia. Electronically Signed   By: Carey Bullocks M.D.   On: 07/24/2023 10:54   DG Chest Portable 1 View Result Date: 07/23/2023 CLINICAL DATA:  Cough.  Dizziness with fall. EXAM: PORTABLE CHEST 1 VIEW COMPARISON:  Radiographs 08/23/2013 and 07/28/2013. Abdominal CT 07/23/2023. FINDINGS: 1153 hours. Low lung volumes. The heart size and mediastinal contours are stable with a moderate size hiatal hernia. There is vascular congestion with mildly increased atelectasis at both lung bases. No confluent airspace disease, pleural effusion or pneumothorax. No acute fractures are demonstrated. IMPRESSION: Low lung volumes with vascular congestion and mildly increased atelectasis at both lung bases. No evidence of pneumonia or pulmonary edema. Electronically Signed   By: Carey Bullocks M.D.   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  GI consulted due to anemia.  New onset anemia/melena: Hemoglobin 9.4 admission, down from 12.3 September 24 but also with pancytopenia,   No B12 or folate deficiency.  Iron was 19, TIBC 06/29/1948, saturation 13, ferritin 704.    Patient reports of black stools several weeks ago with last episode 2/11, heme positive as outpatient, no overt GI bleeding since admission.  Denies NSAIDs.  Likely patient has experienced recent upper GI bleed with differentials including Sheria Lang lesions due to large hiatal hernia, PUD, AVMs etc. versus bleeding from right side colonic lesion contributing to declining hemoglobin that this would explain patient's pancytopenia.  There certainly concern for underlying hematologic process versus infection in light of fever of unknown origin as well.  In light of melena and heme positive stool as well as iron deficiency, will need to consider EGD colonoscopy once optimized medically/platelet count improved.  Fever of unknown origin/pancytopenia: Tmax 102.9. WBC today is 2.1 up from 1.3 on admission, platelet count 30,000, hemoglobin 7 today.  CT A/P without acute abnormalities/normal spleen.  No pneumonia or pulmonary edema on chest x-ray.  Respiratory panel negative.  UA without infection.  EBV in process, blood cultures negative, lactic acid normal.  Haptoglobin within normal limits, peripheral smear with few reactive lymphocytes, toxic granulation, platelets appear decreased.  LDH 242.  Repeat chest x-ray yesterday with no significant change in vascular congestion/bibasilar atelectasis.  No evidence of pneumonia.  Case has been discussed with oncology and patient is pending bone marrow biopsy.  Elevated T bilirubin: Likely related to acute illness.  Fractionated shows mildly elevated direct bilirubin of 0.3, with now normalized total bilirubin.  Normal liver and gallbladder on CT, other LFTs within normal limits.   Plan: Clear liquid diet PPI twice daily Trend H&H,  transfuse for hemoglobin less than 7 Workup of fever/pancytopenia per hospitalist/oncologist Consider EGD/TCS once medically optimized   LOS: 2 days    07/25/2023, 10:50 AM   Stephanie Wood L. Jeanmarie Hubert, MSN, APRN, AGNP-C Adult-Gerontology Nurse Practitioner [facility]

## 2023-07-25 NOTE — Progress Notes (Signed)
 PT Cancellation Note  Patient Details Name: KAIRIE VANGIESON MRN: [id number] DOB: [birth year]   Cancelled Treatment:    Reason Eval/Treat Not Completed: Medical issues which prohibited therapy. Patient has high temp and receiving blood transfusion.  Will check back tomorrow.   2:13 PM, 07/25/23 Ocie Bob, MPT Physical Therapist with [facility] 336 [phone] office [phone] mobile phone

## 2023-07-25 NOTE — Plan of Care (Signed)

## 2023-07-25 NOTE — Progress Notes (Signed)
 Dr.Shahmehdi notified of pt's temp being 100.0. Ok to start blood transfusion per MD.

## 2023-07-25 NOTE — Consult Note (Cosign Needed Addendum)
 Chief Complaint: Patient was seen in consultation today for pancytopenia   Procedure: Bone Marrow Biopsy  Referring Physician(s): Dr. Kevan Ny / Dr. Marice Potter (Onc)  Supervising Physician: Marliss Coots  Patient Status: Stephanie Wood Inpatient  History of Present Illness: Stephanie Wood is a 89 y.o. female with a history of GERD, IBS-D, dysphagia, hiatal hernia, and colonic adenoma who presented to the [facility] ED via EMS following a mechanical fall at home. Patient was reportedly walking to the bathroom when she started to feel weak and unsteady on her feet. Patient was recently seen as an outpatient by GI on 07/10/23 d/t concerns for heme positive stools. In the ED she was found to have new onset pancytopenia with WBC 1.3 RBC 3.21 H/H 9.4/29.4 and platelets 50. Previous levels in September 2024 were all WNL except for platelets at 117. Given unclear etiology of pancytopenia, Dr. Flossie Dibble discussed case with Dr. Ellin Saba (onc). Bone marrow biopsy recommended for further evaluation. Plan is to follow up with Dr. Ellin Saba as an outpatient.   Patient is currently resting in bed with her niece at the bedside. She admits to feeling feverish and having intermittent chills since being in the hospital. She also endorses intermittent LLQ pain and back pain due to a prior back fracture that was exacerbated during her transport by EMS. Patient has concerns regarding lying on her abdomen for the procedure. Reassured patient that we would work with her to make her comfortable during the procedure. Discussed DNR-Limited status with both patient and her daughter via telephone. Daughter is currently flying back to [place] from [place] and stated that she would like to be able to speak to her mother in person before making any final decisions regarding code status for the procedure.   Patient is febrile, tachycardic, and intermittently hypotensive. H/H 7.4/23.4 on 07/25/23.  All of the patient and  daughter's questions and concerns were answered at the bedside.   Past Medical History:  Diagnosis Date   Acid reflux disease    Anemia    Anxiety    Colonic adenoma    Depression    Irritable bowel syndrome     Past Surgical History:  Procedure Laterality Date   CATARACT EXTRACTION     COLONOSCOPY N/A 04/15/2014   Procedure: COLONOSCOPY;  Surgeon: Malissa Hippo, MD;  Location: [facility];  Service: Endoscopy;  Laterality: N/A;  730   ESOPHAGOGASTRODUODENOSCOPY  09/20/2011   Procedure: ESOPHAGOGASTRODUODENOSCOPY (EGD);  Surgeon: Malissa Hippo, MD;  Location: [facility];  Service: Endoscopy;  Laterality: N/A;  215   ESOPHAGOGASTRODUODENOSCOPY (EGD) WITH PROPOFOL N/A 03/16/2022   Procedure: ESOPHAGOGASTRODUODENOSCOPY (EGD) WITH PROPOFOL;  Surgeon: Dolores Frame, MD;  Location: [facility];  Service: Gastroenterology;  Laterality: N/A;  1230 ASA 3   EYE SURGERY     bil catatract surgery    Allergies: Drug class [trazodone and nefazodone] and Macrobid [nitrofurantoin]  Medications: Prior to Admission medications   Medication Sig Start Date End Date Taking? Authorizing Provider  ALPRAZolam Prudy Feeler) 0.5 MG tablet Take 0.5 mg by mouth at bedtime.   Yes [provider]  atorvastatin (LIPITOR) 20 MG tablet Take 20 mg by mouth daily.   Yes [provider]  bifidobacterium infantis (ALIGN) capsule Take 1 capsule by mouth daily.   Yes [provider]  Calcium Carbonate (CALCIUM 600 PO) Take 600 mg by mouth daily.   Yes [provider]  dicyclomine (BENTYL) 10 MG capsule Take 1 capsule (  10 mg total) by mouth every 12 (twelve) hours as needed for spasms (abdominal pain). 09/25/22  Yes Dolores Frame, MD  ELDERBERRY PO Take 1 capsule by mouth daily.   Yes [provider]  fluticasone (CUTIVATE) 0.05 % cream Apply 1 Application topically 2 (two) times daily as needed (skin irritiation/rash). 07/09/23  Yes [provider]  furosemide (LASIX) 20 MG tablet Take 1 tablet (20 mg total) by mouth daily. 05/28/23  Yes Mallipeddi, Vishnu P, MD  ketoconazole (NIZORAL) 2 % cream Apply 1 Application topically 2 (two) times daily as needed for irritation. 07/09/23  Yes [provider]  loperamide (IMODIUM) 2 MG capsule Take 2 mg by mouth as needed for diarrhea or loose stools. 10/23/17  Yes Rehman, Joline Maxcy, MD  loratadine (CLARITIN) 10 MG tablet Take 1 tablet (10 mg total) by mouth daily. 02/12/23  Yes Billie Lade, MD  methenamine (HIPREX) 1 g tablet TAKE 1 TABLET BY MOUTH TWICE DAILY. 05/04/23  Yes Dahlstedt, Jeannett Senior, MD  omeprazole (PRILOSEC) 40 MG capsule TAKE 1 CAPSULE BY MOUTH DAILY 06/11/23  Yes Dolores Frame, MD  PROLIA 60 MG/ML SOSY injection Inject 60 mg into the skin every 6 (six) months. 05/01/23  Yes Billie Lade, MD  vitamin B-12 (CYANOCOBALAMIN) 1000 MCG tablet Take 1,000 mcg by mouth daily.   Yes [provider]     Family History  Problem Relation Age of Onset   Prostate cancer Father     Social History   Socioeconomic History   Marital status: Widowed    Spouse name: Not on file   Number of children: 1   Years of education: Not on file   Highest education level: Not on file  Occupational History   Occupation: reitred  Tobacco Use   Smoking status: Never    Passive exposure: Never   Smokeless tobacco: Never  Vaping Use   Vaping status: Never Used  Substance and Sexual Activity   Alcohol use: No    Alcohol/week: 0.0 standard drinks of alcohol   Drug use: No   Sexual activity: Not on file  Other Topics Concern   Not on file  Social History Narrative   Not on file   Social Drivers of Health   Financial Resource Strain: Low Risk  (12/20/2022)   Overall Financial Resource Strain (CARDIA)    Difficulty of Paying Living Expenses: Not hard at all  Food Insecurity: No Food Insecurity (07/23/2023)   Hunger Vital Sign    Worried About Running Out  of Food in the Last Year: Never true    Ran Out of Food in the Last Year: Never true  Transportation Needs: No Transportation Needs (07/23/2023)   PRAPARE - Administrator, Civil Service (Medical): No    Lack of Transportation (Non-Medical): No  Physical Activity: Sufficiently Active (12/20/2022)   Exercise Vital Sign    Days of Exercise per Week: 7 days    Minutes of Exercise per Session: 30 min  Stress: No Stress Concern Present (12/20/2022)   [employer] of Occupational Health - Occupational Stress Questionnaire    Feeling of Stress : Not at all  Social Connections: Socially Integrated (07/23/2023)   Social Connection and Isolation Panel [NHANES]    Frequency of Communication with Friends and Family: More than three times a week    Frequency of Social Gatherings with Friends and Family: More than three times a week    Attends Religious Services: More than 4 times  per year    Active Member of Clubs or Organizations: Yes    Attends Banker Meetings: More than 4 times per year    Marital Status: Married    Review of Systems  Constitutional:  Positive for chills and fever.  Gastrointestinal:  Positive for abdominal pain (Intermittent LLQ pain).  Musculoskeletal:  Positive for back pain.  Denies any headache, N/V, chest pain, shortness of breath. All other ROS negative.  Vital Signs: BP (!) 97/54   Pulse (!) 109   Temp (!) 101.1 F (38.4 C) (Oral)   Resp (!) 39   Ht 5\' 4"  (1.626 m)   Wt 156 lb 4.9 oz (70.9 kg)   SpO2 93%   BMI 26.83 kg/m    Physical Exam Vitals reviewed.  Constitutional:      Appearance: Normal appearance.  HENT:     Head: Normocephalic and atraumatic.     Mouth/Throat:     Mouth: Mucous membranes are moist.     Pharynx: Oropharynx is clear.  Cardiovascular:     Rate and Rhythm: Regular rhythm. Tachycardia present.     Heart sounds: Normal heart sounds.  Pulmonary:     Effort: Pulmonary effort is normal.     Breath  sounds: Normal breath sounds.  Abdominal:     General: Abdomen is flat.     Palpations: Abdomen is soft.     Tenderness: There is no abdominal tenderness.  Musculoskeletal:        General: Normal range of motion.     Cervical back: Normal range of motion.  Skin:    General: Skin is warm and dry.  Neurological:     General: No focal deficit present.     Mental Status: She is alert and oriented to person, place, and time. Mental status is at baseline.  Psychiatric:        Mood and Affect: Mood normal.        Behavior: Behavior normal.        Judgment: Judgment normal.     Imaging: Portable chest 1 View Result Date: 07/24/2023 CLINICAL DATA:  Fever. EXAM: PORTABLE CHEST 1 VIEW COMPARISON:  Radiographs 07/22/2022 and 08/23/2013. Abdominal CT 07/23/2023 FINDINGS: 0650 hours. Persistent low lung volumes. The heart size and mediastinal contours are stable with a moderate size hiatal hernia. No significant change in vascular congestion and bibasilar atelectasis from yesterday. No confluent airspace disease, pneumothorax or acute osseous abnormality identified. IMPRESSION: No significant change in vascular congestion and bibasilar atelectasis from yesterday. No evidence of pneumonia. Electronically Signed   By: Carey Bullocks M.D.   On: 07/24/2023 10:54   DG Chest Portable 1 View Result Date: 07/23/2023 CLINICAL DATA:  Cough.  Dizziness with fall. EXAM: PORTABLE CHEST 1 VIEW COMPARISON:  Radiographs 08/23/2013 and 07/28/2013. Abdominal CT 07/23/2023. FINDINGS: 1153 hours. Low lung volumes. The heart size and mediastinal contours are stable with a moderate size hiatal hernia. There is vascular congestion with mildly increased atelectasis at both lung bases. No confluent airspace disease, pleural effusion or pneumothorax. No acute fractures are demonstrated. IMPRESSION: Low lung volumes with vascular congestion and mildly increased atelectasis at both lung bases. No evidence of pneumonia or pulmonary  edema. Electronically Signed   By: Carey Bullocks M.D.   On: 07/23/2023 13:44   CT ABDOMEN PELVIS W CONTRAST Result Date: 07/23/2023 CLINICAL DATA:  89 year old female status post fall. Dizziness. Abdominal pain. EXAM: CT ABDOMEN AND PELVIS WITH CONTRAST TECHNIQUE: Multidetector CT imaging of the abdomen and  pelvis was performed using the standard protocol following bolus administration of intravenous contrast. RADIATION DOSE REDUCTION: This exam was performed according to the departmental dose-optimization program which includes automated exposure control, adjustment of the mA and/or kV according to patient size and/or use of iterative reconstruction technique. CONTRAST:  OMNIPAQUE IOHEXOL 300 MG/ML  SOLN COMPARISON:  CT Abdomen and Pelvis 01/10/2022. FINDINGS: Lower chest: Moderate to large gastric hiatal hernia does not appear significantly changed since 2023. Superimposed mild cardiomegaly is stable. No pericardial effusion. Chronic increased AP dimension to the lungs. Trace new pleural effusions. Patchy, streaky new dependent lung opacity. Hepatobiliary: Negative liver and gallbladder. Pancreas: Partially atrophied. Spleen: Negative. Adrenals/Urinary Tract: Stable since 2023 and negative. Symmetric renal enhancement and early contrast excretion. Benign left renal cysts (no follow-up imaging recommended). Stomach/Bowel: Fluid in the right colon and transverse colon, but decompressed distal transverse through rectum colonic segments. No large bowel wall thickening identified. Appendix remains normal on coronal image 68. Decompressed terminal ileum and no dilated small bowel. Chronic partially intrathoracic stomach. Second portion duodenum chronic diverticulum with no active inflammation. No free air, free fluid, or mesenteric inflammation identified. Vascular/Lymphatic: Mild for age Aortoiliac calcified atherosclerosis. Normal caliber abdominal aorta. Major arterial structures and portal venous system  appear to be patent. No lymphadenopathy. Reproductive: Negative. Other: No pelvis free fluid. Small fat containing right inguinal hernia is chronic and stable. Musculoskeletal: No displaced lower rib fracture is identified. Chronic L1 compression fracture is moderate to severe but stable since 2023. Stable visible vertebral height otherwise. Chronic osteopenia. No acute osseous abnormality identified. IMPRESSION: 1. Trace new pleural effusions and patchy dependent lung opacity which more resembles atelectasis than infection. Superimposed chronic moderate to large gastric hiatal hernia. 2. No acute traumatic injury or acute/inflammatory process identified in the abdomen or pelvis. Normal appendix. Chronic L1 compression fracture. 3.  Aortic Atherosclerosis (ICD10-I70.0). Electronically Signed   By: Odessa Fleming M.D.   On: 07/23/2023 10:30   CT Head Wo Contrast Result Date: 07/23/2023 CLINICAL DATA:  Provided history: Syncope/presyncope, cerebrovascular cause suspected. EXAM: CT HEAD WITHOUT CONTRAST TECHNIQUE: Contiguous axial images were obtained from the base of the skull through the vertex without intravenous contrast. RADIATION DOSE REDUCTION: This exam was performed according to the departmental dose-optimization program which includes automated exposure control, adjustment of the mA and/or kV according to patient size and/or use of iterative reconstruction technique. COMPARISON:  None. FINDINGS: Brain: Generalized cerebral and cerebellar atrophy. Patchy and ill-defined hypoattenuation within the cerebral white matter, nonspecific but compatible with mild chronic small vessel ischemic disease. There is no acute intracranial hemorrhage. No demarcated cortical infarct. No extra-axial fluid collection. No evidence of an intracranial mass. No midline shift. Vascular: No hyperdense vessel. Atherosclerotic calcifications. Skull: No calvarial fracture or aggressive osseous lesion. Sinuses/Orbits: No mass or acute finding  within the imaged orbits. Minimal mucosal thickening within the bilateral ethmoid and right sphenoid sinuses. IMPRESSION: 1. No evidence of an acute intracranial abnormality. 2. Mild chronic small vessel ischemic changes within the cerebral white matter. 3. Generalized parenchymal atrophy. Electronically Signed   By: Jackey Loge D.O.   On: 07/23/2023 10:13    Labs:  CBC: Recent Labs    02/12/23 1142 07/23/23 0842 07/24/23 0526 07/25/23 0510 07/25/23 1144  WBC 4.0 1.3* 1.7* 2.1*  --   HGB 12.3 9.4* 7.4* 7.0* 7.4*  HCT 38.1 29.4* 23.4* 21.8* 23.4*  PLT 117* 50* 35* 30*  --     COAGS: No results for input(s): "INR", "APTT" in  the last 8760 hours.  BMP: Recent Labs    05/22/23 0926 07/23/23 0842 07/24/23 0526 07/25/23 0510  NA 143 134* 137 132*  K 4.3 3.0* 3.2* 3.8  CL 104 96* 104 103  CO2 26 26 24  20*  GLUCOSE 99 129* 101* 102*  BUN 19 12 11 11   CALCIUM 9.7 9.0 7.9* 7.0*  CREATININE 1.28* 0.93 0.89 1.00  GFRNONAA  --  60* >60 55*    LIVER FUNCTION TESTS: Recent Labs    02/12/23 1142 07/23/23 0842 07/24/23 0526 07/24/23 0927  BILITOT 0.6 1.1 1.4* 1.2  AST 20 25 31   --   ALT 14 19 16   --   ALKPHOS 91 95 81  --   PROT 6.6 7.5 6.0*  --   ALBUMIN 4.2 3.2* 2.5*  --     TUMOR MARKERS: No results for input(s): "AFPTM", "CEA", "CA199", "CHROMGRNA" in the last 8760 hours.  Assessment and Plan:  89 y.o. female with a history of GERD, IBS-D, dysphagia, hiatal hernia, and colonic adenoma who presented to the [facility] ED via EMS following a mechanical fall at home. Patient was reportedly walking to the bathroom when she started to feel weak and unsteady on her feet. Patient was recently seen as an outpatient by GI on 07/10/23 d/t concerns for heme positive stools. In the ED she was found to have new onset pancytopenia with WBC 1.3 RBC 3.21 H/H 9.4/29.4 and platelets 50. Previous levels in September 2024 were all WNL except for platelets at 117. Given unclear etiology of  pancytopenia, Dr. Flossie Dibble discussed case with Dr. Ellin Saba (onc). Bone marrow biopsy recommended for further evaluation. Plan is to follow up with Dr. Ellin Saba as an outpatient.   -Tentatively plan for transport to [facility] on 07/27/23 for bone marrow biopsy  -Need to obtain consent  -Need to clarify code status -NPO at midnight -Morning labs scheduled  Risks and benefits of bone marrow biopsy was discussed with the patient and/or patient's family including, but not limited to bleeding, infection, damage to adjacent structures or low yield requiring additional tests.  All of the questions were answered and there is agreement to proceed.  Consent signed and in chart.  Thank you for this interesting consult. I greatly enjoyed meeting DOROTHIA PASSMORE and look forward to participating in their care. A copy of this report was sent to the requesting provider on this date.  Electronically Signed: Jama Flavors, PA-C 07/25/2023, 2:32 PM   I spent a total of 20 Minutes in face to face clinical consultation, greater than 50% of which was counseling/coordinating care for bone marrow biopsy.

## 2023-07-25 NOTE — Telephone Encounter (Signed)
 Per Dr.Castaneda; Hi, patient is currently hospitalized at [facility], please cancel EGD next week, thanks  EGD ASA 3

## 2023-07-25 NOTE — Progress Notes (Addendum)
 Dr.Shahmehdi notified of pt's BP low, temp better and blood to be given. Dr.Shahmehdi to put in orders for low BP  MD into see pt. Aware blood infusing at and of BP 98/39(58)  MD ok with current blood pressure. Bolus to be given in between units if needed.

## 2023-07-25 NOTE — Progress Notes (Signed)
 Per Dr.Shahmehdi Pt's next unit to be given next shift due to pt's fluid status ( pt's breathing becoming more difficult) and PICC line in progress at this time. Bolus to be given only if needed for BP per MD.

## 2023-07-25 NOTE — Progress Notes (Signed)
 PICC to right arm attempt unsuccessful. Would not advance complete cath and felt like it was coiling despite repositioning. Pt tolerated procedure well. Pressure held with Vaseline gauze  and pressure dressing applied. EDP aware.

## 2023-07-25 NOTE — Hospital Course (Addendum)
 Stephanie Wood is a 89 y.o. 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 Patient lives at home and able to take care of her ADLs. 2/11, seen by GI as an outpatient with concern of black stools, poor appetite. Elective EGD was planned for March. Patient continued to have black stool leading to progressive weakness.   2/24, patient felt dizzy, unsteady and had a fall and hence brought to the ED by EMS from home.   ED:  Tmax. 101.2, heart rate in 90s, blood pressure 128/58, breathing on 2 L oxygen by nasal cannula. Labs with WBC count 1.3, hemoglobin 9.4, platelets 50, sodium 134, potassium 3, magnesium 1.4, phosphorus 1.5. Urinalysis unremarkable Fecal occult blood positive CT abdomen pelvis with contrast showed chronic moderate to large gastric hiatal hernia, spleen normal CT head showed mild chronic small vessel ischemic changes, generalized parenchymal atrophy. GI was consulted   Assessment and plan:   Sepsis - POA  Septic shock with hypotension -07/27/2023- S/P Bone marrow biopsy  -07/28/2023, or awake, hemodynamically stable afebrile, now hypertensive  -On 07/25/2023 patient met sepsis criteria with, tachypnea, Tmax 103.1, tachycardia -IV fluid was initiated, with 2U PRBC blood transfusion -Broaden antibiotics to vancomycin, cefepime and Flagyl>>> discontinue vancomycin, changing Flagyl to p.o.  -No source of infection identified yet -UA, chest x-ray reviewed -Lactic acid 1.1, 1.2, WBC 1.8, = For hypotension, started on midodrine orally = Levophed gtt ordered  -PICC line was placed 12/23/2023 -Modify antibiotics discontinued Vanc.  New onset anemia -Heme positive stool -Presented with progressive weakness, drop in hemoglobin. Baseline hemoglobin 12.3 from September 2024, hemoglobin on admission 9.4.  Also has low WBC count and low platelet count FOBT positive but no  overtly bleeding. GI was consulted.  EGD/colonoscopy -deferred due to thrombocytopenia Continue IV Protonix 40 mg twice daily    Latest Ref Rng & Units 07/28/2023    2:36 AM 07/27/2023    6:35 PM 07/27/2023    2:51 AM  CBC  WBC 4.0 - 10.5 K/uL 1.0   1.5   Hemoglobin 12.0 - 15.0 g/dL 9.5  9.0  9.4   Hematocrit 36.0 - 46.0 % 28.3  27.1  28.1   Platelets 150 - 400 K/uL 111   136    07/25/2023-received 2U PRBC blood transfusion (Discussed with her son-in-law, daughter and patient at bedside)   Pancytopenia All cell lines are depressed. Last blood work from September 2024 with normal WBC count, hemoglobin and platelet only mildly low at 117.   Unclear etiology -sepsis related versus possible underlying malignancy -Received 2 units of platelets on 07/27/2023  -Blood cultures remain negative - -Discussed with Dr. Alto Denver with pursuing bone marrow biopsy 07/27/2023 Acute leukemia with acute reaction is on differential diagnosis  Patient is to follow-up as an outpatient    Iron/TIBC/Ferritin/ %Sat    Component Value Date/Time   IRON 19 (L) 07/25/2023 0510   TIBC 152 (L) 07/25/2023 0510   FERRITIN 704 (H) 07/25/2023 0510   IRONPCTSAT 13 07/25/2023 0510  Folate 14.4, B12 893,  Initiating oral and IM/ IV Iron supplements   Thrombocytopenia -Due to presumed GI bleed, anemia -received 2U PRBC -Received 2 units of platelets on 07/26/2023 -Platelets: 35, 30, 29>>> 136 -Will transfuse 2U of platelets  07/26/2023    Hypokalemia/hypomagnesemia/hypophosphatemia Oral and IV replacement given. Continue to monitor and replete accordingly   IBS-D At home on Bentyl, Imodium   Lower extremity edema Echo from  2023 showing EF 60% and Grade 1 Diastolic dysfunction. No evidence of fluid overload.  At home on Lasix 20 g daily (20 mg IV was given post blood transfusion on 2/27)  Currently on hold   Chronic UTI No active complaints. UA relatively unremarkable.  Hold home  Methenamine   Anxiety continue home Xanax PRN.    HLD On Statin   Severe debility with falls -Once stable consulting PT OT for evaluation likely will need home health versus SNF placement

## 2023-07-26 DIAGNOSIS — R6521 Severe sepsis with septic shock: Secondary | ICD-10-CM | POA: Diagnosis not present

## 2023-07-26 DIAGNOSIS — A419 Sepsis, unspecified organism: Secondary | ICD-10-CM | POA: Diagnosis present

## 2023-07-26 DIAGNOSIS — K922 Gastrointestinal hemorrhage, unspecified: Secondary | ICD-10-CM | POA: Diagnosis not present

## 2023-07-26 LAB — CBC WITH DIFFERENTIAL/PLATELET
Abs Immature Granulocytes: 0 10*3/uL (ref 0.00–0.07)
Band Neutrophils: 5 %
Basophils Absolute: 0 10*3/uL (ref 0.0–0.1)
Basophils Relative: 0 %
Eosinophils Absolute: 0 10*3/uL (ref 0.0–0.5)
Eosinophils Relative: 0 %
HCT: 28.5 % — ABNORMAL LOW (ref 36.0–46.0)
Hemoglobin: 9.6 g/dL — ABNORMAL LOW (ref 12.0–15.0)
Lymphocytes Relative: 21 %
Lymphs Abs: 0.4 10*3/uL — ABNORMAL LOW (ref 0.7–4.0)
MCH: 30.1 pg (ref 26.0–34.0)
MCHC: 33.7 g/dL (ref 30.0–36.0)
MCV: 89.3 fL (ref 80.0–100.0)
Monocytes Absolute: 0.1 10*3/uL (ref 0.1–1.0)
Monocytes Relative: 5 %
Neutro Abs: 1.3 10*3/uL — ABNORMAL LOW (ref 1.7–7.7)
Neutrophils Relative %: 69 %
Platelets: 29 10*3/uL — CL (ref 150–400)
RBC: 3.19 MIL/uL — ABNORMAL LOW (ref 3.87–5.11)
RDW: 17.8 % — ABNORMAL HIGH (ref 11.5–15.5)
WBC: 1.8 10*3/uL — ABNORMAL LOW (ref 4.0–10.5)
nRBC: 0 % (ref 0.0–0.2)
nRBC: 1 /100{WBCs} — ABNORMAL HIGH

## 2023-07-26 LAB — BASIC METABOLIC PANEL
Anion gap: 9 (ref 5–15)
BUN: 17 mg/dL (ref 8–23)
CO2: 17 mmol/L — ABNORMAL LOW (ref 22–32)
Calcium: 6.6 mg/dL — ABNORMAL LOW (ref 8.9–10.3)
Chloride: 106 mmol/L (ref 98–111)
Creatinine, Ser: 0.95 mg/dL (ref 0.44–1.00)
GFR, Estimated: 58 mL/min — ABNORMAL LOW (ref >60)
Glucose, Bld: 122 mg/dL — ABNORMAL HIGH (ref 70–99)
Potassium: 4.1 mmol/L (ref 3.5–5.1)
Sodium: 132 mmol/L — ABNORMAL LOW (ref 135–145)

## 2023-07-26 LAB — TYPE AND SCREEN
ABO/RH(D): A POS
Antibody Screen: NEGATIVE
Unit division: 0
Unit division: 0
Unit division: 0

## 2023-07-26 LAB — BPAM RBC
Blood Product Expiration Date: 202503012359
Blood Product Expiration Date: 202503212359
Blood Product Expiration Date: 202503212359
ISSUE DATE / TIME: 202502261324
ISSUE DATE / TIME: 202502261539
ISSUE DATE / TIME: 202502261948
Unit Type and Rh: 6200
Unit Type and Rh: 6200
Unit Type and Rh: 9500

## 2023-07-26 LAB — PROCALCITONIN: Procalcitonin: 21.9 ng/mL

## 2023-07-26 LAB — HEMOGLOBIN AND HEMATOCRIT, BLOOD
HCT: 28.5 % — ABNORMAL LOW (ref 36.0–46.0)
HCT: 26.7 % — ABNORMAL LOW (ref 36.0–46.0)
Hemoglobin: 9.6 g/dL — ABNORMAL LOW (ref 12.0–15.0)
Hemoglobin: 9 g/dL — ABNORMAL LOW (ref 12.0–15.0)

## 2023-07-26 LAB — PHOSPHORUS: Phosphorus: 1.6 mg/dL — ABNORMAL LOW (ref 2.5–4.6)

## 2023-07-26 LAB — MAGNESIUM: Magnesium: 1.5 mg/dL — ABNORMAL LOW (ref 1.7–2.4)

## 2023-07-26 MED ORDER — FUROSEMIDE 10 MG/ML IJ SOLN
20.0000 mg | Freq: Once | INTRAMUSCULAR | Status: AC
  Administered 2023-07-26: 20 mg via INTRAVENOUS
  Filled 2023-07-26: qty 2

## 2023-07-26 MED ORDER — SODIUM CHLORIDE 0.9% IV SOLUTION: Freq: Once | INTRAVENOUS | Status: AC

## 2023-07-26 MED ORDER — HYDROCORTISONE SOD SUC (PF) 100 MG IJ SOLR
100.0000 mg | Freq: Three times a day (TID) | INTRAMUSCULAR | Status: DC
  Administered 2023-07-26 – 2023-07-28 (×6): 100 mg via INTRAVENOUS
  Filled 2023-07-26 (×6): qty 2

## 2023-07-26 MED ORDER — METHOCARBAMOL 1000 MG/10ML IJ SOLN: 500.0000 mg | Freq: Three times a day (TID) | INTRAMUSCULAR | Status: DC | PRN

## 2023-07-26 MED ORDER — MAGNESIUM SULFATE 2 GM/50ML IV SOLN
2.0000 g | Freq: Once | INTRAVENOUS | Status: AC
  Administered 2023-07-26: 2 g via INTRAVENOUS
  Filled 2023-07-26: qty 50

## 2023-07-26 MED ORDER — SODIUM CHLORIDE 0.9% FLUSH
10.0000 mL | Freq: Two times a day (BID) | INTRAVENOUS | Status: DC
  Administered 2023-07-26: 20 mL
  Administered 2023-07-27 – 2023-08-01 (×11): 10 mL

## 2023-07-26 MED ORDER — SODIUM CHLORIDE 0.9% FLUSH: 10.0000 mL | INTRAVENOUS | Status: DC | PRN

## 2023-07-26 MED ORDER — VANCOMYCIN HCL 750 MG/150ML IV SOLN
750.0000 mg | INTRAVENOUS | Status: DC
  Administered 2023-07-26 – 2023-07-27 (×2): 750 mg via INTRAVENOUS
  Filled 2023-07-26 (×2): qty 150

## 2023-07-26 NOTE — Progress Notes (Signed)
 Peripherally Inserted Central Catheter Placement  The IV Nurse has discussed with the patient and/or persons authorized to consent for the patient, the purpose of this procedure and the potential benefits and risks involved with this procedure.  The benefits include less needle sticks, lab draws from the catheter, and the patient may be discharged home with the catheter. Risks include, but not limited to, infection, bleeding, blood clot (thrombus formation), and puncture of an artery; nerve damage and irregular heartbeat and possibility to perform a PICC exchange if needed/ordered by physician.  Alternatives to this procedure were also discussed.  Bard Power PICC patient education guide, fact sheet on infection prevention and patient information card has been provided to patient /or left at bedside.    PICC Placement Documentation  PICC Double Lumen 07/26/23 Left Basilic 42 cm 0 cm (Active)  Indication for Insertion or Continuance of Line Vasoactive infusions 07/26/23 1503  Exposed Catheter (cm) 0 cm 07/26/23 1503  Site Assessment Clean, Dry, Intact 07/26/23 1503  Lumen #1 Status Flushed;Saline locked;Blood return noted 07/26/23 1503  Lumen #2 Status Flushed;Saline locked;Blood return noted 07/26/23 1503  Dressing Type Transparent;Securing device 07/26/23 1503  Dressing Status Antimicrobial disc/dressing in place;Clean, Dry, Intact 07/26/23 1503  Line Care Connections checked and tightened 07/26/23 1503  Line Adjustment (NICU/IV Team Only) No 07/26/23 1503  Dressing Intervention New dressing;Adhesive placed at insertion site (IV team only) 07/26/23 1503  Dressing Change Due 08/02/23 07/26/23 1503       Reginia Forts Albarece 07/26/2023, 3:04 PM

## 2023-07-26 NOTE — Progress Notes (Signed)
 PT Cancellation Note  Patient Details Name: JOANI COSMA MRN: [id number] DOB: 1935-04-17   Cancelled Treatment:    Reason Eval/Treat Not Completed: Medical issues which prohibited therapy.  Physical therapy held per MD due to low BP.  Will check back tomorrow.   2:19 PM, 07/26/23 Ocie Bob, MPT Physical Therapist with [facility] [phone] office [phone] mobile phone

## 2023-07-26 NOTE — Plan of Care (Signed)
  Problem: Education: Goal: Knowledge of General Education information will improve Description: Including pain rating scale, medication(s)/side effects and non-pharmacologic comfort measures Outcome: Not Progressing   Problem: Health Behavior/Discharge Planning: Goal: Ability to manage health-related needs will improve Outcome: Progressing   Problem: Clinical Measurements: Goal: Ability to maintain clinical measurements within normal limits will improve Outcome: Progressing Goal: Will remain free from infection Outcome: Progressing Goal: Diagnostic test results will improve Outcome: Progressing Goal: Respiratory complications will improve Outcome: Progressing Goal: Cardiovascular complication will be avoided Outcome: Not Progressing   Problem: Activity: Goal: Risk for activity intolerance will decrease Outcome: Not Progressing   Problem: Nutrition: Goal: Adequate nutrition will be maintained Outcome: Progressing   Problem: Coping: Goal: Level of anxiety will decrease Outcome: Progressing   Problem: Elimination: Goal: Will not experience complications related to bowel motility Outcome: Progressing Goal: Will not experience complications related to urinary retention Outcome: Progressing   Problem: Pain Managment: Goal: General experience of comfort will improve and/or be controlled Outcome: Progressing   Problem: Safety: Goal: Ability to remain free from injury will improve Outcome: Progressing   Problem: Skin Integrity: Goal: Risk for impaired skin integrity will decrease Outcome: Progressing   Problem: Fluid Volume: Goal: Hemodynamic stability will improve Outcome: Progressing   Problem: Clinical Measurements: Goal: Diagnostic test results will improve Outcome: Progressing Goal: Signs and symptoms of infection will decrease Outcome: Progressing   Problem: Respiratory: Goal: Ability to maintain adequate ventilation will improve Outcome: Progressing

## 2023-07-26 NOTE — Progress Notes (Signed)
 OT Cancellation Note  Patient Details Name: Stephanie Wood MRN: [id number] DOB: Oct 12, 1934   Cancelled Treatment:    Reason Eval/Treat Not Completed: Medical issues which prohibited therapy. MD stated to hold on pt till tomorrow due to blood pressure issues. Will attempt to evaluate pt tomorrow if time permits  [facility] OT, MOT    Danie Chandler 07/26/2023, 9:06 AM

## 2023-07-26 NOTE — Progress Notes (Signed)
 PROGRESS NOTE    Patient: Stephanie Wood                            PCP: Billie Lade, MD                    DOB: 06-19-1934            DOA: 07/23/2023 EAV:[id number]             DOS: 07/26/2023, 10:00 AM   LOS: 3 days   Date of Service: The patient was seen and examined on 07/26/2023  Subjective:   The patient was seen and examined this morning, lethargic but arousable, following commands  Tmax yesterday 103.1 currently 97.9 Still hypotensive, blood pressure 86/52,  Patient met sepsis criteria, IV fluid, broad-spectrum antibiotics will continue  Yesterday afternoon over night patient, BP remains soft, Patient received 2U PRBC blood transfusion on 10/22/2023-tolerated well PICC line was unsuccessful Midodrine was added for BP stabilization  Discussed with daughter, son-in-law patient remains full code  Brief Narrative:   Stephanie Wood is a 89 y.o. 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 Patient lives at home and able to take care of her ADLs. 2/11, seen by GI as an outpatient with concern of black stools, poor appetite. Elective EGD was planned for March. Patient continued to have black stool leading to progressive weakness.   2/24, patient felt dizzy, unsteady and had a fall and hence brought to the ED by EMS from home.   ED:  Tmax. 101.2, heart rate in 90s, blood pressure 128/58, breathing on 2 L oxygen by nasal cannula. Labs with WBC count 1.3, hemoglobin 9.4, platelets 50, sodium 134, potassium 3, magnesium 1.4, phosphorus 1.5. Urinalysis unremarkable Fecal occult blood positive CT abdomen pelvis with contrast showed chronic moderate to large gastric hiatal hernia, spleen normal CT head showed mild chronic small vessel ischemic changes, generalized parenchymal atrophy. GI was consulted   Assessment and plan:   Sepsis - POA  Septic shock with  hypotension -On 07/25/2023 patient met sepsis criteria with, tachypnea, Tmax 103.1, tachycardia -IV fluid was initiated, with 2U PRBC blood transfusion -Broaden antibiotics to vancomycin, cefepime and Flagyl -No source of infection identified yet -UA, chest x-ray reviewed -Lactic acid 1.1, 1.2, WBC 1.8, = For hypotension, started on midodrine orally = Levophed gtt ordered  (Pending PICC line placement, was unsuccessful yesterday)  Patient is more stable today  New onset anemia -Heme positive stool -Presented with progressive weakness, drop in hemoglobin. Baseline hemoglobin 12.3 from September 2024, hemoglobin on admission 9.4.  Also has low WBC count and low platelet count FOBT positive but no overtly bleeding. GI consulted.  EGD/colonoscopy -pending due to thrombocytopenia Continue IV Protonix 40 mg twice daily    Latest Ref Rng & Units 07/26/2023    3:38 AM 07/25/2023    7:45 PM 07/25/2023   11:44 AM  CBC  WBC 4.0 - 10.5 K/uL 1.8     Hemoglobin 12.0 - 15.0 g/dL 9.6  8.9  7.4   Hematocrit 36.0 - 46.0 % 28.5  27.4  23.4   Platelets 150 - 400 K/uL 29      07/25/2023-received 2U PRBC blood transfusion (Discussed with her son-in-law, daughter and patient at bedside)   Pancytopenia All cell lines are depressed. Last blood work from September 2024 with normal WBC count, hemoglobin  and platelet only mildly low at 117.   Unclear etiology -sepsis related versus possible underlying malignancy Blood culture sent.  If it is infection related, counts should improve gradually.   If workup negative for infection, primary malignancy has to be considered.   Discussed with oncologist Dr. Ellin Saba. Patient is to follow-up as an outpatient    Iron/TIBC/Ferritin/ %Sat    Component Value Date/Time   IRON 19 (L) 07/25/2023 0510   TIBC 152 (L) 07/25/2023 0510   FERRITIN 704 (H) 07/25/2023 0510   IRONPCTSAT 13 07/25/2023 0510  Folate 14.4, B12 893,  Initiating oral and IM/ IV Iron  supplements   Thrombocytopenia -Due to presumed GI bleed, anemia -received 2U PRBC -Platelets: 35, 30, 29 -Will transfuse 2U of platelets today 07/26/2023    Hypokalemia/hypomagnesemia/hypophosphatemia Oral and IV replacement given. Continue to monitor and replete accordingly   IBS-D At home on Bentyl, Imodium   Lower extremity edema Echo from 2023 showing EF 60% and Grade 1 Diastolic dysfunction. No evidence of fluid overload.  At home on Lasix 20 g daily Currently on hold   Chronic UTI No active complaints. UA relatively unremarkable.  Hold home Methenamine   Anxiety continue home Xanax PRN.    HLD Statin consulted  Severe debility with falls -Once stable consulting PT OT for evaluation likely will need home health versus placement   ----------------------------------------------------------------------------------------------------------------------------------------------- Nutritional status:  The patient's BMI is: Body mass index is 26.94 kg/m. I agree with the assessment and plan as outlined  --------------------------------------------------------------------------------------------------------------------------- Cultures; Blood Cultures x 2 >> NGT Urine Culture  >>> NGT  Sputum Culture >> NGT   Antibiotics: Broad-spectrum antibiotics vancomycin/cefepime/Flagyl  ------------------------------------------------------------------------------------------------------------------------------------------------  DVT prophylaxis:  SCDs Start: 07/23/23 1717   Code Status:   Code Status: Limited: Do not attempt resuscitation (DNR) -DNR-LIMITED -Do Not Intubate/DNI   Family Communication: Son-in-law present at bedside-updated -Advance care planning has been discussed.   Admission status:   Status is: Inpatient Remains inpatient appropriate because: Needing close monitoring in the stepdown/ICU setting, for pancytopenia, sepsis, needing  antibiotics   Disposition: From  - home             Planning for discharge in likely Friday 2/28 home with home health versus SNF  Procedures:   No admission procedures for hospital encounter.   Antimicrobials:  Anti-infectives (From admission, onward)    Start     Dose/Rate Route Frequency Ordered Stop   07/25/23 1900  metroNIDAZOLE (FLAGYL) IVPB 500 mg        500 mg 100 mL/hr over 60 Minutes Intravenous Every 12 hours 07/25/23 1559     07/25/23 1900  ceFEPIme (MAXIPIME) 2 g in sodium chloride 0.9 % 100 mL IVPB        2 g 200 mL/hr over 30 Minutes Intravenous Every 12 hours 07/25/23 1619     07/25/23 1700  ceFEPIme (MAXIPIME) 2 g in sodium chloride 0.9 % 100 mL IVPB  Status:  Discontinued        2 g 200 mL/hr over 30 Minutes Intravenous Every 12 hours 07/25/23 1554 07/25/23 1619   07/25/23 1200  vancomycin (VANCOREADY) IVPB 750 mg/150 mL        750 mg 150 mL/hr over 60 Minutes Intravenous Every 24 hours 07/24/23 1121     07/24/23 1015  cefTRIAXone (ROCEPHIN) 2 g in sodium chloride 0.9 % 100 mL IVPB  Status:  Discontinued        2 g 200 mL/hr over 30 Minutes Intravenous Every 24 hours  07/24/23 0918 07/25/23 1554   07/24/23 1015  vancomycin (VANCOREADY) IVPB 1500 mg/300 mL        1,500 mg 150 mL/hr over 120 Minutes Intravenous  Once 07/24/23 0918 07/24/23 1305        Medication:   sodium chloride   Intravenous Once   acidophilus  2 capsule Oral TID WC   ALPRAZolam  0.5 mg Oral QHS   atorvastatin  20 mg Oral Daily   calcium carbonate  1 tablet Oral Daily   Chlorhexidine Gluconate Cloth  6 each Topical Daily   hydrocortisone sod succinate (SOLU-CORTEF) inj  100 mg Intravenous Q8H   midodrine  10 mg Oral TID WC   pantoprazole (PROTONIX) IV  40 mg Intravenous Q12H    acetaminophen **OR** acetaminophen, alum & mag hydroxide-simeth, dicyclomine, loperamide, methocarbamol (ROBAXIN) injection, ondansetron **OR** ondansetron (ZOFRAN) IV, mouth rinse   Objective:   Vitals:    07/26/23 0600 07/26/23 0700 07/26/23 0753 07/26/23 0808  BP: (!) 96/54 (!) 86/52    Pulse: 85 80 94   Resp: (!) 25 (!) 22 (!) 24   Temp:    97.9 F (36.6 C)  TempSrc:    Oral  SpO2: 98% 97% 95%   Weight:      Height:        Intake/Output Summary (Last 24 hours) at 07/26/2023 1000 Last data filed at 07/26/2023 1308 Gross per 24 hour  Intake 2065.85 ml  Output 900 ml  Net 1165.85 ml   Filed Weights   07/23/23 0810 07/24/23 1144 07/26/23 0508  Weight: 68.8 kg 70.9 kg 71.2 kg     Physical examination:    General:  Lethargic, but easily arousable AAO x 3,  cooperative, no distress;   HEENT:  Normocephalic, PERRL, otherwise with in Normal limits   Neuro:  CNII-XII intact. , normal motor and sensation, reflexes intact   Lungs:   Clear to auscultation BL, Respirations unlabored,  No wheezes / crackles  Cardio:    S1/S2, RRR, No murmure, No Rubs or Gallops   Abdomen:  Soft, non-tender, bowel sounds active all four quadrants, no guarding or peritoneal signs.  Muscular  skeletal:  Limited exam -global generalized weaknesses - in bed, able to move all 4 extremities,   2+ pulses,  symmetric, No pitting edema  Skin:  Dry, warm to touch, negative for any Rashes,  Wounds: Please see nursing documentation           ------------------------------------------------------------------------------------------------------------------------------------------    LABs:     Latest Ref Rng & Units 07/26/2023    3:38 AM 07/25/2023    7:45 PM 07/25/2023   11:44 AM  CBC  WBC 4.0 - 10.5 K/uL 1.8     Hemoglobin 12.0 - 15.0 g/dL 9.6  8.9  7.4   Hematocrit 36.0 - 46.0 % 28.5  27.4  23.4   Platelets 150 - 400 K/uL 29         Latest Ref Rng & Units 07/26/2023    3:38 AM 07/25/2023    5:10 AM 07/24/2023    9:27 AM  CMP  Glucose 70 - 99 mg/dL 657  846    BUN 8 - 23 mg/dL 17  11    Creatinine 9.62 - 1.00 mg/dL 9.52  8.41    Sodium 324 - 145 mmol/L 132  132    Potassium 3.5 - 5.1 mmol/L  4.1  3.8    Chloride 98 - 111 mmol/L 106  103    CO2 22 -  32 mmol/L 17  20    Calcium 8.9 - 10.3 mg/dL 6.6  7.0    Total Bilirubin 0.0 - 1.2 mg/dL   1.2        Micro Results Recent Results (from the past 240 hours)  Resp panel by RT-PCR (RSV, Flu A&B, Covid) Anterior Nasal Swab     Status: None   Collection Time: 07/23/23 12:02 PM   Specimen: Anterior Nasal Swab  Result Value Ref Range Status   SARS Coronavirus 2 by RT PCR NEGATIVE NEGATIVE Final    Comment: (NOTE) SARS-CoV-2 target nucleic acids are NOT DETECTED.  The SARS-CoV-2 RNA is generally detectable in upper respiratory specimens during the acute phase of infection. The lowest concentration of SARS-CoV-2 viral copies this assay can detect is 138 copies/mL. A negative result does not preclude SARS-Cov-2 infection and should not be used as the sole basis for treatment or other patient management decisions. A negative result may occur with  improper specimen collection/handling, submission of specimen other than nasopharyngeal swab, presence of viral mutation(s) within the areas targeted by this assay, and inadequate number of viral copies(<138 copies/mL). A negative result must be combined with clinical observations, patient history, and epidemiological information. The expected result is Negative.  Fact Sheet for Patients:  BloggerCourse.com  Fact Sheet for Healthcare Providers:  SeriousBroker.it  This test is no t yet approved or cleared by the [place] FDA and  has been authorized for detection and/or diagnosis of SARS-CoV-2 by FDA under an Emergency Use Authorization (EUA). This EUA will remain  in effect (meaning this test can be used) for the duration of the COVID-19 declaration under Section 564(b)(1) of the Act, 21 U.S.C.section 360bbb-3(b)(1), unless the authorization is terminated  or revoked sooner.       Influenza A by PCR NEGATIVE NEGATIVE Final    Influenza B by PCR NEGATIVE NEGATIVE Final    Comment: (NOTE) The Xpert Xpress SARS-CoV-2/FLU/RSV plus assay is intended as an aid in the diagnosis of influenza from Nasopharyngeal swab specimens and should not be used as a sole basis for treatment. Nasal washings and aspirates are unacceptable for Xpert Xpress SARS-CoV-2/FLU/RSV testing.  Fact Sheet for Patients: BloggerCourse.com  Fact Sheet for Healthcare Providers: SeriousBroker.it  This test is not yet approved or cleared by the [place] FDA and has been authorized for detection and/or diagnosis of SARS-CoV-2 by FDA under an Emergency Use Authorization (EUA). This EUA will remain in effect (meaning this test can be used) for the duration of the COVID-19 declaration under Section 564(b)(1) of the Act, 21 U.S.C. section 360bbb-3(b)(1), unless the authorization is terminated or revoked.     Resp Syncytial Virus by PCR NEGATIVE NEGATIVE Final    Comment: (NOTE) Fact Sheet for Patients: BloggerCourse.com  Fact Sheet for Healthcare Providers: SeriousBroker.it  This test is not yet approved or cleared by the [place] FDA and has been authorized for detection and/or diagnosis of SARS-CoV-2 by FDA under an Emergency Use Authorization (EUA). This EUA will remain in effect (meaning this test can be used) for the duration of the COVID-19 declaration under Section 564(b)(1) of the Act, 21 U.S.C. section 360bbb-3(b)(1), unless the authorization is terminated or revoked.  Performed at [facility], [address], [place], [place] [address]   Culture, blood (Routine X 2) w Reflex to ID Panel     Status: None (Preliminary result)   Collection Time: 07/24/23  9:27 AM   Specimen: BLOOD  Result Value Ref Range Status  Specimen Description BLOOD BLOOD LEFT HAND  Final   Special Requests   Final    BOTTLES DRAWN AEROBIC AND  ANAEROBIC Blood Culture adequate volume   Culture   Final    NO GROWTH 2 DAYS Performed at [facility], [address] [place], [place] [address]    Report Status PENDING  Incomplete  Culture, blood (Routine X 2) w Reflex to ID Panel     Status: None (Preliminary result)   Collection Time: 07/24/23  9:27 AM   Specimen: BLOOD  Result Value Ref Range Status   Specimen Description BLOOD BLOOD RIGHT HAND  Final   Special Requests   Final    BOTTLES DRAWN AEROBIC AND ANAEROBIC Blood Culture adequate volume   Culture   Final    NO GROWTH 2 DAYS Performed at [facility], [address] [place], [place] [address]    Report Status PENDING  Incomplete  MRSA Next Gen by PCR, Nasal     Status: None   Collection Time: 07/24/23 11:45 AM   Specimen: Nasal Mucosa; Nasal Swab  Result Value Ref Range Status   MRSA by PCR Next Gen NOT DETECTED NOT DETECTED Final    Comment: (NOTE) The GeneXpert MRSA Assay (FDA approved for NASAL specimens only), is one component of a comprehensive MRSA colonization surveillance program. It is not intended to diagnose MRSA infection nor to guide or monitor treatment for MRSA infections. Test performance is not FDA approved in patients less than 46 years old. Performed at [facility], [address] [place], [place] [address]   Culture, blood (Routine X 2) w Reflex to ID Panel     Status: None (Preliminary result)   Collection Time: 07/25/23 12:31 PM   Specimen: BLOOD  Result Value Ref Range Status   Specimen Description BLOOD RIGHT ANTECUBITAL  Final   Special Requests   Final    BOTTLES DRAWN AEROBIC AND ANAEROBIC Blood Culture adequate volume   Culture   Final    NO GROWTH < 24 HOURS Performed at [facility], [address], [place], [place] [address]    Report Status PENDING  Incomplete  Culture, blood (Routine X 2) w Reflex to ID Panel     Status: None (Preliminary result)   Collection Time: 07/25/23 12:37 PM   Specimen: BLOOD  Result Value Ref Range  Status   Specimen Description BLOOD LEFT ANTECUBITAL  Final   Special Requests   Final    BOTTLES DRAWN AEROBIC AND ANAEROBIC Blood Culture adequate volume   Culture   Final    NO GROWTH < 24 HOURS Performed at [facility], [address] [place], [place] [address]    Report Status PENDING  Incomplete    Radiology Reports [place] EKG SITE RITE Result Date: 07/25/2023 If Site Rite image not attached, placement could not be confirmed due to current cardiac rhythm.   SIGNED: Kendell Bane, MD, FHM. FAAFP. [facility] - [facility] Critical care  time spent 55 minutes.  In seeing, evaluating and examining the patient. Reviewing medical records, labs, drawn plan of care. [facility],  Pager (please use [email or web address] to page/ text) Please use Epic Secure Chat for non-urgent communication (7AM-7PM)  If 7PM-7AM, please contact night-coverage [email or web address], 07/26/2023, 10:00 AM

## 2023-07-27 ENCOUNTER — Ambulatory Visit (HOSPITAL_COMMUNITY)
Admission: RE | Admit: 2023-07-27 | Discharge: 2023-07-27 | Disposition: A | Discharge status: Home / Self Care | Payer: PPO | Source: Ambulatory Visit | Attending: Student | Admitting: Student

## 2023-07-27 ENCOUNTER — Encounter (HOSPITAL_COMMUNITY): Payer: Self-pay

## 2023-07-27 ENCOUNTER — Ambulatory Visit (HOSPITAL_COMMUNITY): Payer: PPO

## 2023-07-27 ENCOUNTER — Encounter (HOSPITAL_COMMUNITY): Payer: Self-pay | Admitting: Family Medicine

## 2023-07-27 DIAGNOSIS — D61818 Other pancytopenia: Secondary | ICD-10-CM | POA: Diagnosis not present

## 2023-07-27 DIAGNOSIS — K922 Gastrointestinal hemorrhage, unspecified: Secondary | ICD-10-CM | POA: Diagnosis not present

## 2023-07-27 DIAGNOSIS — C9200 Acute myeloblastic leukemia, not having achieved remission: Secondary | ICD-10-CM | POA: Diagnosis not present

## 2023-07-27 DIAGNOSIS — A419 Sepsis, unspecified organism: Secondary | ICD-10-CM | POA: Diagnosis not present

## 2023-07-27 DIAGNOSIS — R6521 Severe sepsis with septic shock: Secondary | ICD-10-CM | POA: Diagnosis not present

## 2023-07-27 HISTORY — PX: IR BONE MARROW BIOPSY: IMG5733

## 2023-07-27 LAB — BPAM PLATELET PHERESIS
Blood Product Expiration Date: 202503012359
Blood Product Expiration Date: 202503012359
ISSUE DATE / TIME: 202502271224
ISSUE DATE / TIME: 202502271356
Unit Type and Rh: 5100
Unit Type and Rh: 6200

## 2023-07-27 LAB — CBC WITH DIFFERENTIAL/PLATELET
Abs Immature Granulocytes: 0 10*3/uL (ref 0.00–0.07)
Band Neutrophils: 5 %
Basophils Absolute: 0 10*3/uL (ref 0.0–0.1)
Basophils Relative: 0 %
Eosinophils Absolute: 0 10*3/uL (ref 0.0–0.5)
Eosinophils Relative: 0 %
HCT: 28.1 % — ABNORMAL LOW (ref 36.0–46.0)
Hemoglobin: 9.4 g/dL — ABNORMAL LOW (ref 12.0–15.0)
Lymphocytes Relative: 22 %
Lymphs Abs: 0.3 10*3/uL — ABNORMAL LOW (ref 0.7–4.0)
MCH: 30.1 pg (ref 26.0–34.0)
MCHC: 33.5 g/dL (ref 30.0–36.0)
MCV: 90.1 fL (ref 80.0–100.0)
Monocytes Absolute: 0.1 10*3/uL (ref 0.1–1.0)
Monocytes Relative: 6 %
Neutro Abs: 1.1 10*3/uL — ABNORMAL LOW (ref 1.7–7.7)
Neutrophils Relative %: 67 %
Platelets: 136 10*3/uL — ABNORMAL LOW (ref 150–400)
RBC: 3.12 MIL/uL — ABNORMAL LOW (ref 3.87–5.11)
RDW: 18.4 % — ABNORMAL HIGH (ref 11.5–15.5)
Smear Review: ADEQUATE
WBC: 1.5 10*3/uL — ABNORMAL LOW (ref 4.0–10.5)
nRBC: 0 % (ref 0.0–0.2)

## 2023-07-27 LAB — PREPARE PLATELET PHERESIS
Unit division: 0
Unit division: 0

## 2023-07-27 LAB — MAGNESIUM: Magnesium: 2.1 mg/dL (ref 1.7–2.4)

## 2023-07-27 LAB — HEMOGLOBIN AND HEMATOCRIT, BLOOD
HCT: 27.1 % — ABNORMAL LOW (ref 36.0–46.0)
Hemoglobin: 9 g/dL — ABNORMAL LOW (ref 12.0–15.0)

## 2023-07-27 LAB — BASIC METABOLIC PANEL
Anion gap: 8 (ref 5–15)
BUN: 25 mg/dL — ABNORMAL HIGH (ref 8–23)
CO2: 20 mmol/L — ABNORMAL LOW (ref 22–32)
Calcium: 6.7 mg/dL — ABNORMAL LOW (ref 8.9–10.3)
Chloride: 107 mmol/L (ref 98–111)
Creatinine, Ser: 0.88 mg/dL (ref 0.44–1.00)
GFR, Estimated: >60 mL/min (ref >60)
Glucose, Bld: 146 mg/dL — ABNORMAL HIGH (ref 70–99)
Potassium: 4 mmol/L (ref 3.5–5.1)
Sodium: 135 mmol/L (ref 135–145)

## 2023-07-27 LAB — ALBUMIN: Albumin: 2.1 g/dL — ABNORMAL LOW (ref 3.5–5.0)

## 2023-07-27 MED ORDER — POTASSIUM & SODIUM PHOSPHATES 280-160-250 MG PO PACK
1.0000 | PACK | Freq: Three times a day (TID) | ORAL | Status: DC
  Administered 2023-07-27 – 2023-08-01 (×18): 1 via ORAL
  Filled 2023-07-27 (×2): qty 1
  Filled 2023-07-27: qty 3
  Filled 2023-07-27 (×9): qty 1
  Filled 2023-07-27: qty 3
  Filled 2023-07-27 (×4): qty 1

## 2023-07-27 MED ORDER — LIDOCAINE HCL 1 % IJ SOLN
INTRAMUSCULAR | Status: AC
  Filled 2023-07-27: qty 20

## 2023-07-27 MED ORDER — DOCUSATE SODIUM 100 MG PO CAPS
100.0000 mg | ORAL_CAPSULE | Freq: Every day | ORAL | Status: DC | PRN
  Administered 2023-07-28: 100 mg via ORAL
  Filled 2023-07-27: qty 1

## 2023-07-27 MED ORDER — MAGNESIUM SULFATE 2 GM/50ML IV SOLN
2.0000 g | Freq: Once | INTRAVENOUS | Status: DC
Start: 2023-07-27 — End: 2023-07-27

## 2023-07-27 MED ORDER — MIDAZOLAM HCL 2 MG/2ML IJ SOLN
INTRAMUSCULAR | Status: AC | PRN
Start: 2023-07-27 — End: 2023-07-27
  Administered 2023-07-27: .5 mg via INTRAVENOUS

## 2023-07-27 MED ORDER — CALCIUM GLUCONATE-NACL 1-0.675 GM/50ML-% IV SOLN
1.0000 g | INTRAVENOUS | Status: DC
  Administered 2023-07-27: 1000 mg via INTRAVENOUS

## 2023-07-27 MED ORDER — FENTANYL CITRATE (PF) 100 MCG/2ML IJ SOLN
INTRAMUSCULAR | Status: AC
  Filled 2023-07-27: qty 2

## 2023-07-27 MED ORDER — MIDAZOLAM HCL 2 MG/2ML IJ SOLN
INTRAMUSCULAR | Status: AC
  Filled 2023-07-27: qty 2

## 2023-07-27 MED ORDER — LIDOCAINE HCL (PF) 1 % IJ SOLN
INTRAMUSCULAR | Status: AC
  Filled 2023-07-27: qty 30

## 2023-07-27 MED ORDER — FENTANYL CITRATE (PF) 100 MCG/2ML IJ SOLN
INTRAMUSCULAR | Status: AC | PRN
  Administered 2023-07-27: 25 ug via INTRAVENOUS

## 2023-07-27 MED ORDER — MAGNESIUM SULFATE 2 GM/50ML IV SOLN
2.0000 g | Freq: Once | INTRAVENOUS | Status: AC
  Administered 2023-07-27: 2 g via INTRAVENOUS
  Filled 2023-07-27: qty 50

## 2023-07-27 MED ORDER — CALCIUM GLUCONATE-NACL 1-0.675 GM/50ML-% IV SOLN
1.0000 g | INTRAVENOUS | Status: AC
  Administered 2023-07-27: 1000 mg via INTRAVENOUS
  Filled 2023-07-27: qty 50

## 2023-07-27 NOTE — Progress Notes (Signed)
 PROGRESS NOTE    Patient: Stephanie Wood                            PCP: Billie Lade, MD                    DOB: 12-Jun-1934            DOA: 07/23/2023 [id number]             DOS: 07/27/2023, 12:29 PM   LOS: 4 days   Date of Service: The patient was seen and examined on 07/27/2023  Subjective:   The patient was seen and examined this morning, more awake alert, hemodynamically stable Planning to transfer to [facility] for bone marrow biopsy  Patient is confirmed DNR limited (DO NOT INTUBATE)   Brief Narrative:   MYRKA SYLVA is a 89 y.o. 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 Patient lives at home and able to take care of her ADLs. 2/11, seen by GI as an outpatient with concern of black stools, poor appetite. Elective EGD was planned for March. Patient continued to have black stool leading to progressive weakness.   2/24, patient felt dizzy, unsteady and had a fall and hence brought to the ED by EMS from home.   ED:  Tmax. 101.2, heart rate in 90s, blood pressure 128/58, breathing on 2 L oxygen by nasal cannula. Labs with WBC count 1.3, hemoglobin 9.4, platelets 50, sodium 134, potassium 3, magnesium 1.4, phosphorus 1.5. Urinalysis unremarkable Fecal occult blood positive CT abdomen pelvis with contrast showed chronic moderate to large gastric hiatal hernia, spleen normal CT head showed mild chronic small vessel ischemic changes, generalized parenchymal atrophy. GI was consulted   Assessment and plan:   Sepsis - POA  Septic shock with hypotension -07/27/2023-hemodynamically stable today, bone marrow biopsy scheduled for today -On 07/25/2023 patient met sepsis criteria with, tachypnea, Tmax 103.1, tachycardia -IV fluid was initiated, with 2U PRBC blood transfusion -Broaden antibiotics to vancomycin, cefepime and Flagyl -No source of infection identified  yet -UA, chest x-ray reviewed -Lactic acid 1.1, 1.2, WBC 1.8, = For hypotension, started on midodrine orally = Levophed gtt ordered  -PICC line was placed 12/23/2023  -Continue current antibiotics, following cultures  New onset anemia -Heme positive stool -Presented with progressive weakness, drop in hemoglobin. Baseline hemoglobin 12.3 from September 2024, hemoglobin on admission 9.4.  Also has low WBC count and low platelet count FOBT positive but no overtly bleeding. GI consulted.  EGD/colonoscopy -pending due to thrombocytopenia Continue IV Protonix 40 mg twice daily    Latest Ref Rng & Units 07/27/2023    2:51 AM 07/26/2023    7:05 PM 07/26/2023   11:31 AM  CBC  WBC 4.0 - 10.5 K/uL 1.5     Hemoglobin 12.0 - 15.0 g/dL 9.4  9.0  9.6   Hematocrit 36.0 - 46.0 % 28.1  26.7  28.5   Platelets 150 - 400 K/uL 136      07/25/2023-received 2U PRBC blood transfusion (Discussed with her son-in-law, daughter and patient at bedside)   Pancytopenia All cell lines are depressed. Last blood work from September 2024 with normal WBC count, hemoglobin and platelet only mildly low at 117.   Unclear etiology -sepsis related versus possible underlying malignancy  -Blood cultures remain negative -Discussed with Dr. Alto Denver with pursuing bone marrow biopsy today 07/27/2023  Acute leukemia with acute reaction is on differential diagnosis  Patient is to follow-up as an outpatient    Iron/TIBC/Ferritin/ %Sat    Component Value Date/Time   IRON 19 (L) 07/25/2023 0510   TIBC 152 (L) 07/25/2023 0510   FERRITIN 704 (H) 07/25/2023 0510   IRONPCTSAT 13 07/25/2023 0510  Folate 14.4, B12 893,  Initiating oral and IM/ IV Iron supplements   Thrombocytopenia -Due to presumed GI bleed, anemia -received 2U PRBC -Received 2 units of platelets on 07/26/2023 -Platelets: 35, 30, 29>>> 136 -Will transfuse 2U of platelets today 07/26/2023    Hypokalemia/hypomagnesemia/hypophosphatemia Oral and  IV replacement given. Continue to monitor and replete accordingly   IBS-D At home on Bentyl, Imodium   Lower extremity edema Echo from 2023 showing EF 60% and Grade 1 Diastolic dysfunction. No evidence of fluid overload.  At home on Lasix 20 g daily (20 mg IV was given post blood transfusion on 2/27)  Currently on hold   Chronic UTI No active complaints. UA relatively unremarkable.  Hold home Methenamine   Anxiety continue home Xanax PRN.    HLD Statin consulted  Severe debility with falls -Once stable consulting PT OT for evaluation likely will need home health versus placement   ----------------------------------------------------------------------------------------------------------------------------------------------- Nutritional status:  The patient's BMI is: Body mass index is 26.94 kg/m. I agree with the assessment and plan as outlined  --------------------------------------------------------------------------------------------------------------------------- Cultures; Blood Cultures x 2 >> NGT Urine Culture  >>> NGT  Sputum Culture >> NGT   Antibiotics: Broad-spectrum antibiotics vancomycin/cefepime/Flagyl  ------------------------------------------------------------------------------------------------------------------------------------------------  DVT prophylaxis:  Place and maintain sequential compression device Start: 07/27/23 0748 SCDs Start: 07/23/23 1717   Code Status:   Code Status: Limited: Do not attempt resuscitation (DNR) -DNR-LIMITED -Do Not Intubate/DNI   Family Communication: Son-in-law present at bedside-updated -Advance care planning has been discussed.   Admission status:   Status is: Inpatient Remains inpatient appropriate because: Needing close monitoring in the stepdown/ICU setting, for pancytopenia, sepsis, needing antibiotics   Disposition: From  - home             Planning for discharge in likely Friday 2/28 home with home  health versus SNF  Procedures:   No admission procedures for hospital encounter.   Antimicrobials:  Anti-infectives (From admission, onward)    Start     Dose/Rate Route Frequency Ordered Stop   07/26/23 1600  vancomycin (VANCOREADY) IVPB 750 mg/150 mL        750 mg 150 mL/hr over 60 Minutes Intravenous Every 24 hours 07/26/23 1434     07/25/23 1900  metroNIDAZOLE (FLAGYL) IVPB 500 mg        500 mg 100 mL/hr over 60 Minutes Intravenous Every 12 hours 07/25/23 1559     07/25/23 1900  ceFEPIme (MAXIPIME) 2 g in sodium chloride 0.9 % 100 mL IVPB        2 g 200 mL/hr over 30 Minutes Intravenous Every 12 hours 07/25/23 1619     07/25/23 1700  ceFEPIme (MAXIPIME) 2 g in sodium chloride 0.9 % 100 mL IVPB  Status:  Discontinued        2 g 200 mL/hr over 30 Minutes Intravenous Every 12 hours 07/25/23 1554 07/25/23 1619   07/25/23 1200  vancomycin (VANCOREADY) IVPB 750 mg/150 mL  Status:  Discontinued        750 mg 150 mL/hr over 60 Minutes Intravenous Every 24 hours 07/24/23 1121 07/26/23 1434   07/24/23 1015  cefTRIAXone (ROCEPHIN) 2 g in sodium chloride 0.9 %  100 mL IVPB  Status:  Discontinued        2 g 200 mL/hr over 30 Minutes Intravenous Every 24 hours 07/24/23 0918 07/25/23 1554   07/24/23 1015  vancomycin (VANCOREADY) IVPB 1500 mg/300 mL        1,500 mg 150 mL/hr over 120 Minutes Intravenous  Once 07/24/23 0918 07/24/23 1305        Medication:   acidophilus  2 capsule Oral TID WC   ALPRAZolam  0.5 mg Oral QHS   atorvastatin  20 mg Oral Daily   calcium carbonate  1 tablet Oral Daily   Chlorhexidine Gluconate Cloth  6 each Topical Daily   hydrocortisone sod succinate (SOLU-CORTEF) inj  100 mg Intravenous Q8H   midodrine  10 mg Oral TID WC   pantoprazole (PROTONIX) IV  40 mg Intravenous Q12H   potassium & sodium phosphates  1 packet Oral TID WC & HS   sodium chloride flush  10-40 mL Intracatheter Q12H    acetaminophen **OR** acetaminophen, alum & mag hydroxide-simeth,  dicyclomine, loperamide, methocarbamol (ROBAXIN) injection, ondansetron **OR** ondansetron (ZOFRAN) IV, mouth rinse, sodium chloride flush   Objective:   Vitals:   07/27/23 0700 07/27/23 0800 07/27/23 0811 07/27/23 0950  BP: 138/81 100/66  137/75  Pulse: 89   93  Resp: (!) 25 18  16   Temp:   (!) 96.1 F (35.6 C)   TempSrc:   Axillary   SpO2: 94%   92%  Weight:      Height:        Intake/Output Summary (Last 24 hours) at 07/27/2023 1229 Last data filed at 07/27/2023 0800 Gross per 24 hour  Intake 940.36 ml  Output 400 ml  Net 540.36 ml   Filed Weights   07/23/23 0810 07/24/23 1144 07/26/23 0508  Weight: 68.8 kg 70.9 kg 71.2 kg     Physical examination:        General:  AAO x 3,  cooperative, no distress;   HEENT:  Normocephalic, PERRL, otherwise with in Normal limits   Neuro:  CNII-XII intact. , normal motor and sensation, reflexes intact   Lungs:   Clear to auscultation BL, Respirations unlabored,  No wheezes / crackles  Cardio:    S1/S2, RRR, No murmure, No Rubs or Gallops   Abdomen:  Soft, non-tender, bowel sounds active all four quadrants, no guarding or peritoneal signs.  Muscular  skeletal:  Limited exam -global generalized weaknesses - in bed, able to move all 4 extremities,   2+ pulses,  symmetric, No pitting edema  Skin:  Dry, warm to touch, negative for any Rashes,  Wounds: Please see nursing documentation          ------------------------------------------------------------------------------------------------------------------------------------------    LABs:     Latest Ref Rng & Units 07/27/2023    2:51 AM 07/26/2023    7:05 PM 07/26/2023   11:31 AM  CBC  WBC 4.0 - 10.5 K/uL 1.5     Hemoglobin 12.0 - 15.0 g/dL 9.4  9.0  9.6   Hematocrit 36.0 - 46.0 % 28.1  26.7  28.5   Platelets 150 - 400 K/uL 136         Latest Ref Rng & Units 07/27/2023    2:51 AM 07/26/2023    3:38 AM 07/25/2023    5:10 AM  CMP  Glucose 70 - 99 mg/dL 865  784  696    BUN 8 - 23 mg/dL 25  17  11    Creatinine 0.44 - 1.00 mg/dL  0.88  0.95  1.00   Sodium 135 - 145 mmol/L 135  132  132   Potassium 3.5 - 5.1 mmol/L 4.0  4.1  3.8   Chloride 98 - 111 mmol/L 107  106  103   CO2 22 - 32 mmol/L 20  17  20    Calcium 8.9 - 10.3 mg/dL 6.7  6.6  7.0        Micro Results Recent Results (from the past 240 hours)  Resp panel by RT-PCR (RSV, Flu A&B, Covid) Anterior Nasal Swab     Status: None   Collection Time: 07/23/23 12:02 PM   Specimen: Anterior Nasal Swab  Result Value Ref Range Status   SARS Coronavirus 2 by RT PCR NEGATIVE NEGATIVE Final    Comment: (NOTE) SARS-CoV-2 target nucleic acids are NOT DETECTED.  The SARS-CoV-2 RNA is generally detectable in upper respiratory specimens during the acute phase of infection. The lowest concentration of SARS-CoV-2 viral copies this assay can detect is 138 copies/mL. A negative result does not preclude SARS-Cov-2 infection and should not be used as the sole basis for treatment or other patient management decisions. A negative result may occur with  improper specimen collection/handling, submission of specimen other than nasopharyngeal swab, presence of viral mutation(s) within the areas targeted by this assay, and inadequate number of viral copies(<138 copies/mL). A negative result must be combined with clinical observations, patient history, and epidemiological information. The expected result is Negative.  Fact Sheet for Patients:  BloggerCourse.com  Fact Sheet for Healthcare Providers:  SeriousBroker.it  This test is no t yet approved or cleared by the [place] FDA and  has been authorized for detection and/or diagnosis of SARS-CoV-2 by FDA under an Emergency Use Authorization (EUA). This EUA will remain  in effect (meaning this test can be used) for the duration of the COVID-19 declaration under Section 564(b)(1) of the Act, 21 U.S.C.section  360bbb-3(b)(1), unless the authorization is terminated  or revoked sooner.       Influenza A by PCR NEGATIVE NEGATIVE Final   Influenza B by PCR NEGATIVE NEGATIVE Final    Comment: (NOTE) The Xpert Xpress SARS-CoV-2/FLU/RSV plus assay is intended as an aid in the diagnosis of influenza from Nasopharyngeal swab specimens and should not be used as a sole basis for treatment. Nasal washings and aspirates are unacceptable for Xpert Xpress SARS-CoV-2/FLU/RSV testing.  Fact Sheet for Patients: BloggerCourse.com  Fact Sheet for Healthcare Providers: SeriousBroker.it  This test is not yet approved or cleared by the [place] FDA and has been authorized for detection and/or diagnosis of SARS-CoV-2 by FDA under an Emergency Use Authorization (EUA). This EUA will remain in effect (meaning this test can be used) for the duration of the COVID-19 declaration under Section 564(b)(1) of the Act, 21 U.S.C. section 360bbb-3(b)(1), unless the authorization is terminated or revoked.     Resp Syncytial Virus by PCR NEGATIVE NEGATIVE Final    Comment: (NOTE) Fact Sheet for Patients: BloggerCourse.com  Fact Sheet for Healthcare Providers: SeriousBroker.it  This test is not yet approved or cleared by the [place] FDA and has been authorized for detection and/or diagnosis of SARS-CoV-2 by FDA under an Emergency Use Authorization (EUA). This EUA will remain in effect (meaning this test can be used) for the duration of the COVID-19 declaration under Section 564(b)(1) of the Act, 21 U.S.C. section 360bbb-3(b)(1), unless the authorization is terminated or revoked.  Performed at [facility], [address] [place], [place] [address]   Culture,  blood (Routine X 2) w Reflex to ID Panel     Status: None (Preliminary result)   Collection Time: 07/24/23  9:27 AM   Specimen: BLOOD  Result Value  Ref Range Status   Specimen Description BLOOD BLOOD LEFT HAND  Final   Special Requests   Final    BOTTLES DRAWN AEROBIC AND ANAEROBIC Blood Culture adequate volume   Culture   Final    NO GROWTH 3 DAYS Performed at [facility], [address], [place], [place] [address]    Report Status PENDING  Incomplete  Culture, blood (Routine X 2) w Reflex to ID Panel     Status: None (Preliminary result)   Collection Time: 07/24/23  9:27 AM   Specimen: BLOOD  Result Value Ref Range Status   Specimen Description BLOOD BLOOD RIGHT HAND  Final   Special Requests   Final    BOTTLES DRAWN AEROBIC AND ANAEROBIC Blood Culture adequate volume   Culture   Final    NO GROWTH 3 DAYS Performed at [facility], [address] [place], [place] [address]    Report Status PENDING  Incomplete  MRSA Next Gen by PCR, Nasal     Status: None   Collection Time: 07/24/23 11:45 AM   Specimen: Nasal Mucosa; Nasal Swab  Result Value Ref Range Status   MRSA by PCR Next Gen NOT DETECTED NOT DETECTED Final    Comment: (NOTE) The GeneXpert MRSA Assay (FDA approved for NASAL specimens only), is one component of a comprehensive MRSA colonization surveillance program. It is not intended to diagnose MRSA infection nor to guide or monitor treatment for MRSA infections. Test performance is not FDA approved in patients less than 57 years old. Performed at [facility], [address], [place], [place] [address]   Culture, blood (Routine X 2) w Reflex to ID Panel     Status: None (Preliminary result)   Collection Time: 07/25/23 12:31 PM   Specimen: BLOOD  Result Value Ref Range Status   Specimen Description BLOOD RIGHT ANTECUBITAL  Final   Special Requests   Final    BOTTLES DRAWN AEROBIC AND ANAEROBIC Blood Culture adequate volume   Culture   Final    NO GROWTH 2 DAYS Performed at [facility], [address], [place], [place] [address]    Report Status PENDING  Incomplete  Culture, blood (Routine X 2) w Reflex to ID  Panel     Status: None (Preliminary result)   Collection Time: 07/25/23 12:37 PM   Specimen: BLOOD  Result Value Ref Range Status   Specimen Description BLOOD LEFT ANTECUBITAL  Final   Special Requests   Final    BOTTLES DRAWN AEROBIC AND ANAEROBIC Blood Culture adequate volume   Culture   Final    NO GROWTH 2 DAYS Performed at [facility], [address] [place], [place] [address]    Report Status PENDING  Incomplete    Radiology Reports No results found.   SIGNED: Kendell Bane, MD, FHM. FAAFP. [facility] - [facility] Critical care  time spent 55 minutes.  In seeing, evaluating and examining the patient. Reviewing medical records, labs, drawn plan of care. [facility],  Pager (please use [email or web address] to page/ text) Please use Epic Secure Chat for non-urgent communication (7AM-7PM)  If 7PM-7AM, please contact night-coverage [email or web address], 07/27/2023, 12:29 PM

## 2023-07-27 NOTE — Plan of Care (Signed)

## 2023-07-27 NOTE — Procedures (Signed)
 Interventional Radiology Procedure:   Indications: Pancytopenia  Procedure: Image guided bone marrow biopsy  Findings: 2 aspirates and 3 cores from right ilium  Complications: None     EBL: Minimal, less than 10 ml  Plan: Return to [facility]   Cybill Uriegas R. Lowella Dandy, MD  Pager: [phone]

## 2023-07-27 NOTE — Care Management Important Message (Signed)
 Important Message  Patient Details  Name: Stephanie Wood MRN: [id number] Date of Birth: Nov 29, 1934   Important Message Given:  Yes - Medicare IM     Corey Harold 07/27/2023, 1:48 PM

## 2023-07-27 NOTE — Plan of Care (Signed)
  Problem: Clinical Measurements: Goal: Respiratory complications will improve Outcome: Progressing   Problem: Coping: Goal: Level of anxiety will decrease Outcome: Progressing   Problem: Safety: Goal: Ability to remain free from injury will improve Outcome: Progressing   Problem: Respiratory: Goal: Ability to maintain adequate ventilation will improve Outcome: Progressing

## 2023-07-28 DIAGNOSIS — K922 Gastrointestinal hemorrhage, unspecified: Secondary | ICD-10-CM | POA: Diagnosis not present

## 2023-07-28 DIAGNOSIS — A419 Sepsis, unspecified organism: Secondary | ICD-10-CM | POA: Diagnosis not present

## 2023-07-28 DIAGNOSIS — R6521 Severe sepsis with septic shock: Secondary | ICD-10-CM | POA: Diagnosis not present

## 2023-07-28 LAB — CBC WITH DIFFERENTIAL/PLATELET
Abs Immature Granulocytes: 0.11 10*3/uL — ABNORMAL HIGH (ref 0.00–0.07)
Basophils Absolute: 0 10*3/uL (ref 0.0–0.1)
Basophils Relative: 0 %
Eosinophils Absolute: 0 10*3/uL (ref 0.0–0.5)
Eosinophils Relative: 0 %
HCT: 28.3 % — ABNORMAL LOW (ref 36.0–46.0)
Hemoglobin: 9.5 g/dL — ABNORMAL LOW (ref 12.0–15.0)
Immature Granulocytes: 11 %
Lymphocytes Relative: 20 %
Lymphs Abs: 0.2 10*3/uL — ABNORMAL LOW (ref 0.7–4.0)
MCH: 30.1 pg (ref 26.0–34.0)
MCHC: 33.6 g/dL (ref 30.0–36.0)
MCV: 89.6 fL (ref 80.0–100.0)
Monocytes Absolute: 0.1 10*3/uL (ref 0.1–1.0)
Monocytes Relative: 8 %
Neutro Abs: 0.6 10*3/uL — ABNORMAL LOW (ref 1.7–7.7)
Neutrophils Relative %: 61 %
Platelets: 111 10*3/uL — ABNORMAL LOW (ref 150–400)
RBC: 3.16 MIL/uL — ABNORMAL LOW (ref 3.87–5.11)
RDW: 18.2 % — ABNORMAL HIGH (ref 11.5–15.5)
WBC Morphology: ABNORMAL
WBC: 1 10*3/uL — CL (ref 4.0–10.5)
nRBC: 0 % (ref 0.0–0.2)

## 2023-07-28 LAB — COMPREHENSIVE METABOLIC PANEL
ALT: 20 U/L (ref 0–44)
AST: 32 U/L (ref 15–41)
Albumin: 2.1 g/dL — ABNORMAL LOW (ref 3.5–5.0)
Alkaline Phosphatase: 67 U/L (ref 38–126)
Anion gap: 6 (ref 5–15)
BUN: 25 mg/dL — ABNORMAL HIGH (ref 8–23)
CO2: 20 mmol/L — ABNORMAL LOW (ref 22–32)
Calcium: 6.8 mg/dL — ABNORMAL LOW (ref 8.9–10.3)
Chloride: 110 mmol/L (ref 98–111)
Creatinine, Ser: 0.79 mg/dL (ref 0.44–1.00)
GFR, Estimated: >60 mL/min (ref >60)
Glucose, Bld: 145 mg/dL — ABNORMAL HIGH (ref 70–99)
Potassium: 3.8 mmol/L (ref 3.5–5.1)
Sodium: 136 mmol/L (ref 135–145)
Total Bilirubin: 1.2 mg/dL (ref 0.0–1.2)
Total Protein: 5.3 g/dL — ABNORMAL LOW (ref 6.5–8.1)

## 2023-07-28 LAB — MAGNESIUM: Magnesium: 2.2 mg/dL (ref 1.7–2.4)

## 2023-07-28 LAB — HEMOGLOBIN AND HEMATOCRIT, BLOOD
HCT: 31 % — ABNORMAL LOW (ref 36.0–46.0)
Hemoglobin: 10 g/dL — ABNORMAL LOW (ref 12.0–15.0)

## 2023-07-28 MED ORDER — HYDRALAZINE HCL 20 MG/ML IJ SOLN: 10.0000 mg | INTRAMUSCULAR | Status: DC | PRN

## 2023-07-28 MED ORDER — METRONIDAZOLE 500 MG PO TABS
500.0000 mg | ORAL_TABLET | Freq: Two times a day (BID) | ORAL | Status: DC
  Administered 2023-07-28 – 2023-07-29 (×3): 500 mg via ORAL
  Filled 2023-07-28 (×3): qty 1

## 2023-07-28 MED ORDER — VITAMIN B-12 1000 MCG PO TABS
1000.0000 ug | ORAL_TABLET | Freq: Every day | ORAL | Status: DC
  Administered 2023-07-28 – 2023-08-01 (×5): 1000 ug via ORAL
  Filled 2023-07-28 (×5): qty 1

## 2023-07-28 MED ORDER — METOPROLOL TARTRATE 25 MG PO TABS
12.5000 mg | ORAL_TABLET | Freq: Two times a day (BID) | ORAL | Status: DC
  Administered 2023-07-28 – 2023-07-29 (×3): 12.5 mg via ORAL
  Filled 2023-07-28 (×3): qty 1

## 2023-07-28 MED ORDER — CALCIUM GLUCONATE-NACL 1-0.675 GM/50ML-% IV SOLN
1.0000 g | INTRAVENOUS | Status: AC
Start: 2023-07-28 — End: 2023-07-28
  Administered 2023-07-28: 1000 mg via INTRAVENOUS
  Filled 2023-07-28: qty 50

## 2023-07-28 MED ORDER — ALBUMIN HUMAN 25 % IV SOLN
25.0000 g | Freq: Once | INTRAVENOUS | Status: AC
  Administered 2023-07-28: 25 g via INTRAVENOUS
  Filled 2023-07-28: qty 100
  Filled 2023-07-28: qty 500

## 2023-07-28 MED ORDER — METHOCARBAMOL 1000 MG/10ML IJ SOLN
500.0000 mg | Freq: Two times a day (BID) | INTRAMUSCULAR | Status: DC | PRN
  Administered 2023-07-30 (×2): 500 mg via INTRAVENOUS
  Filled 2023-07-28 (×2): qty 10

## 2023-07-28 NOTE — Plan of Care (Signed)

## 2023-07-28 NOTE — Progress Notes (Signed)
 Date and time results received: 07/28/23 0325   Test: CBC with Differential/platelet Critical Value: WBC-1.0  Name of Provider Notified: O. Adefeso

## 2023-07-28 NOTE — Progress Notes (Signed)
 PROGRESS NOTE    Patient: Stephanie Wood                            PCP: Billie Lade, MD                    DOB: 03/27/1935            DOA: 07/23/2023 ZOX:[id number]             DOS: 07/28/2023, 10:43 AM   LOS: 5 days   Date of Service: The patient was seen and examined on 07/28/2023  Subjective:   The patient was seen and examined this morning.  Lethargic but arousable, following commands Still tachycardic with heart rate 130s, respiratory rate of 33, blood pressure 163/95  Status post bone marrow biopsy on 07/27/2023-preliminary reading abnormal  Patient is confirmed DNR limited (DO NOT INTUBATE)   Brief Narrative:   FE OKUBO is a 89 y.o. 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 Patient lives at home and able to take care of her ADLs. 2/11, seen by GI as an outpatient with concern of black stools, poor appetite. Elective EGD was planned for March. Patient continued to have black stool leading to progressive weakness.   2/24, patient felt dizzy, unsteady and had a fall and hence brought to the ED by EMS from home.   ED:  Tmax. 101.2, heart rate in 90s, blood pressure 128/58, breathing on 2 L oxygen by nasal cannula. Labs with WBC count 1.3, hemoglobin 9.4, platelets 50, sodium 134, potassium 3, magnesium 1.4, phosphorus 1.5. Urinalysis unremarkable Fecal occult blood positive CT abdomen pelvis with contrast showed chronic moderate to large gastric hiatal hernia, spleen normal CT head showed mild chronic small vessel ischemic changes, generalized parenchymal atrophy. GI was consulted   Assessment and plan:   Sepsis - POA  Septic shock with hypotension -07/27/2023- S/P Bone marrow biopsy  -07/28/2023, or awake, hemodynamically stable afebrile, now hypertensive  -On 07/25/2023 patient met sepsis criteria with, tachypnea, Tmax 103.1, tachycardia -IV fluid was  initiated, with 2U PRBC blood transfusion -Broaden antibiotics to vancomycin, cefepime and Flagyl>>> discontinue vancomycin, changing Flagyl to p.o.  -No source of infection identified yet -UA, chest x-ray reviewed -Lactic acid 1.1, 1.2, WBC 1.8, = For hypotension, started on midodrine orally = Levophed gtt ordered  -PICC line was placed 12/23/2023 -Modify antibiotics discontinued Vanc.  New onset anemia -Heme positive stool -Presented with progressive weakness, drop in hemoglobin. Baseline hemoglobin 12.3 from September 2024, hemoglobin on admission 9.4.  Also has low WBC count and low platelet count FOBT positive but no overtly bleeding. GI was consulted.  EGD/colonoscopy -deferred due to thrombocytopenia Continue IV Protonix 40 mg twice daily    Latest Ref Rng & Units 07/28/2023    2:36 AM 07/27/2023    6:35 PM 07/27/2023    2:51 AM  CBC  WBC 4.0 - 10.5 K/uL 1.0   1.5   Hemoglobin 12.0 - 15.0 g/dL 9.5  9.0  9.4   Hematocrit 36.0 - 46.0 % 28.3  27.1  28.1   Platelets 150 - 400 K/uL 111   136    07/25/2023-received 2U PRBC blood transfusion (Discussed with her son-in-law, daughter and patient at bedside)   Pancytopenia All cell lines are depressed. Last blood work from September 2024 with normal WBC count, hemoglobin and platelet only  mildly low at 117.   Unclear etiology -sepsis related versus possible underlying malignancy -Received 2 units of platelets on 07/27/2023  -Blood cultures remain negative - -Discussed with Dr. Alto Denver with pursuing bone marrow biopsy 07/27/2023 Acute leukemia with acute reaction is on differential diagnosis  Patient is to follow-up as an outpatient    Iron/TIBC/Ferritin/ %Sat    Component Value Date/Time   IRON 19 (L) 07/25/2023 0510   TIBC 152 (L) 07/25/2023 0510   FERRITIN 704 (H) 07/25/2023 0510   IRONPCTSAT 13 07/25/2023 0510  Folate 14.4, B12 893,  Initiating oral and IM/ IV Iron supplements   Thrombocytopenia -Due to  presumed GI bleed, anemia -received 2U PRBC -Received 2 units of platelets on 07/26/2023 -Platelets: 35, 30, 29>>> 136 -Will transfuse 2U of platelets  07/26/2023    Hypokalemia/hypomagnesemia/hypophosphatemia Oral and IV replacement given. Continue to monitor and replete accordingly   IBS-D At home on Bentyl, Imodium   Lower extremity edema Echo from 2023 showing EF 60% and Grade 1 Diastolic dysfunction. No evidence of fluid overload.  At home on Lasix 20 g daily (20 mg IV was given post blood transfusion on 2/27)  Currently on hold   Chronic UTI No active complaints. UA relatively unremarkable.  Hold home Methenamine   Anxiety continue home Xanax PRN.    HLD On Statin   Severe debility with falls -Once stable consulting PT OT for evaluation likely will need home health versus SNF placement   ----------------------------------------------------------------------------------------------------------------------------------------------- Nutritional status:  The patient's BMI is: Body mass index is 26.94 kg/m. I agree with the assessment and plan as outlined  --------------------------------------------------------------------------------------------------------------------------- Cultures; Blood Cultures x 2 >> no growth to date Urine Culture  >>> no growth  Antibiotics: Broad-spectrum antibiotics:  Vancomycin/cefepime/Flagyl 05/31/2018 25 D/C VAC  ------------------------------------------------------------------------------------------------------------------------------------------------  DVT prophylaxis:  Place and maintain sequential compression device Start: 07/27/23 0748 SCDs Start: 07/23/23 1717   Code Status:   Code Status: Limited: Do not attempt resuscitation (DNR) -DNR-LIMITED -Do Not Intubate/DNI   Family Communication: Daughter/son-in-law present at bedside-updated -Advance care planning has been discussed.   Admission status:   Status is:  Inpatient Remains inpatient appropriate because: Needing close monitoring in the stepdown/ICU setting, for pancytopenia, sepsis, needing antibiotics   Disposition: From  - home  Planning for discharge in likely Friday 3/2 home with home health versus SNF  Procedures:   No admission procedures for hospital encounter.   Antimicrobials:  Anti-infectives (From admission, onward)    Start     Dose/Rate Route Frequency Ordered Stop   07/28/23 1000  metroNIDAZOLE (FLAGYL) tablet 500 mg        500 mg Oral 2 times daily 07/28/23 0645     07/26/23 1600  vancomycin (VANCOREADY) IVPB 750 mg/150 mL  Status:  Discontinued        750 mg 150 mL/hr over 60 Minutes Intravenous Every 24 hours 07/26/23 1434 07/28/23 0644   07/25/23 1900  metroNIDAZOLE (FLAGYL) IVPB 500 mg  Status:  Discontinued        500 mg 100 mL/hr over 60 Minutes Intravenous Every 12 hours 07/25/23 1559 07/28/23 0658   07/25/23 1900  ceFEPIme (MAXIPIME) 2 g in sodium chloride 0.9 % 100 mL IVPB        2 g 200 mL/hr over 30 Minutes Intravenous Every 12 hours 07/25/23 1619     07/25/23 1700  ceFEPIme (MAXIPIME) 2 g in sodium chloride 0.9 % 100 mL IVPB  Status:  Discontinued  2 g 200 mL/hr over 30 Minutes Intravenous Every 12 hours 07/25/23 1554 07/25/23 1619   07/25/23 1200  vancomycin (VANCOREADY) IVPB 750 mg/150 mL  Status:  Discontinued        750 mg 150 mL/hr over 60 Minutes Intravenous Every 24 hours 07/24/23 1121 07/26/23 1434   07/24/23 1015  cefTRIAXone (ROCEPHIN) 2 g in sodium chloride 0.9 % 100 mL IVPB  Status:  Discontinued        2 g 200 mL/hr over 30 Minutes Intravenous Every 24 hours 07/24/23 0918 07/25/23 1554   07/24/23 1015  vancomycin (VANCOREADY) IVPB 1500 mg/300 mL        1,500 mg 150 mL/hr over 120 Minutes Intravenous  Once 07/24/23 0918 07/24/23 1305        Medication:   acidophilus  2 capsule Oral TID WC   ALPRAZolam  0.5 mg Oral QHS   atorvastatin  20 mg Oral Daily   calcium carbonate  1  tablet Oral Daily   Chlorhexidine Gluconate Cloth  6 each Topical Daily   cyanocobalamin  1,000 mcg Oral Daily   hydrocortisone sod succinate (SOLU-CORTEF) inj  100 mg Intravenous Q8H   metroNIDAZOLE  500 mg Oral BID   midodrine  10 mg Oral TID WC   potassium & sodium phosphates  1 packet Oral TID WC & HS   sodium chloride flush  10-40 mL Intracatheter Q12H    acetaminophen **OR** acetaminophen, alum & mag hydroxide-simeth, dicyclomine, docusate sodium, loperamide, methocarbamol (ROBAXIN) injection, ondansetron **OR** ondansetron (ZOFRAN) IV, mouth rinse, sodium chloride flush   Objective:   Vitals:   07/28/23 0730 07/28/23 0748 07/28/23 0830 07/28/23 0930  BP: 122/67  (!) 157/95 (!) 163/95  Pulse: 79  (!) 101 (!) 103  Resp: 17  (!) 25 (!) 33  Temp:  (!) 97.4 F (36.3 C)    TempSrc:  Oral    SpO2: 97%  95% 99%  Weight:      Height:        Intake/Output Summary (Last 24 hours) at 07/28/2023 1043 Last data filed at 07/28/2023 0840 Gross per 24 hour  Intake 900.17 ml  Output 600 ml  Net 300.17 ml   Filed Weights   07/23/23 0810 07/24/23 1144 07/26/23 0508  Weight: 68.8 kg 70.9 kg 71.2 kg     Physical examination:          General:  Lethargic, but arousable AAO x 2,  cooperative,     HEENT:  Normocephalic, PERRL, otherwise with in Normal limits   Neuro:  CNII-XII intact. , normal motor and sensation, reflexes intact   Lungs:   Clear to auscultation BL, Respirations unlabored,  No wheezes / crackles  Cardio:    S1/S2, RRR, No murmure, No Rubs or Gallops   Abdomen:  Soft, non-tender, bowel sounds active all four quadrants, no guarding or peritoneal signs.  Muscular  skeletal:  Limited exam -global generalized weaknesses - in bed, able to move all 4 extremities,   2+ pulses,  symmetric, No pitting edema  Skin:  Dry, warm to touch, negative for any Rashes,  Wounds: Please see nursing documentation          ------------------------------------------------------------------------------------------------------------------------------------------    LABs:     Latest Ref Rng & Units 07/28/2023    2:36 AM 07/27/2023    6:35 PM 07/27/2023    2:51 AM  CBC  WBC 4.0 - 10.5 K/uL 1.0   1.5   Hemoglobin 12.0 - 15.0 g/dL 9.5  9.0  9.4   Hematocrit 36.0 - 46.0 % 28.3  27.1  28.1   Platelets 150 - 400 K/uL 111   136       Latest Ref Rng & Units 07/28/2023    2:36 AM 07/27/2023    2:51 AM 07/26/2023    3:38 AM  CMP  Glucose 70 - 99 mg/dL 161  096  045   BUN 8 - 23 mg/dL 25  25  17    Creatinine 0.44 - 1.00 mg/dL 4.09  8.11  9.14   Sodium 135 - 145 mmol/L 136  135  132   Potassium 3.5 - 5.1 mmol/L 3.8  4.0  4.1   Chloride 98 - 111 mmol/L 110  107  106   CO2 22 - 32 mmol/L 20  20  17    Calcium 8.9 - 10.3 mg/dL 6.8  6.7  6.6   Total Protein 6.5 - 8.1 g/dL 5.3     Total Bilirubin 0.0 - 1.2 mg/dL 1.2     Alkaline Phos 38 - 126 U/L 67     AST 15 - 41 U/L 32     ALT 0 - 44 U/L 20          Micro Results Recent Results (from the past 240 hours)  Resp panel by RT-PCR (RSV, Flu A&B, Covid) Anterior Nasal Swab     Status: None   Collection Time: 07/23/23 12:02 PM   Specimen: Anterior Nasal Swab  Result Value Ref Range Status   SARS Coronavirus 2 by RT PCR NEGATIVE NEGATIVE Final    Comment: (NOTE) SARS-CoV-2 target nucleic acids are NOT DETECTED.  The SARS-CoV-2 RNA is generally detectable in upper respiratory specimens during the acute phase of infection. The lowest concentration of SARS-CoV-2 viral copies this assay can detect is 138 copies/mL. A negative result does not preclude SARS-Cov-2 infection and should not be used as the sole basis for treatment or other patient management decisions. A negative result may occur with  improper specimen collection/handling, submission of specimen other than nasopharyngeal swab, presence of viral mutation(s) within the areas targeted by this assay, and  inadequate number of viral copies(<138 copies/mL). A negative result must be combined with clinical observations, patient history, and epidemiological information. The expected result is Negative.  Fact Sheet for Patients:  BloggerCourse.com  Fact Sheet for Healthcare Providers:  SeriousBroker.it  This test is no t yet approved or cleared by the [place] FDA and  has been authorized for detection and/or diagnosis of SARS-CoV-2 by FDA under an Emergency Use Authorization (EUA). This EUA will remain  in effect (meaning this test can be used) for the duration of the COVID-19 declaration under Section 564(b)(1) of the Act, 21 U.S.C.section 360bbb-3(b)(1), unless the authorization is terminated  or revoked sooner.       Influenza A by PCR NEGATIVE NEGATIVE Final   Influenza B by PCR NEGATIVE NEGATIVE Final    Comment: (NOTE) The Xpert Xpress SARS-CoV-2/FLU/RSV plus assay is intended as an aid in the diagnosis of influenza from Nasopharyngeal swab specimens and should not be used as a sole basis for treatment. Nasal washings and aspirates are unacceptable for Xpert Xpress SARS-CoV-2/FLU/RSV testing.  Fact Sheet for Patients: BloggerCourse.com  Fact Sheet for Healthcare Providers: SeriousBroker.it  This test is not yet approved or cleared by the [place] FDA and has been authorized for detection and/or diagnosis of SARS-CoV-2 by FDA under an Emergency Use Authorization (EUA). This EUA will remain in effect (meaning this test can  be used) for the duration of the COVID-19 declaration under Section 564(b)(1) of the Act, 21 U.S.C. section 360bbb-3(b)(1), unless the authorization is terminated or revoked.     Resp Syncytial Virus by PCR NEGATIVE NEGATIVE Final    Comment: (NOTE) Fact Sheet for Patients: BloggerCourse.com  Fact Sheet for Healthcare  Providers: SeriousBroker.it  This test is not yet approved or cleared by the [place] FDA and has been authorized for detection and/or diagnosis of SARS-CoV-2 by FDA under an Emergency Use Authorization (EUA). This EUA will remain in effect (meaning this test can be used) for the duration of the COVID-19 declaration under Section 564(b)(1) of the Act, 21 U.S.C. section 360bbb-3(b)(1), unless the authorization is terminated or revoked.  Performed at [facility], [address], [place], [place] [address]   Culture, blood (Routine X 2) w Reflex to ID Panel     Status: None (Preliminary result)   Collection Time: 07/24/23  9:27 AM   Specimen: BLOOD  Result Value Ref Range Status   Specimen Description BLOOD BLOOD LEFT HAND  Final   Special Requests   Final    BOTTLES DRAWN AEROBIC AND ANAEROBIC Blood Culture adequate volume   Culture   Final    NO GROWTH 4 DAYS Performed at [facility], [address] [place], [place] [address]    Report Status PENDING  Incomplete  Culture, blood (Routine X 2) w Reflex to ID Panel     Status: None (Preliminary result)   Collection Time: 07/24/23  9:27 AM   Specimen: BLOOD  Result Value Ref Range Status   Specimen Description BLOOD BLOOD RIGHT HAND  Final   Special Requests   Final    BOTTLES DRAWN AEROBIC AND ANAEROBIC Blood Culture adequate volume   Culture   Final    NO GROWTH 4 DAYS Performed at [facility], [address] [place], [place] [address]    Report Status PENDING  Incomplete  MRSA Next Gen by PCR, Nasal     Status: None   Collection Time: 07/24/23 11:45 AM   Specimen: Nasal Mucosa; Nasal Swab  Result Value Ref Range Status   MRSA by PCR Next Gen NOT DETECTED NOT DETECTED Final    Comment: (NOTE) The GeneXpert MRSA Assay (FDA approved for NASAL specimens only), is one component of a comprehensive MRSA colonization surveillance program. It is not intended to diagnose MRSA infection nor to guide or  monitor treatment for MRSA infections. Test performance is not FDA approved in patients less than 23 years old. Performed at [facility], [address], [place], [place] [address]   Culture, blood (Routine X 2) w Reflex to ID Panel     Status: None (Preliminary result)   Collection Time: 07/25/23 12:31 PM   Specimen: BLOOD  Result Value Ref Range Status   Specimen Description BLOOD RIGHT ANTECUBITAL  Final   Special Requests   Final    BOTTLES DRAWN AEROBIC AND ANAEROBIC Blood Culture adequate volume   Culture   Final    NO GROWTH 3 DAYS Performed at [facility], [address] [place], [place] [address]    Report Status PENDING  Incomplete  Culture, blood (Routine X 2) w Reflex to ID Panel     Status: None (Preliminary result)   Collection Time: 07/25/23 12:37 PM   Specimen: BLOOD  Result Value Ref Range Status   Specimen Description BLOOD LEFT ANTECUBITAL  Final   Special Requests   Final    BOTTLES DRAWN AEROBIC  AND ANAEROBIC Blood Culture adequate volume   Culture   Final    NO GROWTH 3 DAYS Performed at [facility], [address] [place], [place] [address]    Report Status PENDING  Incomplete    Radiology Reports IR BONE MARROW BIOPSY Result Date: 07/27/2023 INDICATION: 89 year old with pancytopenia.  Request for bone marrow biopsy. EXAM: FLUOROSCOPIC GUIDED BONE MARROW ASPIRATES AND BIOPSY Physician: Rachelle Hora. Henn, MD MEDICATIONS: Moderate sedation ANESTHESIA/SEDATION: Moderate (conscious) sedation was employed during this procedure. A total of Versed 0.5mg  and fentanyl 25 mcg was administered intravenously at the order of the provider performing the procedure. Total intra-service moderate sedation time: 14 minutes. Patient's level of consciousness and vital signs were monitored continuously by radiology nurse throughout the procedure under the supervision of the provider performing the procedure. COMPLICATIONS: None immediate. FLUOROSCOPY: Radiation Exposure Index (as  provided by the fluoroscopic device): 13.3 mGy Kerma PROCEDURE: The procedure was explained to the patient. The risks and benefits of the procedure were discussed and the patient's questions were addressed. Informed consent was obtained from the patient. The patient was placed prone on interventional table. The back was prepped and draped in sterile fashion. Maximal barrier sterile technique was utilized including caps, mask, sterile gowns, sterile gloves, sterile drape, hand hygiene and skin antiseptic. The skin and right posterior ilium were anesthetized with 1% lidocaine. OnControl bone needle was directed into the right ilium with fluoroscopic guidance. Two aspirates were obtained and one core biopsy was obtained with the powered drill. Two additional cores were performed with the 11 gauge hand bone drill using fluoroscopic guidance. Bandage placed over the puncture site. Fluoroscopic image saved for documentation. FINDINGS: Biopsy needles was directed into the posterior right ilium. Small core biopsy specimens were obtained. IMPRESSION: Fluoroscopic guided bone marrow aspiration and core biopsy. Electronically Signed   By: Richarda Overlie M.D.   On: 07/27/2023 13:52     SIGNED: Kendell Bane, MD, FHM. FAAFP. [facility] Critical care  time spent 55 minutes.  In seeing, evaluating and examining the patient. Reviewing medical records, labs, drawn plan of care. [facility],  Pager (please use [email or web address] to page/ text) Please use Epic Secure Chat for non-urgent communication (7AM-7PM)  If 7PM-7AM, please contact night-coverage [email or web address], 07/28/2023, 10:43 AM

## 2023-07-28 NOTE — Evaluation (Signed)
 Physical Therapy Evaluation Patient Details Name: MEMORY HEINRICHS MRN: [id number] DOB: 13-Dec-1934 Today's Date: 07/28/2023  History of Present Illness  a 89 y.o. 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  Patient lives at home and able to take care of her ADLs.  2/11, seen by GI as an outpatient with concern of black stools, poor appetite. Elective EGD was planned for March.  Patient continued to have black stool leading to progressive weakness.    2/24, patient felt dizzy, unsteady and had a fall and hence brought to the ED by EMS from home.  Clinical Impression   Pt tolerated today's Physical Therapy Evaluation, but severely limited due to weakness. Pt is +2 for transfers and lifting, per RN. Pt unable to achieve full sit/stand from recliner with PT only, pt requesting not a lot due to fatigue level this session. Pt's baseline is independent with ambulation, transfers, driving, and most ADLs. Currently, pt demonstrating +2 assist given for transfers and mobility, limitations in all functional mobility due to severe muscle weakness, deconditioning, balance deficits, and chronic LBP.   Based upon these deficits/impairments, patient will benefit from continued skilled physical therapy services during remainder of hospital stay and at the next recommended venue of care to address deficits and promote return to optimal function.                If plan is discharge home, recommend the following: Two people to help with walking and/or transfers;Two people to help with bathing/dressing/bathroom   Can travel by private vehicle   No    Equipment Recommendations None recommended by PT  Recommendations for Other Services       Functional Status Assessment Patient has had a recent decline in their functional status and demonstrates the ability to make significant improvements in  function in a reasonable and predictable amount of time.     Precautions / Restrictions Precautions Precautions: Fall Restrictions Weight Bearing Restrictions Per Provider Order: No      Mobility  Bed Mobility               General bed mobility comments: Received in recliner, +2 per pt's RN. Patient Response: Cooperative  Transfers Overall transfer level: Needs assistance   Transfers: Sit to/from Stand Sit to Stand: Total assist           General transfer comment: Unable to achieve powerup to stand with 3 attempts to RW. +2 for transfer    Ambulation/Gait               General Gait Details: NOt indicated due to safety concerns.  Stairs            Wheelchair Mobility     Tilt Bed Tilt Bed Patient Response: Cooperative  Modified Rankin (Stroke Patients Only)       Balance Overall balance assessment: Needs assistance Sitting-balance support: Bilateral upper extremity supported Sitting balance-Leahy Scale: Good Sitting balance - Comments: sitting in recliner     Standing balance-Leahy Scale: Zero Standing balance comment: Unable to achieve                             Pertinent Vitals/Pain Pain Assessment Pain Assessment: No/denies pain    Home Living Family/patient expects to be discharged to:: Private residence Living Arrangements: Alone Available Help at Discharge: Personal care attendant Type of Home: House Home Access: Ramped  entrance       Home Layout: One level Home Equipment: Agricultural consultant (2 wheels);Cane - single point;BSC/3in1      Prior Function Prior Level of Function : Independent/Modified Independent             Mobility Comments: Prior to fall, independent with no AD in household, using AD in community ADLs Comments: Sponge bathing level, independent with other ADLs. Reports aide who comes in but did not elaborate on what aide assists with.     Extremity/Trunk Assessment   Upper Extremity  Assessment Upper Extremity Assessment: Defer to OT evaluation    Lower Extremity Assessment Lower Extremity Assessment: Generalized weakness;RLE deficits/detail;LLE deficits/detail RLE Deficits / Details: 3-/5 knee extension RLE Sensation: WNL LLE Deficits / Details: 3-/5 knee extension LLE Sensation: WNL    Cervical / Trunk Assessment Cervical / Trunk Assessment: Kyphotic  Communication        Cognition Arousal: Alert Behavior During Therapy: WFL for tasks assessed/performed   PT - Cognitive impairments: No apparent impairments                         Following commands: Intact       Cueing Cueing Techniques: Verbal cues     General Comments      Exercises     Assessment/Plan    PT Assessment Patient needs continued PT services  PT Problem List Decreased strength;Decreased activity tolerance;Decreased balance;Decreased mobility       PT Treatment Interventions DME instruction;Gait training;Functional mobility training;Therapeutic activities;Therapeutic exercise;Balance training;Neuromuscular re-education    PT Goals (Current goals can be found in the Care Plan section)  Acute Rehab PT Goals Patient Stated Goal: go to rehab PT Goal Formulation: With patient Time For Goal Achievement: 08/11/23 Potential to Achieve Goals: Good    Frequency Min 3X/week     Co-evaluation               AM-PAC PT "6 Clicks" Mobility  Outcome Measure Help needed turning from your back to your side while in a flat bed without using bedrails?: A Lot Help needed moving from lying on your back to sitting on the side of a flat bed without using bedrails?: A Lot Help needed moving to and from a bed to a chair (including a wheelchair)?: Total Help needed standing up from a chair using your arms (e.g., wheelchair or bedside chair)?: Total Help needed to walk in hospital room?: Total Help needed climbing 3-5 steps with a railing? : Total 6 Click Score: 8    End of  Session     Patient left: in chair;with call bell/phone within reach;with nursing/sitter in room Nurse Communication: Mobility status PT Visit Diagnosis: Unsteadiness on feet (R26.81);Muscle weakness (generalized) (M62.81)    Time: 1610-9604 PT Time Calculation (min) (ACUTE ONLY): 28 min   Charges:   PT Evaluation $PT Eval Low Complexity: 1 Low   PT General Charges $$ ACUTE PT VISIT: 1 Visit         Elie Goody, DPT [facility]- [place] [phone] office  Nelida Meuse 07/28/2023, 12:03 PM

## 2023-07-28 NOTE — Plan of Care (Signed)
  Problem: Acute Rehab PT Goals(only PT should resolve) Goal: Pt Will Go Supine/Side To Sit Flowsheets (Taken 07/28/2023 1206) Pt will go Supine/Side to Sit: with minimal assist Goal: Patient Will Perform Sitting Balance Flowsheets (Taken 07/28/2023 1206) Patient will perform sitting balance: with minimal assist Goal: Patient Will Transfer Sit To/From Stand Flowsheets (Taken 07/28/2023 1206) Patient will transfer sit to/from stand: with minimal assist Goal: Pt Will Transfer Bed To Chair/Chair To Bed Flowsheets (Taken 07/28/2023 1206) Pt will Transfer Bed to Chair/Chair to Bed: with min assist Goal: Pt Will Perform Standing Balance Or Pre-Gait Flowsheets (Taken 07/28/2023 1206) Pt will perform standing balance or pre-gait: with minimal assist Goal: Pt Will Ambulate Flowsheets (Taken 07/28/2023 1206) Pt will Ambulate:  10 feet  with minimal assist  with rolling walker  Nelida Meuse PT, DPT [facility]- [place] [phone] office

## 2023-07-29 DIAGNOSIS — A419 Sepsis, unspecified organism: Secondary | ICD-10-CM | POA: Diagnosis not present

## 2023-07-29 DIAGNOSIS — K922 Gastrointestinal hemorrhage, unspecified: Secondary | ICD-10-CM | POA: Diagnosis not present

## 2023-07-29 LAB — CBC WITH DIFFERENTIAL/PLATELET
Abs Immature Granulocytes: 0 10*3/uL (ref 0.00–0.07)
Basophils Absolute: 0 10*3/uL (ref 0.0–0.1)
Basophils Relative: 0 %
Eosinophils Absolute: 0 10*3/uL (ref 0.0–0.5)
Eosinophils Relative: 0 %
HCT: 27.2 % — ABNORMAL LOW (ref 36.0–46.0)
Hemoglobin: 9 g/dL — ABNORMAL LOW (ref 12.0–15.0)
Lymphocytes Relative: 46 %
Lymphs Abs: 0.4 10*3/uL — ABNORMAL LOW (ref 0.7–4.0)
MCH: 29.8 pg (ref 26.0–34.0)
MCHC: 33.1 g/dL (ref 30.0–36.0)
MCV: 90.1 fL (ref 80.0–100.0)
Monocytes Absolute: 0 10*3/uL — ABNORMAL LOW (ref 0.1–1.0)
Monocytes Relative: 0 %
Neutro Abs: 0.5 10*3/uL — ABNORMAL LOW (ref 1.7–7.7)
Neutrophils Relative %: 54 %
Platelets: 79 10*3/uL — ABNORMAL LOW (ref 150–400)
RBC: 3.02 MIL/uL — ABNORMAL LOW (ref 3.87–5.11)
RDW: 18.2 % — ABNORMAL HIGH (ref 11.5–15.5)
WBC: 0.9 10*3/uL — CL (ref 4.0–10.5)
nRBC: 0 % (ref 0.0–0.2)

## 2023-07-29 LAB — COMPREHENSIVE METABOLIC PANEL
ALT: 20 U/L (ref 0–44)
AST: 35 U/L (ref 15–41)
Albumin: 2.5 g/dL — ABNORMAL LOW (ref 3.5–5.0)
Alkaline Phosphatase: 57 U/L (ref 38–126)
Anion gap: 3 — ABNORMAL LOW (ref 5–15)
BUN: 20 mg/dL (ref 8–23)
CO2: 22 mmol/L (ref 22–32)
Calcium: 6.8 mg/dL — ABNORMAL LOW (ref 8.9–10.3)
Chloride: 113 mmol/L — ABNORMAL HIGH (ref 98–111)
Creatinine, Ser: 0.63 mg/dL (ref 0.44–1.00)
GFR, Estimated: >60 mL/min (ref >60)
Glucose, Bld: 113 mg/dL — ABNORMAL HIGH (ref 70–99)
Potassium: 3.3 mmol/L — ABNORMAL LOW (ref 3.5–5.1)
Sodium: 138 mmol/L (ref 135–145)
Total Bilirubin: 1.2 mg/dL (ref 0.0–1.2)
Total Protein: 5.3 g/dL — ABNORMAL LOW (ref 6.5–8.1)

## 2023-07-29 LAB — CULTURE, BLOOD (ROUTINE X 2)
Culture: NO GROWTH
Culture: NO GROWTH
Special Requests: ADEQUATE
Special Requests: ADEQUATE

## 2023-07-29 MED ORDER — METOPROLOL TARTRATE 50 MG PO TABS
50.0000 mg | ORAL_TABLET | Freq: Two times a day (BID) | ORAL | Status: DC
  Administered 2023-07-29 – 2023-08-01 (×6): 50 mg via ORAL
  Filled 2023-07-29 (×6): qty 1

## 2023-07-29 MED ORDER — POTASSIUM CHLORIDE CRYS ER 20 MEQ PO TBCR
20.0000 meq | EXTENDED_RELEASE_TABLET | Freq: Once | ORAL | Status: AC
  Administered 2023-07-29: 20 meq via ORAL
  Filled 2023-07-29: qty 1

## 2023-07-29 MED ORDER — AMLODIPINE BESYLATE 5 MG PO TABS
5.0000 mg | ORAL_TABLET | Freq: Every day | ORAL | Status: DC
  Administered 2023-07-29 – 2023-08-01 (×4): 5 mg via ORAL
  Filled 2023-07-29 (×4): qty 1

## 2023-07-29 NOTE — TOC Progression Note (Addendum)
 Transition of Care [facility]) - Progression Note    Patient Details  Name: Stephanie Wood MRN: [id number] Date of Birth: 1935-04-24  [facility] CM/SW Contact  Catalina Gravel, [place] Phone Number: 07/29/2023, 12:19 PM  Clinical Narrative:    Pt family in agreement with SNF.  Daughter Lorel Monaco spoke with CSW, first choice in [facility].  She stated they are driving to look at other facilities possible. CSW provided Medicare.gov as a site to find area facilities and their ratings.  Daughter resides in [place]- she can visit Monday at any time neeed to speak to [facility] staff- asked that someone call if needed to meet at hospital at a specific time.  CSW called [facility] to start - auth VM.  Pt DC ready Tuesday per MD.  TOC to follow.  Addendum: Auth started with [facility] once they returned call.     Barriers to Discharge: Continued Medical Work up  Expected Discharge Plan and Services                                               Social Determinants of Health (SDOH) Interventions SDOH Screenings   Food Insecurity: No Food Insecurity (07/23/2023)  Housing: Low Risk  (07/23/2023)  Transportation Needs: No Transportation Needs (07/23/2023)  Utilities: Not At Risk (07/23/2023)  Alcohol Screen: Low Risk  (12/20/2022)  Depression (PHQ2-9): Low Risk  (07/02/2023)  Financial Resource Strain: Low Risk  (12/20/2022)  Physical Activity: Sufficiently Active (12/20/2022)  Social Connections: Socially Integrated (07/23/2023)  Stress: No Stress Concern Present (12/20/2022)  Tobacco Use: Low Risk  (07/23/2023)  Health Literacy: Adequate Health Literacy (12/20/2022)    Readmission Risk Interventions     No data to display

## 2023-07-29 NOTE — Plan of Care (Signed)

## 2023-07-29 NOTE — Progress Notes (Signed)
 PROGRESS NOTE    Patient: Stephanie Wood                            PCP: Billie Lade, MD                    DOB: [birth year]            DOA: 07/23/2023 ZOX:[id number]             DOS: 07/29/2023, 2:00 PM   LOS: 6 days   Date of Service: The patient was seen and examined on 07/29/2023  Subjective:   The patient was seen and examined this morning, lethargic but easily arousable following command in no acute distress  Improved vitals, hemodynamically stable actually hypertensive today Complaining of 1 episode of diarrhea earlier this morning.   Brief Narrative:   JENIKA CHIEM is a 89 y.o. 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 Patient lives at home and able to take care of her ADLs. 2/11, seen by GI as an outpatient with concern of black stools, poor appetite. Elective EGD was planned for March. Patient continued to have black stool leading to progressive weakness.   2/24, patient felt dizzy, unsteady and had a fall and hence brought to the ED by EMS from home.   ED:  Tmax. 101.2, heart rate in 90s, blood pressure 128/58, breathing on 2 L oxygen by nasal cannula. Labs with WBC count 1.3, hemoglobin 9.4, platelets 50, sodium 134, potassium 3, magnesium 1.4, phosphorus 1.5. Urinalysis unremarkable Fecal occult blood positive CT abdomen pelvis with contrast showed chronic moderate to large gastric hiatal hernia, spleen normal CT head showed mild chronic small vessel ischemic changes, generalized parenchymal atrophy. GI was consulted   Assessment and plan:   Sepsis - POA  Septic shock with hypotension  07/29/2023-much improved, hemodynamically stable, resolved sepsis physiology resolved septic shock Antibiotic discontinued-no growth on all cultures.   -07/28/2023, or awake, hemodynamically stable    -POA: and on 07/25/2023 patient met sepsis criteria with,  tachypnea, Tmax 103.1, tachycardia -IV fluid was initiated, with 2U PRBC blood transfusion -Broaden antibiotics to vancomycin, cefepime and Flagyl>>> discontinue vancomycin, changing Flagyl to p.o.  -No source of infection identified yet -UA, chest x-ray reviewed -Lactic acid 1.1, 1.2, WBC 1.8,  = For hypotension, started on midodrine orally-D/Ced  = Levophed gtt ordered -D/Ced     -PICC line was placed 12/23/2023 -07/27/2023- S/P Bone marrow biopsy   New onset anemia -Heme positive stool -Due to patient's comorbidities, frailty did not want to pursue any endoscopy at this point Signed off. 07/25/2023-received 2U PRBC blood transfusion  Baseline hemoglobin 12.3 from September 2024, hemoglobin on admission 9.4.  Also has low WBC count and low platelet count FOBT positive but no overtly bleeding  EGD/colonoscopy -deferred due to thrombocytopenia Continue IV Protonix 40 mg twice daily    Latest Ref Rng & Units 07/29/2023    5:30 AM 07/28/2023   11:11 AM 07/28/2023    2:36 AM  CBC  WBC 4.0 - 10.5 K/uL 0.9   1.0   Hemoglobin 12.0 - 15.0 g/dL 9.0  81.1  9.5   Hematocrit 36.0 - 46.0 % 27.2  31.0  28.3   Platelets 150 - 400 K/uL 79   111    (Discussed with her son-in-law, daughter and patient at bedside)   Pancytopenia -possibly  leukemia  -07/27/2023- S/P Bone marrow biopsy  -S/P 2 U PRBC transfusion, 07/25/2023 -S/P 2 U platelet transfusion on 07/27/23   All cell lines are depressed. Last blood work from September 2024 with normal WBC count, hemoglobin and platelet   Unclear etiology -sepsis related versus possible underlying malignancy   -Blood cultures remain negative - -Discussed with Dr. Ellin Saba -following closely (to be called with any questions) Pending final report from bone marrow biopsy  Acute leukemia with acute reaction is on differential diagnosis Patient is to follow-up as an outpatient    Iron/TIBC/Ferritin/ %Sat    Component Value Date/Time   IRON 19 (L)  07/25/2023 0510   TIBC 152 (L) 07/25/2023 0510   FERRITIN 704 (H) 07/25/2023 0510   IRONPCTSAT 13 07/25/2023 0510  Folate 14.4, B12 893, -Continue iron supplements   Thrombocytopenia -Due to presumed GI bleed, anemia -received 2U PRBC -Received 2 units of platelets on 07/26/2023 -Platelets: 35, 30, 29>>> 136 >>79  -No signs of active bleeding    Hypokalemia/hypomagnesemia/hypophosphatemia Oral and IV replacement given. Continue to monitor and replete accordingly   IBS-D At home on Bentyl, Imodium   Lower extremity edema Echo from 2023 showing EF 60% and Grade 1 Diastolic dysfunction. No evidence of fluid overload.  At home on Lasix 20 g daily (20 mg IV was given post blood transfusion on 2/27)  Currently on hold   Chronic UTI No active complaints. UA relatively unremarkable.  Hold home Methenamine   Anxiety continue home Xanax PRN.    HLD On Statin   Severe debility with falls -Once stable consulting PT OT for evaluation likely will need home health versus SNF placement   07/29/2023 patient stable  -transferred out of ICU   ----------------------------------------------------------------------------------------------------------------------------------------------- Nutritional status:  The patient's BMI is: Body mass index is 26.94 kg/m. I agree with the assessment and plan as outlined  --------------------------------------------------------------------------------------------------------------------------- Cultures; Blood Cultures x 2 >> no growth to date Urine Culture  >>> no growth to date  Antibiotics: Broad-spectrum antibiotics:  Vancomycin/cefepime/Flagyl 3/1/202025 D/C VAC 07/29/2023 discontinued cefepime and Flagyl  Per Dr. Ellin Saba keep PICC line fat D/C  ------------------------------------------------------------------------------------------------------------------------------------------------  DVT prophylaxis:  Place and maintain  sequential compression device Start: 07/27/23 0748 SCDs Start: 07/23/23 1717   Code Status:   Code Status: Limited: Do not attempt resuscitation (DNR) -DNR-LIMITED -Do Not Intubate/DNI   Family Communication: Daughter/son-in-law present at bedside-updated -Advance care planning has been discussed.   Admission status:   Status is: Inpatient Remains inpatient appropriate because: Needing close monitoring in the stepdown/ICU setting, for pancytopenia, sepsis, needing antibiotics   Disposition: From  - home  Planning for discharge in likely Friday 3/2 home with home health versus SNF  Procedures:   No admission procedures for hospital encounter.   Antimicrobials:  Anti-infectives (From admission, onward)    Start     Dose/Rate Route Frequency Ordered Stop   07/28/23 1000  metroNIDAZOLE (FLAGYL) tablet 500 mg  Status:  Discontinued        500 mg Oral 2 times daily 07/28/23 0645 07/29/23 1347   07/26/23 1600  vancomycin (VANCOREADY) IVPB 750 mg/150 mL  Status:  Discontinued        750 mg 150 mL/hr over 60 Minutes Intravenous Every 24 hours 07/26/23 1434 07/28/23 0644   07/25/23 1900  metroNIDAZOLE (FLAGYL) IVPB 500 mg  Status:  Discontinued        500 mg 100 mL/hr over 60 Minutes Intravenous Every 12 hours 07/25/23 1559 07/28/23 0658   07/25/23  1900  ceFEPIme (MAXIPIME) 2 g in sodium chloride 0.9 % 100 mL IVPB  Status:  Discontinued        2 g 200 mL/hr over 30 Minutes Intravenous Every 12 hours 07/25/23 1619 07/29/23 1347   07/25/23 1700  ceFEPIme (MAXIPIME) 2 g in sodium chloride 0.9 % 100 mL IVPB  Status:  Discontinued        2 g 200 mL/hr over 30 Minutes Intravenous Every 12 hours 07/25/23 1554 07/25/23 1619   07/25/23 1200  vancomycin (VANCOREADY) IVPB 750 mg/150 mL  Status:  Discontinued        750 mg 150 mL/hr over 60 Minutes Intravenous Every 24 hours 07/24/23 1121 07/26/23 1434   07/24/23 1015  cefTRIAXone (ROCEPHIN) 2 g in sodium chloride 0.9 % 100 mL IVPB  Status:   Discontinued        2 g 200 mL/hr over 30 Minutes Intravenous Every 24 hours 07/24/23 0918 07/25/23 1554   07/24/23 1015  vancomycin (VANCOREADY) IVPB 1500 mg/300 mL        1,500 mg 150 mL/hr over 120 Minutes Intravenous  Once 07/24/23 0918 07/24/23 1305        Medication:   acidophilus  2 capsule Oral TID WC   ALPRAZolam  0.5 mg Oral QHS   amLODipine  5 mg Oral Daily   atorvastatin  20 mg Oral Daily   calcium carbonate  1 tablet Oral Daily   Chlorhexidine Gluconate Cloth  6 each Topical Daily   cyanocobalamin  1,000 mcg Oral Daily   metoprolol tartrate  50 mg Oral BID   potassium & sodium phosphates  1 packet Oral TID WC & HS   sodium chloride flush  10-40 mL Intracatheter Q12H    acetaminophen **OR** acetaminophen, alum & mag hydroxide-simeth, dicyclomine, docusate sodium, hydrALAZINE, loperamide, methocarbamol (ROBAXIN) injection, ondansetron **OR** ondansetron (ZOFRAN) IV, mouth rinse, sodium chloride flush   Objective:   Vitals:   07/29/23 0757 07/29/23 0800 07/29/23 0900 07/29/23 1125  BP:  (!) 151/89 (!) 189/75   Pulse:  96 (!) 103   Resp:  18 20   Temp: 97.8 F (36.6 C)   98.2 F (36.8 C)  TempSrc: Oral   Oral  SpO2:  97% 92%   Weight:      Height:        Intake/Output Summary (Last 24 hours) at 07/29/2023 1400 Last data filed at 07/29/2023 1224 Gross per 24 hour  Intake 326.11 ml  Output 2600 ml  Net -2273.89 ml   Filed Weights   07/23/23 0810 07/24/23 1144 07/26/23 0508  Weight: 68.8 kg 70.9 kg 71.2 kg     Physical examination:    General:  Lethargic easily arousable AAO x 2,  cooperative,   HEENT:  Normocephalic, PERRL, otherwise with in Normal limits   Neuro:  CNII-XII intact. , normal motor and sensation, reflexes intact   Lungs:   Clear to auscultation BL, Respirations unlabored,  No wheezes / crackles  Cardio:    S1/S2, RRR, No murmure, No Rubs or Gallops   Abdomen:  Soft, non-tender, bowel sounds active all four quadrants, no guarding or  peritoneal signs.  Muscular  skeletal:  Limited exam -global generalized weaknesses - in bed, able to move all 4 extremities,   2+ pulses,  symmetric, No pitting edema  Skin:  Dry, warm to touch, negative for any Rashes,  Wounds: Please see nursing documentation         ------------------------------------------------------------------------------------------------------------------------------------------    LABs:  Latest Ref Rng & Units 07/29/2023    5:30 AM 07/28/2023   11:11 AM 07/28/2023    2:36 AM  CBC  WBC 4.0 - 10.5 K/uL 0.9   1.0   Hemoglobin 12.0 - 15.0 g/dL 9.0  16.1  9.5   Hematocrit 36.0 - 46.0 % 27.2  31.0  28.3   Platelets 150 - 400 K/uL 79   111       Latest Ref Rng & Units 07/29/2023    5:30 AM 07/28/2023    2:36 AM 07/27/2023    2:51 AM  CMP  Glucose 70 - 99 mg/dL 096  045  409   BUN 8 - 23 mg/dL 20  25  25    Creatinine 0.44 - 1.00 mg/dL 8.11  9.14  7.82   Sodium 135 - 145 mmol/L 138  136  135   Potassium 3.5 - 5.1 mmol/L 3.3  3.8  4.0   Chloride 98 - 111 mmol/L 113  110  107   CO2 22 - 32 mmol/L 22  20  20    Calcium 8.9 - 10.3 mg/dL 6.8  6.8  6.7   Total Protein 6.5 - 8.1 g/dL 5.3  5.3    Total Bilirubin 0.0 - 1.2 mg/dL 1.2  1.2    Alkaline Phos 38 - 126 U/L 57  67    AST 15 - 41 U/L 35  32    ALT 0 - 44 U/L 20  20         Micro Results Recent Results (from the past 240 hours)  Resp panel by RT-PCR (RSV, Flu A&B, Covid) Anterior Nasal Swab     Status: None   Collection Time: 07/23/23 12:02 PM   Specimen: Anterior Nasal Swab  Result Value Ref Range Status   SARS Coronavirus 2 by RT PCR NEGATIVE NEGATIVE Final    Comment: (NOTE) SARS-CoV-2 target nucleic acids are NOT DETECTED.  The SARS-CoV-2 RNA is generally detectable in upper respiratory specimens during the acute phase of infection. The lowest concentration of SARS-CoV-2 viral copies this assay can detect is 138 copies/mL. A negative result does not preclude SARS-Cov-2 infection and  should not be used as the sole basis for treatment or other patient management decisions. A negative result may occur with  improper specimen collection/handling, submission of specimen other than nasopharyngeal swab, presence of viral mutation(s) within the areas targeted by this assay, and inadequate number of viral copies(<138 copies/mL). A negative result must be combined with clinical observations, patient history, and epidemiological information. The expected result is Negative.  Fact Sheet for Patients:  BloggerCourse.com  Fact Sheet for Healthcare Providers:  SeriousBroker.it  This test is no t yet approved or cleared by the [place] FDA and  has been authorized for detection and/or diagnosis of SARS-CoV-2 by FDA under an Emergency Use Authorization (EUA). This EUA will remain  in effect (meaning this test can be used) for the duration of the COVID-19 declaration under Section 564(b)(1) of the Act, 21 U.S.C.section 360bbb-3(b)(1), unless the authorization is terminated  or revoked sooner.       Influenza A by PCR NEGATIVE NEGATIVE Final   Influenza B by PCR NEGATIVE NEGATIVE Final    Comment: (NOTE) The Xpert Xpress SARS-CoV-2/FLU/RSV plus assay is intended as an aid in the diagnosis of influenza from Nasopharyngeal swab specimens and should not be used as a sole basis for treatment. Nasal washings and aspirates are unacceptable for Xpert Xpress SARS-CoV-2/FLU/RSV testing.  Fact Sheet for Patients:  BloggerCourse.com  Fact Sheet for Healthcare Providers: SeriousBroker.it  This test is not yet approved or cleared by the [place] FDA and has been authorized for detection and/or diagnosis of SARS-CoV-2 by FDA under an Emergency Use Authorization (EUA). This EUA will remain in effect (meaning this test can be used) for the duration of the COVID-19 declaration  under Section 564(b)(1) of the Act, 21 U.S.C. section 360bbb-3(b)(1), unless the authorization is terminated or revoked.     Resp Syncytial Virus by PCR NEGATIVE NEGATIVE Final    Comment: (NOTE) Fact Sheet for Patients: BloggerCourse.com  Fact Sheet for Healthcare Providers: SeriousBroker.it  This test is not yet approved or cleared by the [place] FDA and has been authorized for detection and/or diagnosis of SARS-CoV-2 by FDA under an Emergency Use Authorization (EUA). This EUA will remain in effect (meaning this test can be used) for the duration of the COVID-19 declaration under Section 564(b)(1) of the Act, 21 U.S.C. section 360bbb-3(b)(1), unless the authorization is terminated or revoked.  Performed at [facility], [address] [place], [place] [address]   Culture, blood (Routine X 2) w Reflex to ID Panel     Status: None   Collection Time: 07/24/23  9:27 AM   Specimen: BLOOD  Result Value Ref Range Status   Specimen Description BLOOD BLOOD LEFT HAND  Final   Special Requests   Final    BOTTLES DRAWN AEROBIC AND ANAEROBIC Blood Culture adequate volume   Culture   Final    NO GROWTH 5 DAYS Performed at [facility], [address] [place], [place] [address]    Report Status 07/29/2023 FINAL  Final  Culture, blood (Routine X 2) w Reflex to ID Panel     Status: None   Collection Time: 07/24/23  9:27 AM   Specimen: BLOOD  Result Value Ref Range Status   Specimen Description BLOOD BLOOD RIGHT HAND  Final   Special Requests   Final    BOTTLES DRAWN AEROBIC AND ANAEROBIC Blood Culture adequate volume   Culture   Final    NO GROWTH 5 DAYS Performed at [facility], [address], [place], [place] [address]    Report Status 07/29/2023 FINAL  Final  MRSA Next Gen by PCR, Nasal     Status: None   Collection Time: 07/24/23 11:45 AM   Specimen: Nasal Mucosa; Nasal Swab  Result Value Ref Range Status   MRSA by PCR  Next Gen NOT DETECTED NOT DETECTED Final    Comment: (NOTE) The GeneXpert MRSA Assay (FDA approved for NASAL specimens only), is one component of a comprehensive MRSA colonization surveillance program. It is not intended to diagnose MRSA infection nor to guide or monitor treatment for MRSA infections. Test performance is not FDA approved in patients less than 28 years old. Performed at [facility], [address] [place], [place] [address]   Culture, blood (Routine X 2) w Reflex to ID Panel     Status: None (Preliminary result)   Collection Time: 07/25/23 12:31 PM   Specimen: BLOOD  Result Value Ref Range Status   Specimen Description BLOOD RIGHT ANTECUBITAL  Final   Special Requests   Final    BOTTLES DRAWN AEROBIC AND ANAEROBIC Blood Culture adequate volume   Culture   Final    NO GROWTH 4 DAYS Performed at [facility], [address], [place], [place] [address]    Report Status PENDING  Incomplete  Culture, blood (Routine X 2) w Reflex to ID  Panel     Status: None (Preliminary result)   Collection Time: 07/25/23 12:37 PM   Specimen: BLOOD  Result Value Ref Range Status   Specimen Description BLOOD LEFT ANTECUBITAL  Final   Special Requests   Final    BOTTLES DRAWN AEROBIC AND ANAEROBIC Blood Culture adequate volume   Culture   Final    NO GROWTH 4 DAYS Performed at [facility], [address] [place], [place] [address]    Report Status PENDING  Incomplete    Radiology Reports No results found.    SIGNED: Kendell Bane, MD, FHM. FAAFP. [facility] Critical care  time spent 55 minutes.  In seeing, evaluating and examining the patient. Reviewing medical records, labs, drawn plan of care. [facility],  Pager (please use [email or web address] to page/ text) Please use Epic Secure Chat for non-urgent communication (7AM-7PM)  If 7PM-7AM, please contact night-coverage [email or web address], 07/29/2023, 2:00 PM

## 2023-07-29 NOTE — [place] FL2 (Signed)
 [place] MEDICAID FL2 LEVEL OF CARE FORM     IDENTIFICATION  Patient Name: Stephanie Wood Birthdate: 1935/01/30 Sex: female Admission Date (Current Location): 07/23/2023  [facility] and [place] Number:  [employer] and Address:  [facility],  [address] [place] [address]      Provider Number: [id number]  Attending Physician Name and Address:  Kendell Bane, MD  Relative Name and Phone Number:  Verta Ellen (Daughter)  [phone] (    Current Level of Care: Hospital Recommended Level of Care: Skilled Nursing Facility Prior Approval Number:    Date Approved/Denied:   PASRR Number: [id number]  Discharge Plan: SNF    Current Diagnoses: Patient Active Problem List   Diagnosis Date Noted   Sepsis (HCC) 07/26/2023   GI bleed 07/23/2023   Hyperglycemia 07/23/2023   Fever, unknown origin 07/02/2023   Chronic diastolic heart failure (HCC) 05/15/2023   Need for influenza vaccination 02/12/2023   Insomnia 02/12/2023   Hyperlipidemia 10/11/2022   Essential hypertension 10/11/2022   CKD (chronic kidney disease) stage 3, GFR 30-59 ml/min (HCC) 10/11/2022   Recurrent UTI 10/11/2022   Atrophic vaginitis 10/11/2022   Mild mitral regurgitation 10/11/2022   Chest pain of uncertain etiology 05/08/2022   Family history of coronary artery bypass graft 05/08/2022   Bilateral swelling of feet 05/08/2022   Regurgitation of food 03/13/2022   Lower abdominal pain 01/10/2022   Fecal occult blood test positive 01/10/2022   Transaminitis 02/01/2021   Iron deficiency anemia 02/01/2021   Low iron    Pancytopenia (HCC)    Nausea    Elevated LFTs 01/08/2021   Irritable bowel syndrome with diarrhea 01/21/2019   Pill dysphagia 01/21/2019   Osteoporosis 07/29/2013   Gastroesophageal reflux disease 07/29/2013   Other and unspecified hyperlipidemia 07/29/2013   Anxiety state 07/29/2013    Orientation RESPIRATION BLADDER Height & Weight     Self, Time,  Situation, Place  O2 (See DC summary) Continent, External catheter Weight: 156 lb 15.5 oz (71.2 kg) Height:  5\' 4"  (162.6 cm)  BEHAVIORAL SYMPTOMS/MOOD NEUROLOGICAL BOWEL NUTRITION STATUS      Continent Diet (See DC summary)  AMBULATORY STATUS COMMUNICATION OF NEEDS Skin   Extensive Assist Verbally Bruising, Skin abrasions                       Personal Care Assistance Level of Assistance  Bathing, Feeding, Dressing Bathing Assistance: Limited assistance Feeding assistance: Limited assistance Dressing Assistance: Limited assistance     Functional Limitations Info  Sight, Hearing, Speech Sight Info: Adequate Hearing Info: Impaired Speech Info: Adequate    SPECIAL CARE FACTORS FREQUENCY  PT (By licensed PT)     PT Frequency: 5 times a week              Contractures      Additional Factors Info  Code Status, Allergies Code Status Info: DNR- Limited Allergies Info: Trazodone, Macrobid           Current Medications (07/29/2023):  This is the current hospital active medication list Current Facility-Administered Medications  Medication Dose Route Frequency Provider Last Rate Last Admin   acetaminophen (TYLENOL) tablet 650 mg  650 mg Oral Q6H PRN Ozella Rocks, MD   650 mg at 07/29/23 0110   Or   acetaminophen (TYLENOL) suppository 650 mg  650 mg Rectal Q6H PRN Ozella Rocks, MD       acidophilus (RISAQUAD) capsule 2 capsule  2 capsule Oral  TID WC Shahmehdi, Seyed A, MD   2 capsule at 07/29/23 1610   ALPRAZolam Prudy Feeler) tablet 0.5 mg  0.5 mg Oral QHS Ozella Rocks, MD   0.5 mg at 07/28/23 2103   alum & mag hydroxide-simeth (MAALOX/MYLANTA) 200-200-20 MG/5ML suspension 15 mL  15 mL Oral Q6H PRN Shahmehdi, Gemma Payor, MD       atorvastatin (LIPITOR) tablet 20 mg  20 mg Oral Daily Ozella Rocks, MD   20 mg at 07/29/23 9604   calcium carbonate (TUMS - dosed in mg elemental calcium) chewable tablet 200 mg of elemental calcium  1 tablet Oral Daily Shahmehdi, Seyed  A, MD   200 mg of elemental calcium at 07/29/23 0929   ceFEPIme (MAXIPIME) 2 g in sodium chloride 0.9 % 100 mL IVPB  2 g Intravenous Q12H Shahmehdi, Seyed A, MD 200 mL/hr at 07/29/23 0934 2 g at 07/29/23 5409   Chlorhexidine Gluconate Cloth 2 % PADS 6 each  6 each Topical Daily Dahal, Melina Schools, MD   6 each at 07/29/23 8119   cyanocobalamin (VITAMIN B12) tablet 1,000 mcg  1,000 mcg Oral Daily Nevin Bloodgood A, MD   1,000 mcg at 07/29/23 1478   dicyclomine (BENTYL) capsule 10 mg  10 mg Oral Q12H PRN Ozella Rocks, MD       docusate sodium (COLACE) capsule 100 mg  100 mg Oral Daily PRN Adefeso, Oladapo, DO   100 mg at 07/28/23 0045   hydrALAZINE (APRESOLINE) injection 10 mg  10 mg Intravenous Q4H PRN Shahmehdi, Seyed A, MD       lactated ringers bolus 1,000 mL  1,000 mL Intravenous Once Elgergawy, Leana Roe, MD   Held at 07/25/23 2025   loperamide (IMODIUM) capsule 2 mg  2 mg Oral PRN Ozella Rocks, MD   2 mg at 07/29/23 2956   methocarbamol (ROBAXIN) injection 500 mg  500 mg Intravenous Q12H PRN Shahmehdi, Seyed A, MD       metoprolol tartrate (LOPRESSOR) tablet 12.5 mg  12.5 mg Oral BID Shahmehdi, Seyed A, MD   12.5 mg at 07/29/23 2130   metroNIDAZOLE (FLAGYL) tablet 500 mg  500 mg Oral BID Nevin Bloodgood A, MD   500 mg at 07/29/23 0929   ondansetron (ZOFRAN) tablet 4 mg  4 mg Oral Q6H PRN Ozella Rocks, MD       Or   ondansetron [facility]) injection 4 mg  4 mg Intravenous Q6H PRN Ozella Rocks, MD       Oral care mouth rinse  15 mL Mouth Rinse PRN Dahal, Melina Schools, MD       potassium & sodium phosphates (PHOS-NAK) 280-160-250 MG packet 1 packet  1 packet Oral TID WC & HS Shahmehdi, Seyed A, MD   1 packet at 07/29/23 8657   sodium chloride flush (NS) 0.9 % injection 10-40 mL  10-40 mL Intracatheter Q12H Shahmehdi, Seyed A, MD   10 mL at 07/29/23 0930   sodium chloride flush (NS) 0.9 % injection 10-40 mL  10-40 mL Intracatheter PRN Kendell Bane, MD         Discharge  Medications: Please see discharge summary for a list of discharge medications.  Relevant Imaging Results:  Relevant Lab Results:   Additional Information SS# [id number]  Catalina Gravel, LCSW

## 2023-07-29 NOTE — Plan of Care (Signed)

## 2023-07-30 ENCOUNTER — Encounter (HOSPITAL_COMMUNITY): Payer: Self-pay | Admitting: Hematology

## 2023-07-30 DIAGNOSIS — A419 Sepsis, unspecified organism: Secondary | ICD-10-CM | POA: Diagnosis not present

## 2023-07-30 LAB — CULTURE, BLOOD (ROUTINE X 2)
Culture: NO GROWTH
Culture: NO GROWTH
Special Requests: ADEQUATE
Special Requests: ADEQUATE

## 2023-07-30 LAB — COMPREHENSIVE METABOLIC PANEL
ALT: 20 U/L (ref 0–44)
AST: 27 U/L (ref 15–41)
Albumin: 2.4 g/dL — ABNORMAL LOW (ref 3.5–5.0)
Alkaline Phosphatase: 63 U/L (ref 38–126)
Anion gap: 7 (ref 5–15)
BUN: 16 mg/dL (ref 8–23)
CO2: 24 mmol/L (ref 22–32)
Calcium: 6.7 mg/dL — ABNORMAL LOW (ref 8.9–10.3)
Chloride: 106 mmol/L (ref 98–111)
Creatinine, Ser: 0.59 mg/dL (ref 0.44–1.00)
GFR, Estimated: >60 mL/min (ref >60)
Glucose, Bld: 110 mg/dL — ABNORMAL HIGH (ref 70–99)
Potassium: 3.3 mmol/L — ABNORMAL LOW (ref 3.5–5.1)
Sodium: 137 mmol/L (ref 135–145)
Total Bilirubin: 1.1 mg/dL (ref 0.0–1.2)
Total Protein: 5.1 g/dL — ABNORMAL LOW (ref 6.5–8.1)

## 2023-07-30 LAB — CBC WITH DIFFERENTIAL/PLATELET
Abs Immature Granulocytes: 0 10*3/uL (ref 0.00–0.07)
Band Neutrophils: 2 %
Basophils Absolute: 0 10*3/uL (ref 0.0–0.1)
Basophils Relative: 0 %
Eosinophils Absolute: 0.1 10*3/uL (ref 0.0–0.5)
Eosinophils Relative: 5 %
HCT: 28.8 % — ABNORMAL LOW (ref 36.0–46.0)
Hemoglobin: 9.3 g/dL — ABNORMAL LOW (ref 12.0–15.0)
Lymphocytes Relative: 49 %
Lymphs Abs: 0.5 10*3/uL — ABNORMAL LOW (ref 0.7–4.0)
MCH: 29.3 pg (ref 26.0–34.0)
MCHC: 32.3 g/dL (ref 30.0–36.0)
MCV: 90.9 fL (ref 80.0–100.0)
Monocytes Absolute: 0.1 10*3/uL (ref 0.1–1.0)
Monocytes Relative: 5 %
Neutro Abs: 0.4 10*3/uL — CL (ref 1.7–7.7)
Neutrophils Relative %: 39 %
Platelets: 50 10*3/uL — ABNORMAL LOW (ref 150–400)
RBC: 3.17 MIL/uL — ABNORMAL LOW (ref 3.87–5.11)
RDW: 17.9 % — ABNORMAL HIGH (ref 11.5–15.5)
WBC: 1 10*3/uL — CL (ref 4.0–10.5)
nRBC: 0 % (ref 0.0–0.2)

## 2023-07-30 MED ORDER — POTASSIUM CHLORIDE CRYS ER 20 MEQ PO TBCR
40.0000 meq | EXTENDED_RELEASE_TABLET | Freq: Once | ORAL | Status: AC
  Administered 2023-07-30: 40 meq via ORAL
  Filled 2023-07-30: qty 2

## 2023-07-30 NOTE — Evaluation (Signed)
 Occupational Therapy Evaluation Patient Details Name: Stephanie Wood MRN: [id number] DOB: [birth year] Today's Date: 07/30/2023   History of Present Illness   a 89 y.o. 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  Patient lives at home and able to take care of her ADLs.  2/11, seen by GI as an outpatient with concern of black stools, poor appetite. Elective EGD was planned for March.  Patient continued to have black stool leading to progressive weakness.    2/24, patient felt dizzy, unsteady and had a fall and hence brought to the ED by EMS from home.     Clinical Impressions Pt agreeable to OT evaluation. Pt was mostly independent at baseline. Today pt required mod to max A for bed mobility and max A for sit to stand with RW. Pt able to take a small step with LE to L side but could not march or take more than one step. Pt demonstrates general B UE weakness with need for much assist for ADL's at this time. Pt left in the bed with family present and NT notified pt needed a bed change. Pt will benefit from continued OT in the hospital and recommended venue below to increase strength, balance, and endurance for safe ADL's.        If plan is discharge home, recommend the following:   A lot of help with walking and/or transfers;A lot of help with bathing/dressing/bathroom;Assistance with cooking/housework;Assist for transportation;Help with stairs or ramp for entrance     Functional Status Assessment   Patient has had a recent decline in their functional status and demonstrates the ability to make significant improvements in function in a reasonable and predictable amount of time.     Equipment Recommendations   None recommended by OT             Precautions/Restrictions   Precautions Precautions: Fall Recall of Precautions/Restrictions: Intact Restrictions Weight  Bearing Restrictions Per Provider Order: No     Mobility Bed Mobility Overal bed mobility: Needs Assistance Bed Mobility: Rolling, Sidelying to Sit, Sit to Supine Rolling: Mod assist Sidelying to sit: Mod assist, Max assist, HOB elevated   Sit to supine: Mod assist, Max assist   General bed mobility comments: labored movement; much assist    Transfers Overall transfer level: Needs assistance Equipment used: Rolling walker (2 wheels) Transfers: Sit to/from Stand Sit to Stand: Max assist           General transfer comment: Standing from EOB with use of RW; able to take a small step to L side with total standing time ~ over 30 seconds.      Balance Overall balance assessment: Needs assistance Sitting-balance support: Bilateral upper extremity supported, Feet supported Sitting balance-Leahy Scale: Fair Sitting balance - Comments: seated at EOB   Standing balance support: Bilateral upper extremity supported, During functional activity, Reliant on assistive device for balance Standing balance-Leahy Scale: Poor Standing balance comment: using RW                           ADL either performed or assessed with clinical judgement   ADL Overall ADL's : Needs assistance/impaired     Grooming: Moderate assistance;Minimal assistance;Sitting   Upper Body Bathing: Minimal assistance;Moderate assistance;Sitting   Lower Body Bathing: Maximal assistance;Sitting/lateral leans   Upper Body Dressing : Minimal assistance;Moderate assistance;Sitting   Lower Body Dressing: Maximal assistance;Sitting/lateral leans Lower  Body Dressing Details (indicate cue type and reason): Max A to don socks seated at [employer] Transfer: Maximal assistance;Rolling walker (2 wheels) Toilet Transfer Details (indicate cue type and reason): Partially simualted via sit to stand from EOB with RW with one step laterally. Toileting- Clothing Manipulation and Hygiene: Maximal assistance;Bed level        Functional mobility during ADLs: Maximal assistance;Rolling walker (2 wheels);Moderate assistance       Vision Baseline Vision/History: 1 Wears glasses Ability to See in Adequate Light: 1 Impaired Patient Visual Report: No change from baseline Vision Assessment?: No apparent visual deficits     Perception Perception: Not tested       Praxis Praxis: Not tested       Pertinent Vitals/Pain Pain Assessment Pain Assessment: Faces Faces Pain Scale: Hurts even more Pain Location: back Pain Descriptors / Indicators: Grimacing, Guarding, Moaning Pain Intervention(s): Limited activity within patient's tolerance, Monitored during session, Repositioned     Extremity/Trunk Assessment Upper Extremity Assessment Upper Extremity Assessment: Generalized weakness   Lower Extremity Assessment Lower Extremity Assessment: Defer to PT evaluation   Cervical / Trunk Assessment Cervical / Trunk Assessment: Kyphotic   Communication Communication Communication: No apparent difficulties   Cognition Arousal: Alert Behavior During Therapy: WFL for tasks assessed/performed Cognition: Cognition impaired (Pt's niece reported that the pt seemed a little confused. Pt did ask which hospital she was in towards the end of session.)   Orientation impairments: Place         OT - Cognition Comments: Possibly mildly confused.                 Following commands: Intact       Cueing  General Comments   Cueing Techniques: Verbal cues                 Home Living Family/patient expects to be discharged to:: Private residence Living Arrangements: Alone Available Help at Discharge: Personal care attendant Type of Home: House Home Access: Ramped entrance     Home Layout: One level     Bathroom Shower/Tub: Tub/shower unit         Home Equipment: Agricultural consultant (2 wheels);Cane - single point;BSC/3in1          Prior Functioning/Environment Prior Level of Function :  Independent/Modified Independent             Mobility Comments: Prior to fall, independent with no AD in household, using cane in community; drives ADLs Comments: Sponge bathing level, independent with other ADLs. Reports aide who helps with cleaning (per PT and patient report.)    OT Problem List: Decreased strength;Decreased activity tolerance;Impaired balance (sitting and/or standing);Decreased cognition;Obesity   OT Treatment/Interventions: Self-care/ADL training;Therapeutic exercise;DME and/or AE instruction;Therapeutic activities;Patient/family education;Balance training;Energy conservation      OT Goals(Current goals can be found in the care plan section)   Acute Rehab OT Goals Patient Stated Goal: improve function OT Goal Formulation: With patient/family Time For Goal Achievement: 08/13/23 Potential to Achieve Goals: Good   OT Frequency:  Min 2X/week                                   End of Session Equipment Utilized During Treatment: Rolling walker (2 wheels);Oxygen;Gait belt Nurse Communication: Mobility status  Activity Tolerance: Patient tolerated treatment well Patient left: in bed;with call bell/phone within reach;with family/visitor present  OT Visit Diagnosis: Unsteadiness on feet (R26.81);Other abnormalities of gait and  mobility (R26.89);Muscle weakness (generalized) (M62.81);History of falling (Z91.81)                Time: 1914-7829 OT Time Calculation (min): 23 min Charges:  OT General Charges $OT Visit: 1 Visit OT Evaluation $OT Eval Low Complexity: 1 Low  Ramie Palladino OT, MOT   Danie Chandler 07/30/2023, 11:46 AM

## 2023-07-30 NOTE — TOC Progression Note (Signed)
 Transition of Care [facility]) - Progression Note    Patient Details  Name: Stephanie Wood MRN: [id number] Date of Birth: 03/26/1935  [facility] CM/SW Contact  Elliot Gault, LCSW Phone Number: 07/30/2023, 4:15 PM  Clinical Narrative:      TOC following. Spoke with dtr to update on SNF placement. Referrals sent out to two other facilities at dtr request. Will follow up in AM with anticipation of dc tomorrow.   Barriers to Discharge: Continued Medical Work up  Expected Discharge Plan and Services                                               Social Determinants of Health (SDOH) Interventions SDOH Screenings   Food Insecurity: No Food Insecurity (07/23/2023)  Housing: Low Risk  (07/23/2023)  Transportation Needs: No Transportation Needs (07/23/2023)  Utilities: Not At Risk (07/23/2023)  Alcohol Screen: Low Risk  (12/20/2022)  Depression (PHQ2-9): Low Risk  (07/02/2023)  Financial Resource Strain: Low Risk  (12/20/2022)  Physical Activity: Sufficiently Active (12/20/2022)  Social Connections: Socially Integrated (07/23/2023)  Stress: No Stress Concern Present (12/20/2022)  Tobacco Use: Low Risk  (07/23/2023)  Health Literacy: Adequate Health Literacy (12/20/2022)    Readmission Risk Interventions     No data to display

## 2023-07-30 NOTE — Plan of Care (Signed)
  Problem: Acute Rehab OT Goals (only OT should resolve) Goal: Pt. Will Perform Grooming Flowsheets (Taken 07/30/2023 1150) Pt Will Perform Grooming:  with contact guard assist  sitting Goal: Pt. Will Perform Upper Body Dressing Flowsheets (Taken 07/30/2023 1150) Pt Will Perform Upper Body Dressing:  with contact guard assist  sitting Goal: Pt. Will Perform Lower Body Dressing Flowsheets (Taken 07/30/2023 1150) Pt Will Perform Lower Body Dressing:  with min assist  with mod assist  sitting/lateral leans Goal: Pt. Will Transfer To Toilet Flowsheets (Taken 07/30/2023 1150) Pt Will Transfer to Toilet:  with min assist  stand pivot transfer Goal: Pt. Will Perform Toileting-Clothing Manipulation Flowsheets (Taken 07/30/2023 1150) Pt Will Perform Toileting - Clothing Manipulation and hygiene: with mod assist Goal: Pt/Caregiver Will Perform Home Exercise Program Flowsheets (Taken 07/30/2023 1150) Pt/caregiver will Perform Home Exercise Program:  Increased strength  Both right and left upper extremity  Independently  Natividad Halls OT, MOT

## 2023-07-30 NOTE — Progress Notes (Signed)
 PROGRESS NOTE    DOLOREZ JEFFREY  UKG:[id number] DOB: 10-06-1934 DOA: 07/23/2023 PCP: Billie Lade, MD   Brief Narrative:    Stephanie Wood is a 89 y.o. 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 Patient lives at home and able to take care of her ADLs. 2/11, seen by GI as an outpatient with concern of black stools, poor appetite. Elective EGD was planned for March. Patient continued to have black stool leading to progressive weakness.   2/24, patient felt dizzy, unsteady and had a fall and hence brought to the ED by EMS from home.  Assessment & Plan:   Principal Problem:   Sepsis (HCC) Active Problems:   GI bleed   Osteoporosis   Gastroesophageal reflux disease   Anxiety state   Pancytopenia (HCC)   Fecal occult blood test positive   Recurrent UTI   Fever, unknown origin   Hyperglycemia  Assessment and Plan:  Sepsis - POA  Septic shock with hypotension   07/29/2023-much improved, hemodynamically stable, resolved sepsis physiology resolved septic shock Antibiotic discontinued-no growth on all cultures.     -07/28/2023, or awake, hemodynamically stable      -POA: and on 07/25/2023 patient met sepsis criteria with, tachypnea, Tmax 103.1, tachycardia -IV fluid was initiated, with 2U PRBC blood transfusion -Broaden antibiotics to vancomycin, cefepime and Flagyl>>> discontinue vancomycin, changing Flagyl to p.o.   -No source of infection identified yet -UA, chest x-ray reviewed -Lactic acid 1.1, 1.2, WBC 1.8,   = For hypotension, started on midodrine orally-D/Ced  = Levophed gtt ordered -D/Ced        -PICC line was placed 12/23/2023 -07/27/2023- S/P Bone marrow biopsy    New onset anemia -Heme positive stool -Due to patient's comorbidities, frailty did not want to pursue any endoscopy at this point Signed off. 07/25/2023-received 2U PRBC blood transfusion    Baseline hemoglobin 12.3 from September 2024, hemoglobin on admission 9.4.  Also has low WBC count and low platelet count FOBT positive but no overtly bleeding  EGD/colonoscopy -deferred due to thrombocytopenia Continue IV Protonix 40 mg twice daily   (Discussed with her son-in-law, daughter and patient at bedside)   Pancytopenia -possibly leukemia   -07/27/2023- S/P Bone marrow biopsy  -S/P 2 U PRBC transfusion, 07/25/2023 -S/P 2 U platelet transfusion on 07/27/23    All cell lines are depressed. Last blood work from September 2024 with normal WBC count, hemoglobin and platelet    Unclear etiology -sepsis related versus possible underlying malignancy     -Blood cultures remain negative - -Discussed with Dr. Ellin Saba -following closely (to be called with any questions) Pending final report from bone marrow biopsy   Acute leukemia with acute reaction is on differential diagnosis Patient is to follow-up as an outpatient       Iron/TIBC/Ferritin/ %Sat Folate 14.4, B12 893, -Continue iron supplements     Thrombocytopenia -Due to presumed GI bleed, anemia -received 2U PRBC -Received 2 units of platelets on 07/26/2023 -Platelets: 35, 30, 29>>> 136 >>79  -No signs of active bleeding       Hypokalemia/hypomagnesemia/hypophosphatemia Oral and IV replacement given. Continue to monitor and replete accordingly     IBS-D At home on Bentyl, Imodium   Lower extremity edema Echo from 2023 showing EF 60% and Grade 1 Diastolic dysfunction. No evidence of fluid overload.  At home on Lasix 20 g daily (20 mg IV was given post blood transfusion  on 2/27)  Currently on hold   Chronic UTI No active complaints. UA relatively unremarkable.  Hold home Methenamine   Anxiety continue home Xanax PRN.    HLD On Statin    Severe debility with falls -PT/OT recommending SNF placement    DVT prophylaxis: SCDs Code Status: DNR Family Communication: None at bedside Disposition Plan:   Status is: Inpatient Remains inpatient appropriate because: Need for IV medications  Consultants:  None  Procedures:  None  Antimicrobials:  Anti-infectives (From admission, onward)    Start     Dose/Rate Route Frequency Ordered Stop   07/28/23 1000  metroNIDAZOLE (FLAGYL) tablet 500 mg  Status:  Discontinued        500 mg Oral 2 times daily 07/28/23 0645 07/29/23 1347   07/26/23 1600  vancomycin (VANCOREADY) IVPB 750 mg/150 mL  Status:  Discontinued        750 mg 150 mL/hr over 60 Minutes Intravenous Every 24 hours 07/26/23 1434 07/28/23 0644   07/25/23 1900  metroNIDAZOLE (FLAGYL) IVPB 500 mg  Status:  Discontinued        500 mg 100 mL/hr over 60 Minutes Intravenous Every 12 hours 07/25/23 1559 07/28/23 0658   07/25/23 1900  ceFEPIme (MAXIPIME) 2 g in sodium chloride 0.9 % 100 mL IVPB  Status:  Discontinued        2 g 200 mL/hr over 30 Minutes Intravenous Every 12 hours 07/25/23 1619 07/29/23 1347   07/25/23 1700  ceFEPIme (MAXIPIME) 2 g in sodium chloride 0.9 % 100 mL IVPB  Status:  Discontinued        2 g 200 mL/hr over 30 Minutes Intravenous Every 12 hours 07/25/23 1554 07/25/23 1619   07/25/23 1200  vancomycin (VANCOREADY) IVPB 750 mg/150 mL  Status:  Discontinued        750 mg 150 mL/hr over 60 Minutes Intravenous Every 24 hours 07/24/23 1121 07/26/23 1434   07/24/23 1015  cefTRIAXone (ROCEPHIN) 2 g in sodium chloride 0.9 % 100 mL IVPB  Status:  Discontinued        2 g 200 mL/hr over 30 Minutes Intravenous Every 24 hours 07/24/23 0918 07/25/23 1554   07/24/23 1015  vancomycin (VANCOREADY) IVPB 1500 mg/300 mL        1,500 mg 150 mL/hr over 120 Minutes Intravenous  Once 07/24/23 2956 07/24/23 1305      Subjective: Patient seen and evaluated today with no new acute complaints or concerns. No acute concerns or events noted overnight.  Objective: Vitals:   07/29/23 2200 07/29/23 2308 07/30/23 0337 07/30/23 0817  BP: (!) 151/73 123/79 125/69 (!) 146/90  Pulse: (!) 107  80 92   Resp:  19 18   Temp:  98.1 F (36.7 C) 98.3 F (36.8 C) 98.8 F (37.1 C)  TempSrc:  Axillary Oral Oral  SpO2:  96% 97%   Weight:      Height:        Intake/Output Summary (Last 24 hours) at 07/30/2023 1058 Last data filed at 07/30/2023 0824 Gross per 24 hour  Intake 0 ml  Output 750 ml  Net -750 ml   Filed Weights   07/23/23 0810 07/24/23 1144 07/26/23 0508  Weight: 68.8 kg 70.9 kg 71.2 kg    Examination:  General exam: Appears calm and comfortable  Respiratory system: Clear to auscultation. Respiratory effort normal.  Nasal cannula 4 L Cardiovascular system: S1 & S2 heard, RRR.  Gastrointestinal system: Abdomen is soft Central nervous system: Alert and awake Extremities:  No edema Skin: No significant lesions noted Psychiatry: Flat affect.    Data Reviewed: I have personally reviewed following labs and imaging studies  CBC: Recent Labs  Lab 07/26/23 0338 07/26/23 1131 07/27/23 0251 07/27/23 1835 07/28/23 0236 07/28/23 1111 07/29/23 0530 07/30/23 0447  WBC 1.8*  --  1.5*  --  1.0*  --  0.9* 1.0*  NEUTROABS 1.3*  --  1.1*  --  0.6*  --  0.5* 0.4*  HGB 9.6*   < > 9.4* 9.0* 9.5* 10.0* 9.0* 9.3*  HCT 28.5*   < > 28.1* 27.1* 28.3* 31.0* 27.2* 28.8*  MCV 89.3  --  90.1  --  89.6  --  90.1 90.9  PLT 29*  --  136*  --  111*  --  79* 50*   < > = values in this interval not displayed.   Basic Metabolic Panel: Recent Labs  Lab 07/25/23 1231 07/26/23 0338 07/27/23 0251 07/27/23 0800 07/28/23 0236 07/28/23 1111 07/29/23 0530 07/30/23 0447  NA  --  132* 135  --  136  --  138 137  K  --  4.1 4.0  --  3.8  --  3.3* 3.3*  CL  --  106 107  --  110  --  113* 106  CO2  --  17* 20*  --  20*  --  22 24  GLUCOSE  --  122* 146*  --  145*  --  113* 110*  BUN  --  17 25*  --  25*  --  20 16  CREATININE  --  0.95 0.88  --  0.79  --  0.63 0.59  CALCIUM  --  6.6* 6.7*  --  6.8*  --  6.8* 6.7*  MG 1.4* 1.5*  --  2.1  --  2.2  --   --   PHOS 1.2* 1.6*  --   --   --    --   --   --    GFR: Estimated Creatinine Clearance: 48.8 mL/min (by C-G formula based on SCr of 0.59 mg/dL). Liver Function Tests: Recent Labs  Lab 07/24/23 0526 07/24/23 0927 07/27/23 0800 07/28/23 0236 07/29/23 0530 07/30/23 0447  AST 31  --   --  32 35 27  ALT 16  --   --  20 20 20   ALKPHOS 81  --   --  67 57 63  BILITOT 1.4* 1.2  --  1.2 1.2 1.1  PROT 6.0*  --   --  5.3* 5.3* 5.1*  ALBUMIN 2.5*  --  2.1* 2.1* 2.5* 2.4*   No results for input(s): "LIPASE", "AMYLASE" in the last 168 hours. No results for input(s): "AMMONIA" in the last 168 hours. Coagulation Profile: No results for input(s): "INR", "PROTIME" in the last 168 hours. Cardiac Enzymes: No results for input(s): "CKTOTAL", "CKMB", "CKMBINDEX", "TROPONINI" in the last 168 hours. BNP (last 3 results) No results for input(s): "PROBNP" in the last 8760 hours. HbA1C: No results for input(s): "HGBA1C" in the last 72 hours. CBG: No results for input(s): "GLUCAP" in the last 168 hours. Lipid Profile: No results for input(s): "CHOL", "HDL", "LDLCALC", "TRIG", "CHOLHDL", "LDLDIRECT" in the last 72 hours. Thyroid Function Tests: No results for input(s): "TSH", "T4TOTAL", "FREET4", "T3FREE", "THYROIDAB" in the last 72 hours. Anemia Panel: No results for input(s): "VITAMINB12", "FOLATE", "FERRITIN", "TIBC", "IRON", "RETICCTPCT" in the last 72 hours. Sepsis Labs: Recent Labs  Lab 07/24/23 0927 07/25/23 1231 07/26/23 0338  PROCALCITON  --  24.55 21.90  LATICACIDVEN 1.1 1.2  --     Recent Results (from the past 240 hours)  Resp panel by RT-PCR (RSV, Flu A&B, Covid) Anterior Nasal Swab     Status: None   Collection Time: 07/23/23 12:02 PM   Specimen: Anterior Nasal Swab  Result Value Ref Range Status   SARS Coronavirus 2 by RT PCR NEGATIVE NEGATIVE Final    Comment: (NOTE) SARS-CoV-2 target nucleic acids are NOT DETECTED.  The SARS-CoV-2 RNA is generally detectable in upper respiratory specimens during the acute  phase of infection. The lowest concentration of SARS-CoV-2 viral copies this assay can detect is 138 copies/mL. A negative result does not preclude SARS-Cov-2 infection and should not be used as the sole basis for treatment or other patient management decisions. A negative result may occur with  improper specimen collection/handling, submission of specimen other than nasopharyngeal swab, presence of viral mutation(s) within the areas targeted by this assay, and inadequate number of viral copies(<138 copies/mL). A negative result must be combined with clinical observations, patient history, and epidemiological information. The expected result is Negative.  Fact Sheet for Patients:  BloggerCourse.com  Fact Sheet for Healthcare Providers:  SeriousBroker.it  This test is no t yet approved or cleared by the [place] FDA and  has been authorized for detection and/or diagnosis of SARS-CoV-2 by FDA under an Emergency Use Authorization (EUA). This EUA will remain  in effect (meaning this test can be used) for the duration of the COVID-19 declaration under Section 564(b)(1) of the Act, 21 U.S.C.section 360bbb-3(b)(1), unless the authorization is terminated  or revoked sooner.       Influenza A by PCR NEGATIVE NEGATIVE Final   Influenza B by PCR NEGATIVE NEGATIVE Final    Comment: (NOTE) The Xpert Xpress SARS-CoV-2/FLU/RSV plus assay is intended as an aid in the diagnosis of influenza from Nasopharyngeal swab specimens and should not be used as a sole basis for treatment. Nasal washings and aspirates are unacceptable for Xpert Xpress SARS-CoV-2/FLU/RSV testing.  Fact Sheet for Patients: BloggerCourse.com  Fact Sheet for Healthcare Providers: SeriousBroker.it  This test is not yet approved or cleared by the [place] FDA and has been authorized for detection and/or diagnosis of  SARS-CoV-2 by FDA under an Emergency Use Authorization (EUA). This EUA will remain in effect (meaning this test can be used) for the duration of the COVID-19 declaration under Section 564(b)(1) of the Act, 21 U.S.C. section 360bbb-3(b)(1), unless the authorization is terminated or revoked.     Resp Syncytial Virus by PCR NEGATIVE NEGATIVE Final    Comment: (NOTE) Fact Sheet for Patients: BloggerCourse.com  Fact Sheet for Healthcare Providers: SeriousBroker.it  This test is not yet approved or cleared by the [place] FDA and has been authorized for detection and/or diagnosis of SARS-CoV-2 by FDA under an Emergency Use Authorization (EUA). This EUA will remain in effect (meaning this test can be used) for the duration of the COVID-19 declaration under Section 564(b)(1) of the Act, 21 U.S.C. section 360bbb-3(b)(1), unless the authorization is terminated or revoked.  Performed at [facility], [address], [place], [place] [address]   Culture, blood (Routine X 2) w Reflex to ID Panel     Status: None   Collection Time: 07/24/23  9:27 AM   Specimen: BLOOD  Result Value Ref Range Status   Specimen Description BLOOD BLOOD LEFT HAND  Final   Special Requests   Final    BOTTLES DRAWN AEROBIC AND ANAEROBIC Blood Culture adequate  volume   Culture   Final    NO GROWTH 5 DAYS Performed at [facility], [address] [place], [place] [address]    Report Status 07/29/2023 FINAL  Final  Culture, blood (Routine X 2) w Reflex to ID Panel     Status: None   Collection Time: 07/24/23  9:27 AM   Specimen: BLOOD  Result Value Ref Range Status   Specimen Description BLOOD BLOOD RIGHT HAND  Final   Special Requests   Final    BOTTLES DRAWN AEROBIC AND ANAEROBIC Blood Culture adequate volume   Culture   Final    NO GROWTH 5 DAYS Performed at [facility], [address], [place], [place] [address]    Report Status 07/29/2023 FINAL  Final   MRSA Next Gen by PCR, Nasal     Status: None   Collection Time: 07/24/23 11:45 AM   Specimen: Nasal Mucosa; Nasal Swab  Result Value Ref Range Status   MRSA by PCR Next Gen NOT DETECTED NOT DETECTED Final    Comment: (NOTE) The GeneXpert MRSA Assay (FDA approved for NASAL specimens only), is one component of a comprehensive MRSA colonization surveillance program. It is not intended to diagnose MRSA infection nor to guide or monitor treatment for MRSA infections. Test performance is not FDA approved in patients less than 24 years old. Performed at [facility], [address] [place], [place] [address]   Culture, blood (Routine X 2) w Reflex to ID Panel     Status: None   Collection Time: 07/25/23 12:31 PM   Specimen: BLOOD  Result Value Ref Range Status   Specimen Description BLOOD RIGHT ANTECUBITAL  Final   Special Requests   Final    BOTTLES DRAWN AEROBIC AND ANAEROBIC Blood Culture adequate volume   Culture   Final    NO GROWTH 5 DAYS Performed at [facility], [address] [place], [place] [address]    Report Status 07/30/2023 FINAL  Final  Culture, blood (Routine X 2) w Reflex to ID Panel     Status: None   Collection Time: 07/25/23 12:37 PM   Specimen: BLOOD  Result Value Ref Range Status   Specimen Description BLOOD LEFT ANTECUBITAL  Final   Special Requests   Final    BOTTLES DRAWN AEROBIC AND ANAEROBIC Blood Culture adequate volume   Culture   Final    NO GROWTH 5 DAYS Performed at [facility], [address] [place], [place] [address]    Report Status 07/30/2023 FINAL  Final         Radiology Studies: No results found.      Scheduled Meds:  acidophilus  2 capsule Oral TID WC   ALPRAZolam  0.5 mg Oral QHS   amLODipine  5 mg Oral Daily   atorvastatin  20 mg Oral Daily   calcium carbonate  1 tablet Oral Daily   Chlorhexidine Gluconate Cloth  6 each Topical Daily   cyanocobalamin  1,000 mcg Oral Daily   metoprolol tartrate  50 mg Oral BID    potassium & sodium phosphates  1 packet Oral TID WC & HS   sodium chloride flush  10-40 mL Intracatheter Q12H   Continuous Infusions:  lactated ringers Stopped (07/25/23 2025)     LOS: 7 days    Time spent: 55 minutes    Griffon Herberg D Sherryll Burger, DO [facility]  If 7PM-7AM, please contact night-coverage [email or web address] 07/30/2023, 10:58 AM

## 2023-07-31 ENCOUNTER — Encounter (HOSPITAL_COMMUNITY): Payer: PPO

## 2023-07-31 DIAGNOSIS — D61818 Other pancytopenia: Secondary | ICD-10-CM | POA: Diagnosis not present

## 2023-07-31 DIAGNOSIS — C9200 Acute myeloblastic leukemia, not having achieved remission: Secondary | ICD-10-CM | POA: Diagnosis not present

## 2023-07-31 DIAGNOSIS — A419 Sepsis, unspecified organism: Secondary | ICD-10-CM | POA: Diagnosis not present

## 2023-07-31 DIAGNOSIS — R509 Fever, unspecified: Secondary | ICD-10-CM | POA: Diagnosis not present

## 2023-07-31 LAB — SURGICAL PATHOLOGY

## 2023-07-31 LAB — CBC WITH DIFFERENTIAL/PLATELET
Abs Immature Granulocytes: 0 10*3/uL (ref 0.00–0.07)
Band Neutrophils: 8 %
Basophils Absolute: 0 10*3/uL (ref 0.0–0.1)
Basophils Relative: 3 %
Eosinophils Absolute: 0 10*3/uL (ref 0.0–0.5)
Eosinophils Relative: 4 %
HCT: 27 % — ABNORMAL LOW (ref 36.0–46.0)
Hemoglobin: 8.7 g/dL — ABNORMAL LOW (ref 12.0–15.0)
Lymphocytes Relative: 47 %
Lymphs Abs: 0.5 10*3/uL — ABNORMAL LOW (ref 0.7–4.0)
MCH: 29.5 pg (ref 26.0–34.0)
MCHC: 32.2 g/dL (ref 30.0–36.0)
MCV: 91.5 fL (ref 80.0–100.0)
Monocytes Absolute: 0.1 10*3/uL (ref 0.1–1.0)
Monocytes Relative: 6 %
Neutro Abs: 0.4 10*3/uL — CL (ref 1.7–7.7)
Neutrophils Relative %: 32 %
Platelets: 34 10*3/uL — ABNORMAL LOW (ref 150–400)
RBC: 2.95 MIL/uL — ABNORMAL LOW (ref 3.87–5.11)
RDW: 17.5 % — ABNORMAL HIGH (ref 11.5–15.5)
Smear Review: DECREASED
WBC: 1 10*3/uL — CL (ref 4.0–10.5)
nRBC: 0 % (ref 0.0–0.2)
nRBC: 1 /100{WBCs} — ABNORMAL HIGH

## 2023-07-31 LAB — BASIC METABOLIC PANEL
Anion gap: 5 (ref 5–15)
BUN: 13 mg/dL (ref 8–23)
CO2: 27 mmol/L (ref 22–32)
Calcium: 6.5 mg/dL — ABNORMAL LOW (ref 8.9–10.3)
Chloride: 106 mmol/L (ref 98–111)
Creatinine, Ser: 0.61 mg/dL (ref 0.44–1.00)
GFR, Estimated: >60 mL/min (ref >60)
Glucose, Bld: 96 mg/dL (ref 70–99)
Potassium: 3.5 mmol/L (ref 3.5–5.1)
Sodium: 138 mmol/L (ref 135–145)

## 2023-07-31 LAB — MAGNESIUM: Magnesium: 1.8 mg/dL (ref 1.7–2.4)

## 2023-07-31 MED ORDER — ENSURE ENLIVE PO LIQD
237.0000 mL | Freq: Two times a day (BID) | ORAL | Status: DC
  Administered 2023-08-01: 237 mL via ORAL

## 2023-07-31 NOTE — Progress Notes (Signed)
 PROGRESS NOTE    Stephanie Wood  WUJ:[id number] DOB: [birth year] DOA: 07/23/2023 PCP: Billie Lade, MD   Brief Narrative:    Stephanie Wood is a 89 y.o. 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 Patient lives at home and able to take care of her ADLs. 2/11, seen by GI as an outpatient with concern of black stools, poor appetite. Elective EGD was planned for March. Patient continued to have black stool leading to progressive weakness.   2/24, patient felt dizzy, unsteady and had a fall and hence brought to the ED by EMS from home. Patient has had bone marrow biopsy performed for evaluation of pancytopenia that may be positive for AML.  Dr. Ellin Saba investigating further and will have discussion with daughter in regards to further plans for treatment 3/4.  Assessment & Plan:   Principal Problem:   Sepsis (HCC) Active Problems:   GI bleed   Osteoporosis   Gastroesophageal reflux disease   Anxiety state   Pancytopenia (HCC)   Fecal occult blood test positive   Recurrent UTI   Fever, unknown origin   Hyperglycemia  Assessment and Plan:  Sepsis - POA  Septic shock with hypotension   07/29/2023-much improved, hemodynamically stable, resolved sepsis physiology resolved septic shock Antibiotic discontinued-no growth on all cultures.     -07/28/2023, or awake, hemodynamically stable      -POA: and on 07/25/2023 patient met sepsis criteria with, tachypnea, Tmax 103.1, tachycardia -IV fluid was initiated, with 2U PRBC blood transfusion -Broaden antibiotics to vancomycin, cefepime and Flagyl>>> discontinue vancomycin, changing Flagyl to p.o.   -No source of infection identified yet -UA, chest x-ray reviewed -Lactic acid 1.1, 1.2, WBC 1.8,   = For hypotension, started on midodrine orally-D/Ced  = Levophed gtt ordered -D/Ced        -PICC line was placed  12/23/2023 -07/27/2023- S/P Bone marrow biopsy    New onset anemia -Heme positive stool -Due to patient's comorbidities, frailty did not want to pursue any endoscopy at this point Signed off. 07/25/2023-received 2U PRBC blood transfusion   Baseline hemoglobin 12.3 from September 2024, hemoglobin on admission 9.4.  Also has low WBC count and low platelet count FOBT positive but no overtly bleeding  EGD/colonoscopy -deferred due to thrombocytopenia Continue IV Protonix 40 mg twice daily   (Discussed with her son-in-law, daughter and patient at bedside)   Pancytopenia -possibly leukemia   -07/27/2023- S/P Bone marrow biopsy  -S/P 2 U PRBC transfusion, 07/25/2023 -S/P 2 U platelet transfusion on 07/27/23    All cell lines are depressed. Last blood work from September 2024 with normal WBC count, hemoglobin and platelet    Unclear etiology -sepsis related versus possible underlying malignancy     -Blood cultures remain negative - -concerns for AML noted, Dr. Ellin Saba to address with daughter and patient regarding further treatment options if desired.       Iron/TIBC/Ferritin/ %Sat Folate 14.4, B12 893, -Continue iron supplements     Thrombocytopenia-worsening -Due to presumed GI bleed, anemia -received 2U PRBC -Received 2 units of platelets on 07/26/2023 -Platelets: 35, 30, 29>>> 136 >>79>>34 -No signs of active bleeding -May require further transfusion of platelets       Hypokalemia/hypomagnesemia/hypophosphatemia Oral and IV replacement given. Continue to monitor and replete accordingly     IBS-D At home on Bentyl, Imodium   Lower extremity edema Echo from 2023 showing EF 60% and Grade 1 Diastolic  dysfunction. No evidence of fluid overload.  At home on Lasix 20 g daily (20 mg IV was given post blood transfusion on 2/27)  Currently on hold   Chronic UTI No active complaints. UA relatively unremarkable.  Hold home Methenamine   Anxiety continue home Xanax PRN.     HLD On Statin    Severe debility with falls -PT/OT recommending SNF placement    DVT prophylaxis: SCDs Code Status: DNR Family Communication: None at bedside Disposition Plan:  Status is: Inpatient Remains inpatient appropriate because: Need for IV medications  Consultants:  Spoke with Dr. Ellin Saba 3/4  Procedures:  None  Antimicrobials:  Anti-infectives (From admission, onward)    Start     Dose/Rate Route Frequency Ordered Stop   07/28/23 1000  metroNIDAZOLE (FLAGYL) tablet 500 mg  Status:  Discontinued        500 mg Oral 2 times daily 07/28/23 0645 07/29/23 1347   07/26/23 1600  vancomycin (VANCOREADY) IVPB 750 mg/150 mL  Status:  Discontinued        750 mg 150 mL/hr over 60 Minutes Intravenous Every 24 hours 07/26/23 1434 07/28/23 0644   07/25/23 1900  metroNIDAZOLE (FLAGYL) IVPB 500 mg  Status:  Discontinued        500 mg 100 mL/hr over 60 Minutes Intravenous Every 12 hours 07/25/23 1559 07/28/23 0658   07/25/23 1900  ceFEPIme (MAXIPIME) 2 g in sodium chloride 0.9 % 100 mL IVPB  Status:  Discontinued        2 g 200 mL/hr over 30 Minutes Intravenous Every 12 hours 07/25/23 1619 07/29/23 1347   07/25/23 1700  ceFEPIme (MAXIPIME) 2 g in sodium chloride 0.9 % 100 mL IVPB  Status:  Discontinued        2 g 200 mL/hr over 30 Minutes Intravenous Every 12 hours 07/25/23 1554 07/25/23 1619   07/25/23 1200  vancomycin (VANCOREADY) IVPB 750 mg/150 mL  Status:  Discontinued        750 mg 150 mL/hr over 60 Minutes Intravenous Every 24 hours 07/24/23 1121 07/26/23 1434   07/24/23 1015  cefTRIAXone (ROCEPHIN) 2 g in sodium chloride 0.9 % 100 mL IVPB  Status:  Discontinued        2 g 200 mL/hr over 30 Minutes Intravenous Every 24 hours 07/24/23 0918 07/25/23 1554   07/24/23 1015  vancomycin (VANCOREADY) IVPB 1500 mg/300 mL        1,500 mg 150 mL/hr over 120 Minutes Intravenous  Once 07/24/23 9528 07/24/23 1305      Subjective: Patient seen and evaluated today with no new  acute complaints or concerns. No acute concerns or events noted overnight.  Objective: Vitals:   07/30/23 0817 07/30/23 1202 07/30/23 1916 07/31/23 0410  BP: (!) 146/90     Pulse:      Resp:      Temp: 98.8 F (37.1 C) 98.5 F (36.9 C) 98 F (36.7 C) 98.4 F (36.9 C)  TempSrc: Oral Oral Oral Axillary  SpO2:      Weight:      Height:        Intake/Output Summary (Last 24 hours) at 07/31/2023 1526 Last data filed at 07/31/2023 0900 Gross per 24 hour  Intake --  Output 2375 ml  Net -2375 ml   Filed Weights   07/23/23 0810 07/24/23 1144 07/26/23 0508  Weight: 68.8 kg 70.9 kg 71.2 kg    Examination:  General exam: Appears calm and comfortable  Respiratory system: Clear to auscultation. Respiratory effort  normal.  Nasal cannula 4 L Cardiovascular system: S1 & S2 heard, RRR.  Gastrointestinal system: Abdomen is soft Central nervous system: Alert and awake Extremities: No edema Skin: No significant lesions noted Psychiatry: Flat affect.    Data Reviewed: I have personally reviewed following labs and imaging studies  CBC: Recent Labs  Lab 07/27/23 0251 07/27/23 1835 07/28/23 0236 07/28/23 1111 07/29/23 0530 07/30/23 0447 07/31/23 0440  WBC 1.5*  --  1.0*  --  0.9* 1.0* 1.0*  NEUTROABS 1.1*  --  0.6*  --  0.5* 0.4* 0.4*  HGB 9.4*   < > 9.5* 10.0* 9.0* 9.3* 8.7*  HCT 28.1*   < > 28.3* 31.0* 27.2* 28.8* 27.0*  MCV 90.1  --  89.6  --  90.1 90.9 91.5  PLT 136*  --  111*  --  79* 50* 34*   < > = values in this interval not displayed.   Basic Metabolic Panel: Recent Labs  Lab 07/25/23 1231 07/26/23 0338 07/27/23 0251 07/27/23 0800 07/28/23 0236 07/28/23 1111 07/29/23 0530 07/30/23 0447 07/31/23 0440  NA  --  132* 135  --  136  --  138 137 138  K  --  4.1 4.0  --  3.8  --  3.3* 3.3* 3.5  CL  --  106 107  --  110  --  113* 106 106  CO2  --  17* 20*  --  20*  --  22 24 27   GLUCOSE  --  122* 146*  --  145*  --  113* 110* 96  BUN  --  17 25*  --  25*  --  20 16  13   CREATININE  --  0.95 0.88  --  0.79  --  0.63 0.59 0.61  CALCIUM  --  6.6* 6.7*  --  6.8*  --  6.8* 6.7* 6.5*  MG 1.4* 1.5*  --  2.1  --  2.2  --   --  1.8  PHOS 1.2* 1.6*  --   --   --   --   --   --   --    GFR: Estimated Creatinine Clearance: 48.8 mL/min (by C-G formula based on SCr of 0.61 mg/dL). Liver Function Tests: Recent Labs  Lab 07/27/23 0800 07/28/23 0236 07/29/23 0530 07/30/23 0447  AST  --  32 35 27  ALT  --  20 20 20   ALKPHOS  --  67 57 63  BILITOT  --  1.2 1.2 1.1  PROT  --  5.3* 5.3* 5.1*  ALBUMIN 2.1* 2.1* 2.5* 2.4*   No results for input(s): "LIPASE", "AMYLASE" in the last 168 hours. No results for input(s): "AMMONIA" in the last 168 hours. Coagulation Profile: No results for input(s): "INR", "PROTIME" in the last 168 hours. Cardiac Enzymes: No results for input(s): "CKTOTAL", "CKMB", "CKMBINDEX", "TROPONINI" in the last 168 hours. BNP (last 3 results) No results for input(s): "PROBNP" in the last 8760 hours. HbA1C: No results for input(s): "HGBA1C" in the last 72 hours. CBG: No results for input(s): "GLUCAP" in the last 168 hours. Lipid Profile: No results for input(s): "CHOL", "HDL", "LDLCALC", "TRIG", "CHOLHDL", "LDLDIRECT" in the last 72 hours. Thyroid Function Tests: No results for input(s): "TSH", "T4TOTAL", "FREET4", "T3FREE", "THYROIDAB" in the last 72 hours. Anemia Panel: No results for input(s): "VITAMINB12", "FOLATE", "FERRITIN", "TIBC", "IRON", "RETICCTPCT" in the last 72 hours. Sepsis Labs: Recent Labs  Lab 07/25/23 1231 07/26/23 0338  PROCALCITON 24.55 21.90  LATICACIDVEN 1.2  --  Recent Results (from the past 240 hours)  Resp panel by RT-PCR (RSV, Flu A&B, Covid) Anterior Nasal Swab     Status: None   Collection Time: 07/23/23 12:02 PM   Specimen: Anterior Nasal Swab  Result Value Ref Range Status   SARS Coronavirus 2 by RT PCR NEGATIVE NEGATIVE Final    Comment: (NOTE) SARS-CoV-2 target nucleic acids are NOT  DETECTED.  The SARS-CoV-2 RNA is generally detectable in upper respiratory specimens during the acute phase of infection. The lowest concentration of SARS-CoV-2 viral copies this assay can detect is 138 copies/mL. A negative result does not preclude SARS-Cov-2 infection and should not be used as the sole basis for treatment or other patient management decisions. A negative result may occur with  improper specimen collection/handling, submission of specimen other than nasopharyngeal swab, presence of viral mutation(s) within the areas targeted by this assay, and inadequate number of viral copies(<138 copies/mL). A negative result must be combined with clinical observations, patient history, and epidemiological information. The expected result is Negative.  Fact Sheet for Patients:  BloggerCourse.com  Fact Sheet for Healthcare Providers:  SeriousBroker.it  This test is no t yet approved or cleared by the [place] FDA and  has been authorized for detection and/or diagnosis of SARS-CoV-2 by FDA under an Emergency Use Authorization (EUA). This EUA will remain  in effect (meaning this test can be used) for the duration of the COVID-19 declaration under Section 564(b)(1) of the Act, 21 U.S.C.section 360bbb-3(b)(1), unless the authorization is terminated  or revoked sooner.       Influenza A by PCR NEGATIVE NEGATIVE Final   Influenza B by PCR NEGATIVE NEGATIVE Final    Comment: (NOTE) The Xpert Xpress SARS-CoV-2/FLU/RSV plus assay is intended as an aid in the diagnosis of influenza from Nasopharyngeal swab specimens and should not be used as a sole basis for treatment. Nasal washings and aspirates are unacceptable for Xpert Xpress SARS-CoV-2/FLU/RSV testing.  Fact Sheet for Patients: BloggerCourse.com  Fact Sheet for Healthcare Providers: SeriousBroker.it  This test is not yet  approved or cleared by the [place] FDA and has been authorized for detection and/or diagnosis of SARS-CoV-2 by FDA under an Emergency Use Authorization (EUA). This EUA will remain in effect (meaning this test can be used) for the duration of the COVID-19 declaration under Section 564(b)(1) of the Act, 21 U.S.C. section 360bbb-3(b)(1), unless the authorization is terminated or revoked.     Resp Syncytial Virus by PCR NEGATIVE NEGATIVE Final    Comment: (NOTE) Fact Sheet for Patients: BloggerCourse.com  Fact Sheet for Healthcare Providers: SeriousBroker.it  This test is not yet approved or cleared by the [place] FDA and has been authorized for detection and/or diagnosis of SARS-CoV-2 by FDA under an Emergency Use Authorization (EUA). This EUA will remain in effect (meaning this test can be used) for the duration of the COVID-19 declaration under Section 564(b)(1) of the Act, 21 U.S.C. section 360bbb-3(b)(1), unless the authorization is terminated or revoked.  Performed at [facility], [address] [place], [place] [address]   Culture, blood (Routine X 2) w Reflex to ID Panel     Status: None   Collection Time: 07/24/23  9:27 AM   Specimen: BLOOD  Result Value Ref Range Status   Specimen Description BLOOD BLOOD LEFT HAND  Final   Special Requests   Final    BOTTLES DRAWN AEROBIC AND ANAEROBIC Blood Culture adequate volume   Culture   Final    NO GROWTH  5 DAYS Performed at [facility], [address], [place], [place] [address]    Report Status 07/29/2023 FINAL  Final  Culture, blood (Routine X 2) w Reflex to ID Panel     Status: None   Collection Time: 07/24/23  9:27 AM   Specimen: BLOOD  Result Value Ref Range Status   Specimen Description BLOOD BLOOD RIGHT HAND  Final   Special Requests   Final    BOTTLES DRAWN AEROBIC AND ANAEROBIC Blood Culture adequate volume   Culture   Final    NO GROWTH 5  DAYS Performed at [facility], [address], [place], [place] [address]    Report Status 07/29/2023 FINAL  Final  MRSA Next Gen by PCR, Nasal     Status: None   Collection Time: 07/24/23 11:45 AM   Specimen: Nasal Mucosa; Nasal Swab  Result Value Ref Range Status   MRSA by PCR Next Gen NOT DETECTED NOT DETECTED Final    Comment: (NOTE) The GeneXpert MRSA Assay (FDA approved for NASAL specimens only), is one component of a comprehensive MRSA colonization surveillance program. It is not intended to diagnose MRSA infection nor to guide or monitor treatment for MRSA infections. Test performance is not FDA approved in patients less than 32 years old. Performed at [facility], [address], [place], [place] [address]   Culture, blood (Routine X 2) w Reflex to ID Panel     Status: None   Collection Time: 07/25/23 12:31 PM   Specimen: BLOOD  Result Value Ref Range Status   Specimen Description BLOOD RIGHT ANTECUBITAL  Final   Special Requests   Final    BOTTLES DRAWN AEROBIC AND ANAEROBIC Blood Culture adequate volume   Culture   Final    NO GROWTH 5 DAYS Performed at [facility], [address] [place], [place] [address]    Report Status 07/30/2023 FINAL  Final  Culture, blood (Routine X 2) w Reflex to ID Panel     Status: None   Collection Time: 07/25/23 12:37 PM   Specimen: BLOOD  Result Value Ref Range Status   Specimen Description BLOOD LEFT ANTECUBITAL  Final   Special Requests   Final    BOTTLES DRAWN AEROBIC AND ANAEROBIC Blood Culture adequate volume   Culture   Final    NO GROWTH 5 DAYS Performed at [facility], [address] [place], [place] [address]    Report Status 07/30/2023 FINAL  Final         Radiology Studies: No results found.      Scheduled Meds:  acidophilus  2 capsule Oral TID WC   ALPRAZolam  0.5 mg Oral QHS   amLODipine  5 mg Oral Daily   atorvastatin  20 mg Oral Daily   calcium carbonate  1 tablet Oral Daily   Chlorhexidine  Gluconate Cloth  6 each Topical Daily   cyanocobalamin  1,000 mcg Oral Daily   metoprolol tartrate  50 mg Oral BID   potassium & sodium phosphates  1 packet Oral TID WC & HS   sodium chloride flush  10-40 mL Intracatheter Q12H   Continuous Infusions:  lactated ringers Stopped (07/25/23 2025)     LOS: 8 days    Time spent: 55 minutes    Angi Goodell D Sherryll Burger, DO [facility]  If 7PM-7AM, please contact night-coverage [email or web address] 07/31/2023, 3:26 PM

## 2023-07-31 NOTE — Care Management Important Message (Signed)
 Important Message  Patient Details  Name: Stephanie Wood MRN: [id number] Date of Birth: 1935-03-14   Important Message Given:  Yes - Medicare IM     Corey Harold 07/31/2023, 1:22 PM

## 2023-07-31 NOTE — Consult Note (Signed)
 [facility] Oncology Progress Note  Name: Stephanie Wood      MRN: [id number]    Location: A203/A203-01  Date: 07/31/2023 Time:4:48 PM   Subjective: Interval History:Stephanie Wood seen by me today for follow-up of pancytopenia.  She had bone marrow biopsy done on Friday.  Her daughter Stephanie Wood and son-in-law at bedside.  Objective: Vital signs in last 24 hours: Temp:  [98 F (36.7 C)-98.4 F (36.9 C)] 98.4 F (36.9 C) (03/04 0410)    Intake/Output from previous day: 03/03 0800 - 03/04 0759 In: 0  Out: 1800 [Urine:1800]    Intake/Output this shift: Total I/O In: -  Out: 575 [Urine:575]   PHYSICAL EXAM: BP (!) 146/90 (BP Location: Right Arm)   Pulse 92   Temp 98.4 F (36.9 C) (Axillary)   Resp 18   Ht 5\' 4"  (1.626 m)   Wt 156 lb 15.5 oz (71.2 kg)   SpO2 97%   BMI 26.94 kg/m  General appearance: alert, cooperative, and appears stated age Extremities:  No edema or cyanosis   Studies/Results: Results for orders placed or performed during the hospital encounter of 07/23/23 (from the past 48 hours)  Comprehensive metabolic panel     Status: Abnormal   Collection Time: 07/30/23  4:47 AM  Result Value Ref Range   Sodium 137 135 - 145 mmol/L   Potassium 3.3 (L) 3.5 - 5.1 mmol/L   Chloride 106 98 - 111 mmol/L   CO2 24 22 - 32 mmol/L   Glucose, Bld 110 (H) 70 - 99 mg/dL    Comment: Glucose reference range applies only to samples taken after fasting for at least 8 hours.   BUN 16 8 - 23 mg/dL   Creatinine, Ser 4.09 0.44 - 1.00 mg/dL   Calcium 6.7 (L) 8.9 - 10.3 mg/dL   Total Protein 5.1 (L) 6.5 - 8.1 g/dL   Albumin 2.4 (L) 3.5 - 5.0 g/dL   AST 27 15 - 41 U/L   ALT 20 0 - 44 U/L   Alkaline Phosphatase 63 38 - 126 U/L   Total Bilirubin 1.1 0.0 - 1.2 mg/dL   GFR, Estimated >81 >19 mL/min    Comment: (NOTE) Calculated using the CKD-EPI Creatinine Equation (2021)    Anion gap 7 5 - 15    Comment: Performed at [facility], [address] [place], [place] [address]   CBC with Differential/Platelet     Status: Abnormal   Collection Time: 07/30/23  4:47 AM  Result Value Ref Range   WBC 1.0 (LL) 4.0 - 10.5 K/uL    Comment: REPEATED TO VERIFY THIS CRITICAL RESULT HAS VERIFIED AND BEEN CALLED TO MARK T BY SHANA DALTON ON 03 03 2025 AT 0629, AND HAS BEEN READ BACK.     RBC 3.17 (L) 3.87 - 5.11 MIL/uL   Hemoglobin 9.3 (L) 12.0 - 15.0 g/dL   HCT 95.6 (L) 21.3 - 08.6 %   MCV 90.9 80.0 - 100.0 fL   MCH 29.3 26.0 - 34.0 pg   MCHC 32.3 30.0 - 36.0 g/dL   RDW 57.8 (H) 46.9 - 62.9 %   Platelets 50 (L) 150 - 400 K/uL    Comment: SPECIMEN CHECKED FOR CLOTS Immature Platelet Fraction may be clinically indicated, consider ordering this additional test [id number]    nRBC 0.0 0.0 - 0.2 %   Neutrophils Relative % 39 %   Neutro Abs 0.4 (LL) 1.7 - 7.7 K/uL    Comment: REPEATED TO VERIFY THIS  CRITICAL RESULT HAS VERIFIED AND BEEN CALLED TO MARK T BY SHANA DALTON ON 03 03 2025 AT 0631, AND HAS BEEN READ BACK.     Band Neutrophils 2 %   Lymphocytes Relative 49 %   Lymphs Abs 0.5 (L) 0.7 - 4.0 K/uL   Monocytes Relative 5 %   Monocytes Absolute 0.1 0.1 - 1.0 K/uL   Eosinophils Relative 5 %   Eosinophils Absolute 0.1 0.0 - 0.5 K/uL   Basophils Relative 0 %   Basophils Absolute 0.0 0.0 - 0.1 K/uL   WBC Morphology TOXIC GRANULATION    Smear Review PLATELET COUNT CONFIRMED BY SMEAR    Abs Immature Granulocytes 0.00 0.00 - 0.07 K/uL   Reactive, Benign Lymphocytes PRESENT    Polychromasia PRESENT     Comment: Performed at [facility], [address] [place], [place] [address]  Basic metabolic panel     Status: Abnormal   Collection Time: 07/31/23  4:40 AM  Result Value Ref Range   Sodium 138 135 - 145 mmol/L   Potassium 3.5 3.5 - 5.1 mmol/L   Chloride 106 98 - 111 mmol/L   CO2 27 22 - 32 mmol/L   Glucose, Bld 96 70 - 99 mg/dL    Comment: Glucose reference range applies only to samples taken after fasting for at least 8 hours.   BUN 13 8 - 23 mg/dL    Creatinine, Ser 6.04 0.44 - 1.00 mg/dL   Calcium 6.5 (L) 8.9 - 10.3 mg/dL   GFR, Estimated >54 >09 mL/min    Comment: (NOTE) Calculated using the CKD-EPI Creatinine Equation (2021)    Anion gap 5 5 - 15    Comment: Performed at [facility], [address], [place], [place] [address]  Magnesium     Status: None   Collection Time: 07/31/23  4:40 AM  Result Value Ref Range   Magnesium 1.8 1.7 - 2.4 mg/dL    Comment: Performed at [facility], [address], [place], [place] [address]  CBC with Differential/Platelet     Status: Abnormal   Collection Time: 07/31/23  4:40 AM  Result Value Ref Range   WBC 1.0 (LL) 4.0 - 10.5 K/uL    Comment: REPEATED TO VERIFY THIS CRITICAL RESULT HAS VERIFIED AND BEEN CALLED TO MARK PIDBIRNY BY ANDREA SNYDER ON 03 04 2025 AT 0650, AND HAS BEEN READ BACK.     RBC 2.95 (L) 3.87 - 5.11 MIL/uL   Hemoglobin 8.7 (L) 12.0 - 15.0 g/dL   HCT 56.2 (L) 13.0 - 86.5 %   MCV 91.5 80.0 - 100.0 fL   MCH 29.5 26.0 - 34.0 pg   MCHC 32.2 30.0 - 36.0 g/dL   RDW 78.4 (H) 69.6 - 29.5 %   Platelets 34 (L) 150 - 400 K/uL    Comment: SPECIMEN CHECKED FOR CLOTS Immature Platelet Fraction may be clinically indicated, consider ordering this additional test [id number] REPEATED TO VERIFY    nRBC 0.0 0.0 - 0.2 %   Neutrophils Relative % 32 %   Neutro Abs 0.4 (LL) 1.7 - 7.7 K/uL    Comment: REPEATED TO VERIFY THIS CRITICAL RESULT HAS VERIFIED AND BEEN CALLED TO MARK PIDBIRNY BY ANDREA SNYDER ON 03 04 2025 AT 0651, AND HAS BEEN READ BACK.     Band Neutrophils 8 %   Lymphocytes Relative 47 %   Lymphs Abs 0.5 (L) 0.7 - 4.0 K/uL   Monocytes Relative 6 %   Monocytes Absolute 0.1 0.1 - 1.0 K/uL  Eosinophils Relative 4 %   Eosinophils Absolute 0.0 0.0 - 0.5 K/uL   Basophils Relative 3 %   Basophils Absolute 0.0 0.0 - 0.1 K/uL   WBC Morphology TOXIC GRANULATION    Smear Review PLATELETS APPEAR DECREASED     Comment: PLATELET COUNT CONFIRMED BY SMEAR   nRBC 1 (H) 0 /100 WBC    Abs Immature Granulocytes 0.00 0.00 - 0.07 K/uL   Reactive, Benign Lymphocytes PRESENT    Polychromasia PRESENT     Comment: Performed at [facility], [address], [place], [place] [address]   No results found.   MEDICATIONS: I have reviewed the patient's current medications.     Assessment/Plan:  Acute myeloid leukemia: -Admitted with pancytopenia and fevers on 07/23/2023 - CTAP: Normal spleen and no acute changes. - Bone marrow biopsy (07/27/2023): Acute myeloid leukemia with blast cells representing 36% of all cells.  Dysplasia in erythroid precursors. - PML/RARA t(15;17) negative.  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  Monosomy 17 may lead to loss of T p53 which is considered high risk. - We have sent cytogenetics and AML NGS panel which could take up to 17 days. - I had a prolonged discussion with the patient and her family members.  At this time we do not have a full information about her leukemia and the patient is too weak to tolerate any low intensity treatment. - I have recommended that she be placed in a rehab center and follow-up with me in 2 weeks to discuss final results.  If her leukemia has high risk features, I will recommend best supportive care in the form of hospice.  If her functional status improves and her leukemia does not have high risk features, we may discuss low intensity treatment at that time. - She may continue follow-up in our office once a week for lab check and possible transfusions. - Will discuss with Dr. Sherryll Burger.   All questions were answered. The patient knows to call the clinic with any problems, questions or concerns. We can certainly see the patient much sooner if necessary.     Doreatha Massed

## 2023-07-31 NOTE — Plan of Care (Signed)

## 2023-08-01 DIAGNOSIS — Z8042 Family history of malignant neoplasm of prostate: Secondary | ICD-10-CM | POA: Diagnosis not present

## 2023-08-01 DIAGNOSIS — Z79899 Other long term (current) drug therapy: Secondary | ICD-10-CM | POA: Diagnosis not present

## 2023-08-01 DIAGNOSIS — S36119D Unspecified injury of liver, subsequent encounter: Secondary | ICD-10-CM | POA: Diagnosis not present

## 2023-08-01 DIAGNOSIS — Y92009 Unspecified place in unspecified non-institutional (private) residence as the place of occurrence of the external cause: Secondary | ICD-10-CM | POA: Diagnosis not present

## 2023-08-01 DIAGNOSIS — K921 Melena: Secondary | ICD-10-CM | POA: Diagnosis not present

## 2023-08-01 DIAGNOSIS — N390 Urinary tract infection, site not specified: Secondary | ICD-10-CM | POA: Diagnosis not present

## 2023-08-01 DIAGNOSIS — S32000G Wedge compression fracture of unspecified lumbar vertebra, subsequent encounter for fracture with delayed healing: Secondary | ICD-10-CM | POA: Diagnosis not present

## 2023-08-01 DIAGNOSIS — C9290 Myeloid leukemia, unspecified, not having achieved remission: Secondary | ICD-10-CM | POA: Diagnosis not present

## 2023-08-01 DIAGNOSIS — R1319 Other dysphagia: Secondary | ICD-10-CM | POA: Diagnosis not present

## 2023-08-01 DIAGNOSIS — K219 Gastro-esophageal reflux disease without esophagitis: Secondary | ICD-10-CM | POA: Diagnosis not present

## 2023-08-01 DIAGNOSIS — W19XXXA Unspecified fall, initial encounter: Secondary | ICD-10-CM | POA: Diagnosis not present

## 2023-08-01 DIAGNOSIS — M6281 Muscle weakness (generalized): Secondary | ICD-10-CM | POA: Diagnosis not present

## 2023-08-01 DIAGNOSIS — Z23 Encounter for immunization: Secondary | ICD-10-CM | POA: Diagnosis present

## 2023-08-01 DIAGNOSIS — R488 Other symbolic dysfunctions: Secondary | ICD-10-CM | POA: Diagnosis not present

## 2023-08-01 DIAGNOSIS — D61818 Other pancytopenia: Secondary | ICD-10-CM | POA: Diagnosis not present

## 2023-08-01 DIAGNOSIS — A419 Sepsis, unspecified organism: Secondary | ICD-10-CM | POA: Diagnosis not present

## 2023-08-01 DIAGNOSIS — F411 Generalized anxiety disorder: Secondary | ICD-10-CM | POA: Diagnosis not present

## 2023-08-01 DIAGNOSIS — R498 Other voice and resonance disorders: Secondary | ICD-10-CM | POA: Diagnosis not present

## 2023-08-01 DIAGNOSIS — M8580 Other specified disorders of bone density and structure, unspecified site: Secondary | ICD-10-CM | POA: Diagnosis not present

## 2023-08-01 DIAGNOSIS — Z860101 Personal history of adenomatous and serrated colon polyps: Secondary | ICD-10-CM | POA: Diagnosis not present

## 2023-08-01 DIAGNOSIS — M810 Age-related osteoporosis without current pathological fracture: Secondary | ICD-10-CM | POA: Diagnosis not present

## 2023-08-01 DIAGNOSIS — C9200 Acute myeloblastic leukemia, not having achieved remission: Secondary | ICD-10-CM | POA: Diagnosis present

## 2023-08-01 DIAGNOSIS — K580 Irritable bowel syndrome with diarrhea: Secondary | ICD-10-CM | POA: Diagnosis not present

## 2023-08-01 DIAGNOSIS — K449 Diaphragmatic hernia without obstruction or gangrene: Secondary | ICD-10-CM | POA: Diagnosis not present

## 2023-08-01 DIAGNOSIS — R262 Difficulty in walking, not elsewhere classified: Secondary | ICD-10-CM | POA: Diagnosis not present

## 2023-08-01 DIAGNOSIS — Z87440 Personal history of urinary (tract) infections: Secondary | ICD-10-CM | POA: Diagnosis not present

## 2023-08-01 LAB — CBC WITH DIFFERENTIAL/PLATELET
Abs Immature Granulocytes: 0 10*3/uL (ref 0.00–0.07)
Band Neutrophils: 3 %
Basophils Absolute: 0 10*3/uL (ref 0.0–0.1)
Basophils Relative: 0 %
Eosinophils Absolute: 0 10*3/uL (ref 0.0–0.5)
Eosinophils Relative: 1 %
HCT: 26.2 % — ABNORMAL LOW (ref 36.0–46.0)
Hemoglobin: 8.6 g/dL — ABNORMAL LOW (ref 12.0–15.0)
Lymphocytes Relative: 59 %
Lymphs Abs: 0.8 10*3/uL (ref 0.7–4.0)
MCH: 30.3 pg (ref 26.0–34.0)
MCHC: 32.8 g/dL (ref 30.0–36.0)
MCV: 92.3 fL (ref 80.0–100.0)
Monocytes Absolute: 0 10*3/uL — ABNORMAL LOW (ref 0.1–1.0)
Monocytes Relative: 2 %
Neutro Abs: 0.5 10*3/uL — ABNORMAL LOW (ref 1.7–7.7)
Neutrophils Relative %: 35 %
Platelets: 33 10*3/uL — ABNORMAL LOW (ref 150–400)
RBC: 2.84 MIL/uL — ABNORMAL LOW (ref 3.87–5.11)
RDW: 17.2 % — ABNORMAL HIGH (ref 11.5–15.5)
Smear Review: DECREASED
WBC: 1.4 10*3/uL — CL (ref 4.0–10.5)
nRBC: 1.5 % — ABNORMAL HIGH (ref 0.0–0.2)

## 2023-08-01 LAB — BASIC METABOLIC PANEL
Anion gap: 6 (ref 5–15)
BUN: 13 mg/dL (ref 8–23)
CO2: 27 mmol/L (ref 22–32)
Calcium: 6.7 mg/dL — ABNORMAL LOW (ref 8.9–10.3)
Chloride: 101 mmol/L (ref 98–111)
Creatinine, Ser: 0.67 mg/dL (ref 0.44–1.00)
GFR, Estimated: >60 mL/min (ref >60)
Glucose, Bld: 102 mg/dL — ABNORMAL HIGH (ref 70–99)
Potassium: 4 mmol/L (ref 3.5–5.1)
Sodium: 134 mmol/L — ABNORMAL LOW (ref 135–145)

## 2023-08-01 LAB — MAGNESIUM: Magnesium: 1.8 mg/dL (ref 1.7–2.4)

## 2023-08-01 MED ORDER — ALPRAZOLAM 0.5 MG PO TABS
0.5000 mg | ORAL_TABLET | Freq: Every day | ORAL | 0 refills | Status: DC
Start: 2023-08-01 — End: 2023-08-01

## 2023-08-01 MED ORDER — METOPROLOL TARTRATE 50 MG PO TABS: 50.0000 mg | ORAL_TABLET | Freq: Two times a day (BID) | ORAL | 0 refills | Status: DC

## 2023-08-01 MED ORDER — ALPRAZOLAM 0.5 MG PO TABS: 0.5000 mg | ORAL_TABLET | Freq: Every day | ORAL | 0 refills | Status: DC

## 2023-08-01 MED ORDER — AMLODIPINE BESYLATE 5 MG PO TABS: 5.0000 mg | ORAL_TABLET | Freq: Every day | ORAL | 0 refills | Status: DC

## 2023-08-01 MED ORDER — DOCUSATE SODIUM 100 MG PO CAPS: 100.0000 mg | ORAL_CAPSULE | Freq: Every day | ORAL | 0 refills | Status: DC | PRN

## 2023-08-01 NOTE — TOC Transition Note (Signed)
 Transition of Care [facility]) - Discharge Note   Patient Details  Name: Stephanie Wood MRN: [id number] Date of Birth: 12/11/1934  Transition of Care [facility]) CM/SW Contact:  Leitha Bleak, RN Phone Number: 08/01/2023, 10:19 AM   Clinical Narrative:   Patient discharging to [facility], Auth received. CM spoke with her daughter, Lynden Ang to review discharge. Lynnea Ferrier will call Lynden Ang, RN to call report. Clinicals sent in the hub.    Final next level of care: Skilled Nursing Facility Barriers to Discharge: Barriers Resolved   Patient Goals and CMS Choice Patient states their goals for this hospitalization and ongoing recovery are:: agreeable to SNF CMS Medicare.gov Compare Post Acute Care list provided to:: Patient Represenative (must comment) Choice offered to / list presented to : Adult Children [place] ownership interest in [facility]provided to:: Patient   Discharge Placement               Patient to be transferred to facility by: Staff Name of family member notified: [facility] Additional resources added to the After Visit Summary for       Social Drivers of Health (SDOH) Interventions SDOH Screenings   Food Insecurity: No Food Insecurity (07/23/2023)  Housing: Low Risk  (07/23/2023)  Transportation Needs: No Transportation Needs (07/23/2023)  Utilities: Not At Risk (07/23/2023)  Alcohol Screen: Low Risk  (12/20/2022)  Depression (PHQ2-9): Low Risk  (07/02/2023)  Financial Resource Strain: Low Risk  (12/20/2022)  Physical Activity: Sufficiently Active (12/20/2022)  Social Connections: Socially Integrated (07/23/2023)  Stress: No Stress Concern Present (12/20/2022)  Tobacco Use: Low Risk  (07/23/2023)  Health Literacy: Adequate Health Literacy (12/20/2022)    Readmission Risk Interventions    08/01/2023   10:15 AM  Readmission Risk Prevention Plan  Post Dischage Appt Complete  Medication Screening Complete  Transportation Screening  Complete

## 2023-08-01 NOTE — Discharge Summary (Signed)
 Physician Discharge Summary  Stephanie Wood:[id number] DOB: 1935-02-09 DOA: 07/23/2023  PCP: Billie Lade, MD  Admit date: 07/23/2023  Discharge date: 08/01/2023  Admitted From:Home  Disposition:  SNF  Recommendations for Outpatient Follow-up:  Follow up with PCP in 1-2 weeks Follow-up with Dr. Ellin Saba outpatient in 2 weeks to discuss further treatment plans regarding AML diagnosis Continue medications as otherwise noted below  Home Health: None  Equipment/Devices: Nasal Cannula oxygen  Discharge Condition:Stable  CODE STATUS: DNR  Diet recommendation: Heart Healthy  Brief/Interim Summary: Stephanie Wood is a 89 y.o. 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 Patient lives at home and able to take care of her ADLs. 2/11, seen by GI as an outpatient with concern of black stools, poor appetite. Elective EGD was planned for March. Patient continued to have black stool leading to progressive weakness.   2/24, patient felt dizzy, unsteady and had a fall and hence brought to the ED by EMS from home. Patient has had bone marrow biopsy performed for evaluation of pancytopenia that may be positive for AML.  Dr. Ellin Saba planning to have further discussions regarding treatment plans in the next 2 weeks in the outpatient setting.  In the meantime, patient to go to SNF for rehabilitation.  Discharge Diagnoses:  Principal Problem:   Sepsis (HCC) Active Problems:   GI bleed   Osteoporosis   Gastroesophageal reflux disease   Anxiety state   Pancytopenia (HCC)   Fecal occult blood test positive   Recurrent UTI   Fever, unknown origin   Hyperglycemia   Acute myeloid leukemia not having achieved remission (HCC)  Principal discharge diagnosis: Transient hypotension with new onset pancytopenia status post bone marrow biopsy with concern for AML.  Sepsis ruled  out.  Discharge Instructions  Discharge Instructions     Diet - low sodium heart healthy   Complete by: As directed    Increase activity slowly   Complete by: As directed       Allergies as of 08/01/2023       Reactions   Drug Class [trazodone And Nefazodone]    Macrobid [nitrofurantoin] Other (See Comments)   Elevated LFT's        Medication List     STOP taking these medications    furosemide 20 MG tablet Commonly known as: LASIX       TAKE these medications    ALPRAZolam 0.5 MG tablet Commonly known as: XANAX Take 1 tablet (0.5 mg total) by mouth at bedtime.   amLODipine 5 MG tablet Commonly known as: NORVASC Take 1 tablet (5 mg total) by mouth daily. Start taking on: August 02, 2023   atorvastatin 20 MG tablet Commonly known as: LIPITOR Take 20 mg by mouth daily.   bifidobacterium infantis capsule Take 1 capsule by mouth daily.   CALCIUM 600 PO Take 600 mg by mouth daily.   cyanocobalamin 1000 MCG tablet Commonly known as: VITAMIN B12 Take 1,000 mcg by mouth daily.   dicyclomine 10 MG capsule Commonly known as: BENTYL Take 1 capsule (10 mg total) by mouth every 12 (twelve) hours as needed for spasms (abdominal pain).   docusate sodium 100 MG capsule Commonly known as: COLACE Take 1 capsule (100 mg total) by mouth daily as needed for mild constipation.   ELDERBERRY PO Take 1 capsule by mouth daily.   fluticasone 0.05 % cream Commonly known as: CUTIVATE Apply 1 Application topically  2 (two) times daily as needed (skin irritiation/rash).   ketoconazole 2 % cream Commonly known as: NIZORAL Apply 1 Application topically 2 (two) times daily as needed for irritation.   loperamide 2 MG capsule Commonly known as: IMODIUM Take 2 mg by mouth as needed for diarrhea or loose stools.   loratadine 10 MG tablet Commonly known as: CLARITIN Take 1 tablet (10 mg total) by mouth daily.   methenamine 1 g tablet Commonly known as: HIPREX TAKE 1 TABLET  BY MOUTH TWICE DAILY.   metoprolol tartrate 50 MG tablet Commonly known as: LOPRESSOR Take 1 tablet (50 mg total) by mouth 2 (two) times daily.   omeprazole 40 MG capsule Commonly known as: PRILOSEC TAKE 1 CAPSULE BY MOUTH DAILY   Prolia 60 MG/ML Sosy injection Generic drug: denosumab Inject 60 mg into the skin every 6 (six) months.        Follow-up Information     Billie Lade, MD. Schedule an appointment as soon as possible for a visit in 1 month(s).   Specialty: Internal Medicine Contact information: [address] [place] [address] [phone]         Doreatha Massed, MD. Go to.   Specialty: Hematology Contact information: [address] [place] [address] [phone]                Allergies  Allergen Reactions   Drug Class [Trazodone And Nefazodone]    Macrobid [Nitrofurantoin] Other (See Comments)    Elevated LFT's    Consultations: Dr. Ellin Saba   Procedures/Studies: IR BONE MARROW BIOPSY Result Date: 07/27/2023 INDICATION: 89 year old with pancytopenia.  Request for bone marrow biopsy. EXAM: FLUOROSCOPIC GUIDED BONE MARROW ASPIRATES AND BIOPSY Physician: Rachelle Hora. Henn, MD MEDICATIONS: Moderate sedation ANESTHESIA/SEDATION: Moderate (conscious) sedation was employed during this procedure. A total of Versed 0.5mg  and fentanyl 25 mcg was administered intravenously at the order of the provider performing the procedure. Total intra-service moderate sedation time: 14 minutes. Patient's level of consciousness and vital signs were monitored continuously by radiology nurse throughout the procedure under the supervision of the provider performing the procedure. COMPLICATIONS: None immediate. FLUOROSCOPY: Radiation Exposure Index (as provided by the fluoroscopic device): 13.3 mGy Kerma PROCEDURE: The procedure was explained to the patient. The risks and benefits of the procedure were discussed and the patient's questions were addressed.  Informed consent was obtained from the patient. The patient was placed prone on interventional table. The back was prepped and draped in sterile fashion. Maximal barrier sterile technique was utilized including caps, mask, sterile gowns, sterile gloves, sterile drape, hand hygiene and skin antiseptic. The skin and right posterior ilium were anesthetized with 1% lidocaine. OnControl bone needle was directed into the right ilium with fluoroscopic guidance. Two aspirates were obtained and one core biopsy was obtained with the powered drill. Two additional cores were performed with the 11 gauge hand bone drill using fluoroscopic guidance. Bandage placed over the puncture site. Fluoroscopic image saved for documentation. FINDINGS: Biopsy needles was directed into the posterior right ilium. Small core biopsy specimens were obtained. IMPRESSION: Fluoroscopic guided bone marrow aspiration and core biopsy. Electronically Signed   By: Richarda Overlie M.D.   On: 07/27/2023 13:52   [place] EKG SITE RITE Result Date: 07/25/2023 If [facility] image not attached, placement could not be confirmed due to current cardiac rhythm.  Portable chest 1 View Result Date: 07/24/2023 CLINICAL DATA:  Fever. EXAM: PORTABLE CHEST 1 VIEW COMPARISON:  Radiographs 07/22/2022 and 08/23/2013. Abdominal  CT 07/23/2023 FINDINGS: 0650 hours. Persistent low lung volumes. The heart size and mediastinal contours are stable with a moderate size hiatal hernia. No significant change in vascular congestion and bibasilar atelectasis from yesterday. No confluent airspace disease, pneumothorax or acute osseous abnormality identified. IMPRESSION: No significant change in vascular congestion and bibasilar atelectasis from yesterday. No evidence of pneumonia. Electronically Signed   By: Carey Bullocks M.D.   On: 07/24/2023 10:54   DG Chest Portable 1 View Result Date: 07/23/2023 CLINICAL DATA:  Cough.  Dizziness with fall. EXAM: PORTABLE CHEST 1 VIEW COMPARISON:   Radiographs 08/23/2013 and 07/28/2013. Abdominal CT 07/23/2023. FINDINGS: 1153 hours. Low lung volumes. The heart size and mediastinal contours are stable with a moderate size hiatal hernia. There is vascular congestion with mildly increased atelectasis at both lung bases. No confluent airspace disease, pleural effusion or pneumothorax. No acute fractures are demonstrated. IMPRESSION: Low lung volumes with vascular congestion and mildly increased atelectasis at both lung bases. No evidence of pneumonia or pulmonary edema. Electronically Signed   By: Carey Bullocks M.D.   On: 07/23/2023 13:44   CT ABDOMEN PELVIS W CONTRAST Result Date: 07/23/2023 CLINICAL DATA:  89 year old female status post fall. Dizziness. Abdominal pain. EXAM: CT ABDOMEN AND PELVIS WITH CONTRAST TECHNIQUE: Multidetector CT imaging of the abdomen and pelvis was performed using the standard protocol following bolus administration of intravenous contrast. RADIATION DOSE REDUCTION: This exam was performed according to the departmental dose-optimization program which includes automated exposure control, adjustment of the mA and/or kV according to patient size and/or use of iterative reconstruction technique. CONTRAST:  OMNIPAQUE IOHEXOL 300 MG/ML  SOLN COMPARISON:  CT Abdomen and Pelvis 01/10/2022. FINDINGS: Lower chest: Moderate to large gastric hiatal hernia does not appear significantly changed since 2023. Superimposed mild cardiomegaly is stable. No pericardial effusion. Chronic increased AP dimension to the lungs. Trace new pleural effusions. Patchy, streaky new dependent lung opacity. Hepatobiliary: Negative liver and gallbladder. Pancreas: Partially atrophied. Spleen: Negative. Adrenals/Urinary Tract: Stable since 2023 and negative. Symmetric renal enhancement and early contrast excretion. Benign left renal cysts (no follow-up imaging recommended). Stomach/Bowel: Fluid in the right colon and transverse colon, but decompressed distal  transverse through rectum colonic segments. No large bowel wall thickening identified. Appendix remains normal on coronal image 68. Decompressed terminal ileum and no dilated small bowel. Chronic partially intrathoracic stomach. Second portion duodenum chronic diverticulum with no active inflammation. No free air, free fluid, or mesenteric inflammation identified. Vascular/Lymphatic: Mild for age Aortoiliac calcified atherosclerosis. Normal caliber abdominal aorta. Major arterial structures and portal venous system appear to be patent. No lymphadenopathy. Reproductive: Negative. Other: No pelvis free fluid. Small fat containing right inguinal hernia is chronic and stable. Musculoskeletal: No displaced lower rib fracture is identified. Chronic L1 compression fracture is moderate to severe but stable since 2023. Stable visible vertebral height otherwise. Chronic osteopenia. No acute osseous abnormality identified. IMPRESSION: 1. Trace new pleural effusions and patchy dependent lung opacity which more resembles atelectasis than infection. Superimposed chronic moderate to large gastric hiatal hernia. 2. No acute traumatic injury or acute/inflammatory process identified in the abdomen or pelvis. Normal appendix. Chronic L1 compression fracture. 3.  Aortic Atherosclerosis (ICD10-I70.0). Electronically Signed   By: Odessa Fleming M.D.   On: 07/23/2023 10:30   CT Head Wo Contrast Result Date: 07/23/2023 CLINICAL DATA:  Provided history: Syncope/presyncope, cerebrovascular cause suspected. EXAM: CT HEAD WITHOUT CONTRAST TECHNIQUE: Contiguous axial images were obtained from the base of the skull through the vertex without intravenous contrast. RADIATION  DOSE REDUCTION: This exam was performed according to the departmental dose-optimization program which includes automated exposure control, adjustment of the mA and/or kV according to patient size and/or use of iterative reconstruction technique. COMPARISON:  None. FINDINGS: Brain:  Generalized cerebral and cerebellar atrophy. Patchy and ill-defined hypoattenuation within the cerebral white matter, nonspecific but compatible with mild chronic small vessel ischemic disease. There is no acute intracranial hemorrhage. No demarcated cortical infarct. No extra-axial fluid collection. No evidence of an intracranial mass. No midline shift. Vascular: No hyperdense vessel. Atherosclerotic calcifications. Skull: No calvarial fracture or aggressive osseous lesion. Sinuses/Orbits: No mass or acute finding within the imaged orbits. Minimal mucosal thickening within the bilateral ethmoid and right sphenoid sinuses. IMPRESSION: 1. No evidence of an acute intracranial abnormality. 2. Mild chronic small vessel ischemic changes within the cerebral white matter. 3. Generalized parenchymal atrophy. Electronically Signed   By: Jackey Loge D.O.   On: 07/23/2023 10:13     Discharge Exam: Vitals:   07/31/23 1910 08/01/23 0407  BP:  114/63  Pulse: (!) 103 84  Resp:    Temp: 98.6 F (37 C) 98 F (36.7 C)  SpO2: 96% 96%   Vitals:   07/30/23 1916 07/31/23 0410 07/31/23 1910 08/01/23 0407  BP:    114/63  Pulse:   (!) 103 84  Resp:      Temp: 98 F (36.7 C) 98.4 F (36.9 C) 98.6 F (37 C) 98 F (36.7 C)  TempSrc: Oral Axillary Axillary Axillary  SpO2:   96% 96%  Weight:      Height:        General: Pt is alert, awake, not in acute distress Cardiovascular: RRR, S1/S2 +, no rubs, no gallops Respiratory: CTA bilaterally, no wheezing, no rhonchi, [place] Abdominal: Soft, NT, ND, bowel sounds + Extremities: no edema, no cyanosis    The results of significant diagnostics from this hospitalization (including imaging, microbiology, ancillary and laboratory) are listed below for reference.     Microbiology: Recent Results (from the past 240 hours)  Resp panel by RT-PCR (RSV, Flu A&B, Covid) Anterior Nasal Swab     Status: None   Collection Time: 07/23/23 12:02 PM   Specimen: Anterior Nasal  Swab  Result Value Ref Range Status   SARS Coronavirus 2 by RT PCR NEGATIVE NEGATIVE Final    Comment: (NOTE) SARS-CoV-2 target nucleic acids are NOT DETECTED.  The SARS-CoV-2 RNA is generally detectable in upper respiratory specimens during the acute phase of infection. The lowest concentration of SARS-CoV-2 viral copies this assay can detect is 138 copies/mL. A negative result does not preclude SARS-Cov-2 infection and should not be used as the sole basis for treatment or other patient management decisions. A negative result may occur with  improper specimen collection/handling, submission of specimen other than nasopharyngeal swab, presence of viral mutation(s) within the areas targeted by this assay, and inadequate number of viral copies(<138 copies/mL). A negative result must be combined with clinical observations, patient history, and epidemiological information. The expected result is Negative.  Fact Sheet for Patients:  BloggerCourse.com  Fact Sheet for Healthcare Providers:  SeriousBroker.it  This test is no t yet approved or cleared by the [place] FDA and  has been authorized for detection and/or diagnosis of SARS-CoV-2 by FDA under an Emergency Use Authorization (EUA). This EUA will remain  in effect (meaning this test can be used) for the duration of the COVID-19 declaration under Section 564(b)(1) of the Act, 21 U.S.C.section 360bbb-3(b)(1), unless the authorization is  terminated  or revoked sooner.       Influenza A by PCR NEGATIVE NEGATIVE Final   Influenza B by PCR NEGATIVE NEGATIVE Final    Comment: (NOTE) The Xpert Xpress SARS-CoV-2/FLU/RSV plus assay is intended as an aid in the diagnosis of influenza from Nasopharyngeal swab specimens and should not be used as a sole basis for treatment. Nasal washings and aspirates are unacceptable for Xpert Xpress SARS-CoV-2/FLU/RSV testing.  Fact Sheet for  Patients: BloggerCourse.com  Fact Sheet for Healthcare Providers: SeriousBroker.it  This test is not yet approved or cleared by the [place] FDA and has been authorized for detection and/or diagnosis of SARS-CoV-2 by FDA under an Emergency Use Authorization (EUA). This EUA will remain in effect (meaning this test can be used) for the duration of the COVID-19 declaration under Section 564(b)(1) of the Act, 21 U.S.C. section 360bbb-3(b)(1), unless the authorization is terminated or revoked.     Resp Syncytial Virus by PCR NEGATIVE NEGATIVE Final    Comment: (NOTE) Fact Sheet for Patients: BloggerCourse.com  Fact Sheet for Healthcare Providers: SeriousBroker.it  This test is not yet approved or cleared by the [place] FDA and has been authorized for detection and/or diagnosis of SARS-CoV-2 by FDA under an Emergency Use Authorization (EUA). This EUA will remain in effect (meaning this test can be used) for the duration of the COVID-19 declaration under Section 564(b)(1) of the Act, 21 U.S.C. section 360bbb-3(b)(1), unless the authorization is terminated or revoked.  Performed at [facility], [address], [place], [place] [address]   Culture, blood (Routine X 2) w Reflex to ID Panel     Status: None   Collection Time: 07/24/23  9:27 AM   Specimen: BLOOD  Result Value Ref Range Status   Specimen Description BLOOD BLOOD LEFT HAND  Final   Special Requests   Final    BOTTLES DRAWN AEROBIC AND ANAEROBIC Blood Culture adequate volume   Culture   Final    NO GROWTH 5 DAYS Performed at [facility], [address] [place], [place] [address]    Report Status 07/29/2023 FINAL  Final  Culture, blood (Routine X 2) w Reflex to ID Panel     Status: None   Collection Time: 07/24/23  9:27 AM   Specimen: BLOOD  Result Value Ref Range Status   Specimen Description BLOOD BLOOD  RIGHT HAND  Final   Special Requests   Final    BOTTLES DRAWN AEROBIC AND ANAEROBIC Blood Culture adequate volume   Culture   Final    NO GROWTH 5 DAYS Performed at [facility], [address], [place], [place] [address]    Report Status 07/29/2023 FINAL  Final  MRSA Next Gen by PCR, Nasal     Status: None   Collection Time: 07/24/23 11:45 AM   Specimen: Nasal Mucosa; Nasal Swab  Result Value Ref Range Status   MRSA by PCR Next Gen NOT DETECTED NOT DETECTED Final    Comment: (NOTE) The GeneXpert MRSA Assay (FDA approved for NASAL specimens only), is one component of a comprehensive MRSA colonization surveillance program. It is not intended to diagnose MRSA infection nor to guide or monitor treatment for MRSA infections. Test performance is not FDA approved in patients less than 40 years old. Performed at [facility], [address], [place], [place] [address]   Culture, blood (Routine X 2) w Reflex to ID Panel     Status: None   Collection Time: 07/25/23 12:31 PM   Specimen:  BLOOD  Result Value Ref Range Status   Specimen Description BLOOD RIGHT ANTECUBITAL  Final   Special Requests   Final    BOTTLES DRAWN AEROBIC AND ANAEROBIC Blood Culture adequate volume   Culture   Final    NO GROWTH 5 DAYS Performed at [facility], [address], [place], [place] [address]    Report Status 07/30/2023 FINAL  Final  Culture, blood (Routine X 2) w Reflex to ID Panel     Status: None   Collection Time: 07/25/23 12:37 PM   Specimen: BLOOD  Result Value Ref Range Status   Specimen Description BLOOD LEFT ANTECUBITAL  Final   Special Requests   Final    BOTTLES DRAWN AEROBIC AND ANAEROBIC Blood Culture adequate volume   Culture   Final    NO GROWTH 5 DAYS Performed at [facility], [address], [place], [place] [address]    Report Status 07/30/2023 FINAL  Final     Labs: BNP (last 3 results) No results for input(s): "BNP" in the last 8760 hours. Basic Metabolic Panel: Recent Labs   Lab 07/25/23 1231 07/26/23 0338 07/27/23 0251 07/27/23 0800 07/28/23 0236 07/28/23 1111 07/29/23 0530 07/30/23 0447 07/31/23 0440 08/01/23 0452  NA  --  132*   < >  --  136  --  138 137 138 134*  K  --  4.1   < >  --  3.8  --  3.3* 3.3* 3.5 4.0  CL  --  106   < >  --  110  --  113* 106 106 101  CO2  --  17*   < >  --  20*  --  22 24 27 27   GLUCOSE  --  122*   < >  --  145*  --  113* 110* 96 102*  BUN  --  17   < >  --  25*  --  20 16 13 13   CREATININE  --  0.95   < >  --  0.79  --  0.63 0.59 0.61 0.67  CALCIUM  --  6.6*   < >  --  6.8*  --  6.8* 6.7* 6.5* 6.7*  MG 1.4* 1.5*  --  2.1  --  2.2  --   --  1.8 1.8  PHOS 1.2* 1.6*  --   --   --   --   --   --   --   --    < > = values in this interval not displayed.   Liver Function Tests: Recent Labs  Lab 07/27/23 0800 07/28/23 0236 07/29/23 0530 07/30/23 0447  AST  --  32 35 27  ALT  --  20 20 20   ALKPHOS  --  67 57 63  BILITOT  --  1.2 1.2 1.1  PROT  --  5.3* 5.3* 5.1*  ALBUMIN 2.1* 2.1* 2.5* 2.4*   No results for input(s): "LIPASE", "AMYLASE" in the last 168 hours. No results for input(s): "AMMONIA" in the last 168 hours. CBC: Recent Labs  Lab 07/28/23 0236 07/28/23 1111 07/29/23 0530 07/30/23 0447 07/31/23 0440 08/01/23 0452  WBC 1.0*  --  0.9* 1.0* 1.0* 1.4*  NEUTROABS 0.6*  --  0.5* 0.4* 0.4* 0.5*  HGB 9.5* 10.0* 9.0* 9.3* 8.7* 8.6*  HCT 28.3* 31.0* 27.2* 28.8* 27.0* 26.2*  MCV 89.6  --  90.1 90.9 91.5 92.3  PLT 111*  --  79* 50* 34* 33*   Cardiac Enzymes:  No results for input(s): "CKTOTAL", "CKMB", "CKMBINDEX", "TROPONINI" in the last 168 hours. BNP: Invalid input(s): "POCBNP" CBG: No results for input(s): "GLUCAP" in the last 168 hours. D-Dimer No results for input(s): "DDIMER" in the last 72 hours. Hgb A1c No results for input(s): "HGBA1C" in the last 72 hours. Lipid Profile No results for input(s): "CHOL", "HDL", "LDLCALC", "TRIG", "CHOLHDL", "LDLDIRECT" in the last 72 hours. Thyroid function  studies No results for input(s): "TSH", "T4TOTAL", "T3FREE", "THYROIDAB" in the last 72 hours.  Invalid input(s): "FREET3" Anemia work up No results for input(s): "VITAMINB12", "FOLATE", "FERRITIN", "TIBC", "IRON", "RETICCTPCT" in the last 72 hours. Urinalysis    Component Value Date/Time   COLORURINE YELLOW 07/25/2023 1250   APPEARANCEUR HAZY (A) 07/25/2023 1250   APPEARANCEUR Cloudy (A) 07/04/2023 1349   LABSPEC 1.018 07/25/2023 1250   PHURINE 5.0 07/25/2023 1250   GLUCOSEU NEGATIVE 07/25/2023 1250   HGBUR MODERATE (A) 07/25/2023 1250   BILIRUBINUR NEGATIVE 07/25/2023 1250   BILIRUBINUR Negative 07/04/2023 1349   KETONESUR NEGATIVE 07/25/2023 1250   PROTEINUR 30 (A) 07/25/2023 1250   UROBILINOGEN 0.2 08/23/2013 1106   NITRITE NEGATIVE 07/25/2023 1250   LEUKOCYTESUR MODERATE (A) 07/25/2023 1250   Sepsis Labs Recent Labs  Lab 07/29/23 0530 07/30/23 0447 07/31/23 0440 08/01/23 0452  WBC 0.9* 1.0* 1.0* 1.4*   Microbiology Recent Results (from the past 240 hours)  Resp panel by RT-PCR (RSV, Flu A&B, Covid) Anterior Nasal Swab     Status: None   Collection Time: 07/23/23 12:02 PM   Specimen: Anterior Nasal Swab  Result Value Ref Range Status   SARS Coronavirus 2 by RT PCR NEGATIVE NEGATIVE Final    Comment: (NOTE) SARS-CoV-2 target nucleic acids are NOT DETECTED.  The SARS-CoV-2 RNA is generally detectable in upper respiratory specimens during the acute phase of infection. The lowest concentration of SARS-CoV-2 viral copies this assay can detect is 138 copies/mL. A negative result does not preclude SARS-Cov-2 infection and should not be used as the sole basis for treatment or other patient management decisions. A negative result may occur with  improper specimen collection/handling, submission of specimen other than nasopharyngeal swab, presence of viral mutation(s) within the areas targeted by this assay, and inadequate number of viral copies(<138 copies/mL). A  negative result must be combined with clinical observations, patient history, and epidemiological information. The expected result is Negative.  Fact Sheet for Patients:  BloggerCourse.com  Fact Sheet for Healthcare Providers:  SeriousBroker.it  This test is no t yet approved or cleared by the [place] FDA and  has been authorized for detection and/or diagnosis of SARS-CoV-2 by FDA under an Emergency Use Authorization (EUA). This EUA will remain  in effect (meaning this test can be used) for the duration of the COVID-19 declaration under Section 564(b)(1) of the Act, 21 U.S.C.section 360bbb-3(b)(1), unless the authorization is terminated  or revoked sooner.       Influenza A by PCR NEGATIVE NEGATIVE Final   Influenza B by PCR NEGATIVE NEGATIVE Final    Comment: (NOTE) The Xpert Xpress SARS-CoV-2/FLU/RSV plus assay is intended as an aid in the diagnosis of influenza from Nasopharyngeal swab specimens and should not be used as a sole basis for treatment. Nasal washings and aspirates are unacceptable for Xpert Xpress SARS-CoV-2/FLU/RSV testing.  Fact Sheet for Patients: BloggerCourse.com  Fact Sheet for Healthcare Providers: SeriousBroker.it  This test is not yet approved or cleared by the [place] FDA and has been authorized for detection and/or diagnosis of SARS-CoV-2 by FDA  under an Emergency Use Authorization (EUA). This EUA will remain in effect (meaning this test can be used) for the duration of the COVID-19 declaration under Section 564(b)(1) of the Act, 21 U.S.C. section 360bbb-3(b)(1), unless the authorization is terminated or revoked.     Resp Syncytial Virus by PCR NEGATIVE NEGATIVE Final    Comment: (NOTE) Fact Sheet for Patients: BloggerCourse.com  Fact Sheet for Healthcare  Providers: SeriousBroker.it  This test is not yet approved or cleared by the [place] FDA and has been authorized for detection and/or diagnosis of SARS-CoV-2 by FDA under an Emergency Use Authorization (EUA). This EUA will remain in effect (meaning this test can be used) for the duration of the COVID-19 declaration under Section 564(b)(1) of the Act, 21 U.S.C. section 360bbb-3(b)(1), unless the authorization is terminated or revoked.  Performed at [facility], [address] [place], [place] [address]   Culture, blood (Routine X 2) w Reflex to ID Panel     Status: None   Collection Time: 07/24/23  9:27 AM   Specimen: BLOOD  Result Value Ref Range Status   Specimen Description BLOOD BLOOD LEFT HAND  Final   Special Requests   Final    BOTTLES DRAWN AEROBIC AND ANAEROBIC Blood Culture adequate volume   Culture   Final    NO GROWTH 5 DAYS Performed at [facility], [address], [place], [place] [address]    Report Status 07/29/2023 FINAL  Final  Culture, blood (Routine X 2) w Reflex to ID Panel     Status: None   Collection Time: 07/24/23  9:27 AM   Specimen: BLOOD  Result Value Ref Range Status   Specimen Description BLOOD BLOOD RIGHT HAND  Final   Special Requests   Final    BOTTLES DRAWN AEROBIC AND ANAEROBIC Blood Culture adequate volume   Culture   Final    NO GROWTH 5 DAYS Performed at [facility], [address] [place], [place] [address]    Report Status 07/29/2023 FINAL  Final  MRSA Next Gen by PCR, Nasal     Status: None   Collection Time: 07/24/23 11:45 AM   Specimen: Nasal Mucosa; Nasal Swab  Result Value Ref Range Status   MRSA by PCR Next Gen NOT DETECTED NOT DETECTED Final    Comment: (NOTE) The GeneXpert MRSA Assay (FDA approved for NASAL specimens only), is one component of a comprehensive MRSA colonization surveillance program. It is not intended to diagnose MRSA infection nor to guide or monitor treatment for MRSA  infections. Test performance is not FDA approved in patients less than 95 years old. Performed at [facility], [address] [place], [place] [address]   Culture, blood (Routine X 2) w Reflex to ID Panel     Status: None   Collection Time: 07/25/23 12:31 PM   Specimen: BLOOD  Result Value Ref Range Status   Specimen Description BLOOD RIGHT ANTECUBITAL  Final   Special Requests   Final    BOTTLES DRAWN AEROBIC AND ANAEROBIC Blood Culture adequate volume   Culture   Final    NO GROWTH 5 DAYS Performed at [facility], [address], [place], [place] [address]    Report Status 07/30/2023 FINAL  Final  Culture, blood (Routine X 2) w Reflex to ID Panel     Status: None   Collection Time: 07/25/23 12:37 PM   Specimen: BLOOD  Result Value Ref Range Status   Specimen Description BLOOD LEFT ANTECUBITAL  Final  Special Requests   Final    BOTTLES DRAWN AEROBIC AND ANAEROBIC Blood Culture adequate volume   Culture   Final    NO GROWTH 5 DAYS Performed at [facility], [address] [place], [place] [address]    Report Status 07/30/2023 FINAL  Final     Time coordinating discharge: 35 minutes  SIGNED:   Erick Blinks, DO [facility] 08/01/2023, 9:58 AM  If 7PM-7AM, please contact night-coverage [email or web address]

## 2023-08-02 ENCOUNTER — Encounter: Payer: Self-pay | Admitting: Internal Medicine

## 2023-08-02 ENCOUNTER — Encounter (HOSPITAL_COMMUNITY): Payer: Self-pay | Admitting: Hematology

## 2023-08-02 ENCOUNTER — Non-Acute Institutional Stay (SKILLED_NURSING_FACILITY): Payer: Self-pay | Admitting: Internal Medicine

## 2023-08-02 DIAGNOSIS — C9200 Acute myeloblastic leukemia, not having achieved remission: Secondary | ICD-10-CM | POA: Diagnosis not present

## 2023-08-02 DIAGNOSIS — D61818 Other pancytopenia: Secondary | ICD-10-CM | POA: Diagnosis not present

## 2023-08-02 DIAGNOSIS — S32000A Wedge compression fracture of unspecified lumbar vertebra, initial encounter for closed fracture: Secondary | ICD-10-CM | POA: Insufficient documentation

## 2023-08-02 DIAGNOSIS — A419 Sepsis, unspecified organism: Secondary | ICD-10-CM | POA: Diagnosis not present

## 2023-08-02 DIAGNOSIS — S32000G Wedge compression fracture of unspecified lumbar vertebra, subsequent encounter for fracture with delayed healing: Secondary | ICD-10-CM | POA: Diagnosis not present

## 2023-08-02 NOTE — Assessment & Plan Note (Addendum)
 08/01/2023 final white count 1400; H/H 8.6/26.2; and platelet count of 33,000. Oncology follow-up with Dr. Ellin Saba will be expedited if pancytopenia progressive.

## 2023-08-02 NOTE — Patient Instructions (Addendum)
 See assessment and plan under each diagnosis in the problem list and acutely for this visit Diagnoses & labs were reviewed with her daughter and discussed in detail.  CBC and differential will be performed in the morning to assess need for immediate hematologic intervention.

## 2023-08-02 NOTE — Assessment & Plan Note (Addendum)
 Appt with Dr. Ellin Saba to follow-up final BM results. Currently appt is 3/18.

## 2023-08-02 NOTE — Progress Notes (Signed)
 NURSING HOME LOCATION:  [facility] ROOM NUMBER:  102 P  CODE STATUS:  DNR  PCP:  Christel Mormon MD  This is a comprehensive admission note to this SNFperformed on this date less than 30 days from date of admission. Included are preadmission medical/surgical history; reconciled medication list; family history; social history and comprehensive review of systems.  Corrections and additions to the records were documented. Comprehensive physical exam was also performed. Additionally a clinical summary was entered for each active diagnosis pertinent to this admission in the Problem List to enhance continuity of care.  HPI: She was hospitalized 2/24 - 08/01/2023 presenting to ED with AMS 07/23/2023 after a fall.  This was in the context of melenous stools for which she was seen 2/11 by GI as an outpatient.  Elective EGD was to be scheduled this month.  CT of the abdomen/pelvis revealed a moderate-large gastric hiatal hernia and normal spleen. White count was profoundly suppressed with values ranging from 0.9 up to a high of 2.1.  H/H ranged from a level of 7/21.8  up to a high of 10.0/31.0.  Platelet count ranged from a high of 136,000 down to a low of 29,000.  Final platelet count was 33,000.  Apparently on 2/26 she received 2 units of packed red cells. 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   Discharge summary listed sepsis as principle diagnosis. Positive SIRS criteria included elevated temp & low WBC.Progress note 2/27 described lethargy and documented the patient had a temp max of 103.1 the prior day but this had resolved.  In the context of the lethargy, CT scan revealed mild chronic small vessel ischemic changes with generalized parenchymal atrophy. Multiple blood cultures were negative and lactic acid level was normal. Hypotension was present with a blood pressure of 86/52.  Broad-spectrum antibiotics were continued as sepsis criteria were  met.  Midodrine was added for blood pressure stabilization. Although initial calcium was 9.0 significant hypocalcemia was present with a nadir value of 6.5 and final value of 6.7.  Protein/caloric malnutrition was documented with a nadir total protein of 5.1 and albumin of 2.1. Pending Heme/Oncologic follow-up she was admitted to the SNF for rehab.  Past medical and surgical history: Includes GERD, GAD, osteoporosis, essential hypertension, dyslipidemia, colon adenoma,IBS,and history of drug-induced (Nitrofurantoin) transaminitis.Surgeries & procedures include colonoscopy & EGD.  Family history: reviewed, non contributory due to advanced age.  Social history: Nondrinker non-smoker.   Review of systems: Clinical neurocognitive deficits made validity of responses questionable , compromising ROS completion.  When asked the reason for hospitalization her response was "infection, its bad in your body."  She never mention the AML diagnosis and denied that she had any follow-up scheduled. Her major complaint is pain in her back which has been present for 4-5 years in the context of a fracture of the lower lumbar vertebrae.  She states it has never been treated.  She denies any bleeding dyscrasias until I asked about melena which she did validate as occurring PTA.  Physical exam:  Pertinent or positive findings: She appears chronically ill.  Hair is thin and disheveled.  She has bilateral ptosis.  Her voice is very weak, responses are markedly slow.  She is wearing nasal oxygen.  Heart sounds are distant; tachycardia is present.  She has basilar rales which are fine.  There is accentuation of the kyphotic curvature of the thoracic spine.  There are 2 padded dressings over the mid thoracic and  the lumbar sacral spine areas.  Pedal pulses are not palpable.  She has 1/2+ edema at the sock line.  Ecchymosis is present at the left antecubital area as well as the dorsum of the left hand. Strength to opposition is   fair & symmetrical.  General appearance: no acute distress, increased work of breathing is present.   Lymphatic: No lymphadenopathy about the head, neck, axilla. Eyes: No conjunctival inflammation or lid edema is present. There is no scleral icterus. Ears:  External ear exam shows no significant lesions or deformities.   Nose:  External nasal examination shows no deformity or inflammation. Nasal mucosa are pink and moist without lesions, exudates Oral exam: Lips and gums are healthy appearing.There is no oropharyngeal erythema or exudate. Neck:  No thyromegaly, masses, tenderness noted.    Heart:  No murmur, click, rub.  Lungs:  without wheezes, rhonchi, rubs. Abdomen: Bowel sounds are normal.  Abdomen is soft and nontender with no organomegaly, hernias, masses. GU: Deferred  Extremities:  No cyanosis, clubbing Neurologic exam: Balance, Rhomberg, finger to nose testing could not be completed due to clinical state Skin: Warm & dry w/o tenting. No significant rash.  See clinical summary under each active problem in the Problem List with associated updated therapeutic plan

## 2023-08-02 NOTE — Assessment & Plan Note (Addendum)
 She is presently afebrile with pulse rate of 94 and blood pressure 108/56.  O2 sats of 90% on 2L/min. PICC line is in place; she is at high risk for healthcare associated infection with the pancytopenia.  I shall attempt to expedite the Oncology follow-up if repeat CBC reveals progression of pancytopenia.

## 2023-08-03 ENCOUNTER — Encounter (HOSPITAL_COMMUNITY): Payer: Self-pay

## 2023-08-03 ENCOUNTER — Ambulatory Visit (HOSPITAL_COMMUNITY): Admit: 2023-08-03 | Payer: PPO | Admitting: Gastroenterology

## 2023-08-03 ENCOUNTER — Encounter (HOSPITAL_COMMUNITY): Payer: Self-pay | Admitting: Hematology

## 2023-08-03 ENCOUNTER — Other Ambulatory Visit (HOSPITAL_COMMUNITY)
Admission: RE | Admit: 2023-08-03 | Discharge: 2023-08-03 | Disposition: A | Discharge status: Home / Self Care | Source: Skilled Nursing Facility | Attending: Adult Health | Admitting: Adult Health

## 2023-08-03 ENCOUNTER — Other Ambulatory Visit: Payer: Self-pay | Admitting: Adult Health

## 2023-08-03 DIAGNOSIS — C9290 Myeloid leukemia, unspecified, not having achieved remission: Secondary | ICD-10-CM | POA: Insufficient documentation

## 2023-08-03 LAB — CBC WITH DIFFERENTIAL/PLATELET
Abs Immature Granulocytes: 0 10*3/uL (ref 0.00–0.07)
Band Neutrophils: 4 %
Basophils Absolute: 0 10*3/uL (ref 0.0–0.1)
Basophils Relative: 1 %
Eosinophils Absolute: 0 10*3/uL (ref 0.0–0.5)
Eosinophils Relative: 0 %
HCT: 31.3 % — ABNORMAL LOW (ref 36.0–46.0)
Hemoglobin: 10 g/dL — ABNORMAL LOW (ref 12.0–15.0)
Lymphocytes Relative: 62 %
Lymphs Abs: 0.9 10*3/uL (ref 0.7–4.0)
MCH: 29.9 pg (ref 26.0–34.0)
MCHC: 31.9 g/dL (ref 30.0–36.0)
MCV: 93.7 fL (ref 80.0–100.0)
Monocytes Absolute: 0 10*3/uL — ABNORMAL LOW (ref 0.1–1.0)
Monocytes Relative: 2 %
Neutro Abs: 0.5 10*3/uL — ABNORMAL LOW (ref 1.7–7.7)
Neutrophils Relative %: 31 %
Platelets: 30 10*3/uL — ABNORMAL LOW (ref 150–400)
RBC: 3.34 MIL/uL — ABNORMAL LOW (ref 3.87–5.11)
RDW: 17.2 % — ABNORMAL HIGH (ref 11.5–15.5)
Smear Review: DECREASED
WBC: 1.5 10*3/uL — ABNORMAL LOW (ref 4.0–10.5)
nRBC: 0 % (ref 0.0–0.2)
nRBC: 1 /100{WBCs} — ABNORMAL HIGH

## 2023-08-03 SURGERY — ESOPHAGOGASTRODUODENOSCOPY (EGD) WITH PROPOFOL
Anesthesia: Choice

## 2023-08-03 MED ORDER — TRAMADOL HCL 50 MG PO TABS: 50.0000 mg | ORAL_TABLET | Freq: Four times a day (QID) | ORAL | 0 refills | Status: DC | PRN

## 2023-08-03 NOTE — Assessment & Plan Note (Signed)
 Trial of Tramadol. Imaging if pain progressive.

## 2023-08-07 ENCOUNTER — Encounter (HOSPITAL_COMMUNITY): Payer: Self-pay | Admitting: Hematology

## 2023-08-10 ENCOUNTER — Other Ambulatory Visit: Payer: Self-pay | Admitting: Adult Health

## 2023-08-10 MED ORDER — ALPRAZOLAM 0.5 MG PO TABS: 0.5000 mg | ORAL_TABLET | Freq: Every day | ORAL | 0 refills | Status: DC

## 2023-08-13 ENCOUNTER — Other Ambulatory Visit: Payer: Self-pay

## 2023-08-13 ENCOUNTER — Ambulatory Visit: Payer: PPO | Admitting: Internal Medicine

## 2023-08-13 DIAGNOSIS — D61818 Other pancytopenia: Secondary | ICD-10-CM

## 2023-08-14 ENCOUNTER — Inpatient Hospital Stay: Attending: Hematology

## 2023-08-14 ENCOUNTER — Inpatient Hospital Stay: Admitting: Hematology

## 2023-08-14 VITALS — BP 105/69 | HR 89 | Temp 97.9°F | Resp 18

## 2023-08-14 DIAGNOSIS — Z79899 Other long term (current) drug therapy: Secondary | ICD-10-CM | POA: Insufficient documentation

## 2023-08-14 DIAGNOSIS — M8580 Other specified disorders of bone density and structure, unspecified site: Secondary | ICD-10-CM | POA: Diagnosis not present

## 2023-08-14 DIAGNOSIS — D61818 Other pancytopenia: Secondary | ICD-10-CM

## 2023-08-14 DIAGNOSIS — C9200 Acute myeloblastic leukemia, not having achieved remission: Secondary | ICD-10-CM | POA: Insufficient documentation

## 2023-08-14 DIAGNOSIS — Z8042 Family history of malignant neoplasm of prostate: Secondary | ICD-10-CM | POA: Diagnosis not present

## 2023-08-14 LAB — CBC WITH DIFFERENTIAL/PLATELET
Abs Immature Granulocytes: 0 10*3/uL (ref 0.00–0.07)
Band Neutrophils: 4 %
Basophils Absolute: 0 10*3/uL (ref 0.0–0.1)
Basophils Relative: 0 %
Eosinophils Absolute: 0 10*3/uL (ref 0.0–0.5)
Eosinophils Relative: 2 %
HCT: 25.3 % — ABNORMAL LOW (ref 36.0–46.0)
Hemoglobin: 8 g/dL — ABNORMAL LOW (ref 12.0–15.0)
Lymphocytes Relative: 38 %
Lymphs Abs: 0.8 10*3/uL (ref 0.7–4.0)
MCH: 29.4 pg (ref 26.0–34.0)
MCHC: 31.6 g/dL (ref 30.0–36.0)
MCV: 93 fL (ref 80.0–100.0)
Monocytes Absolute: 0.2 10*3/uL (ref 0.1–1.0)
Monocytes Relative: 11 %
Neutro Abs: 1 10*3/uL — ABNORMAL LOW (ref 1.7–7.7)
Neutrophils Relative %: 45 %
Platelets: 55 10*3/uL — ABNORMAL LOW (ref 150–400)
RBC: 2.72 MIL/uL — ABNORMAL LOW (ref 3.87–5.11)
RDW: 17.3 % — ABNORMAL HIGH (ref 11.5–15.5)
Smear Review: DECREASED
WBC: 2 10*3/uL — ABNORMAL LOW (ref 4.0–10.5)
nRBC: 1.5 % — ABNORMAL HIGH (ref 0.0–0.2)
nRBC: 2 /100{WBCs} — ABNORMAL HIGH

## 2023-08-14 LAB — SAMPLE TO BLOOD BANK

## 2023-08-14 NOTE — Progress Notes (Unsigned)
 Stephanie Wood presented for labs today Proper placement of PICC confirmed by CXR. PICC line located left arm . Good blood return present. PICC line flushed with 20ml NS and 300U/43ml Heparin. Procedure without incident. Patient tolerated procedure well.

## 2023-08-14 NOTE — Patient Instructions (Signed)
 [facility] at [facility] Discharge Instructions   You were seen and examined today by Dr. Ellin Saba.  He reviewed the results of your lab work which are normal/stable. Your hemoglobin is 8.0 and your platelets are 55.   He reviewed the results of your bone marrow biopsy results. It is not showing any mutations to target with non-aggressive treatments. 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   Dr. Kirtland Bouchard does not recommend you pursue any treatment due to your condition.   We will make a referral to hospice.   Return as scheduled.    Thank you for choosing [facility] at [facility] to provide your oncology and hematology care.  To afford each patient quality time with our provider, please arrive at least 15 minutes before your scheduled appointment time.   If you have a lab appointment with the Cancer Center please come in thru the Main Entrance and check in at the main information desk.  You need to re-schedule your appointment should you arrive 10 or more minutes late.  We strive to give you quality time with our providers, and arriving late affects you and other patients whose appointments are after yours.  Also, if you no show three or more times for appointments you may be dismissed from the clinic at the providers discretion.     Again, thank you for choosing [facility].  Our hope is that these requests will decrease the amount of time that you wait before being seen by our physicians.       _____________________________________________________________  Should you have questions after your visit to [facility], please contact our office at [phone] and follow the prompts.  Our office hours are 8:00 a.m. and 4:30 p.m. Monday - Friday.  Please note that voicemails left after 4:00 p.m. may not be returned until the following business day.  We are closed weekends and major holidays.   You do have access to a nurse 24-7, just call the main number to the clinic [phone] and do not press any options, hold on the line and a nurse will answer the phone.    For prescription refill requests, have your pharmacy contact our office and allow 72 hours.    Due to Covid, you will need to wear a mask upon entering the hospital. If you do not have a mask, a mask will be given to you at the Main Entrance upon arrival. For doctor visits, patients may have 1 support person age 24 or older with them. For treatment visits, patients can not have anyone with them due to social distancing guidelines and our immunocompromised population.

## 2023-08-14 NOTE — Progress Notes (Signed)
 [facility] [address], [place] [address]    Clinic Day:  08/14/2023  Referring physician: Billie Lade, MD  Patient Care Team: Billie Lade, MD as PCP - General (Internal Medicine) Mallipeddi, Orion Modest, MD as PCP - Cardiology (Cardiology)   ASSESSMENT & PLAN:   Assessment: Acute myeloid leukemia: -Admitted with pancytopenia and fevers on 07/23/2023 - CTAP: Normal spleen and no acute changes. - Bone marrow biopsy (07/27/2023): Acute myeloid leukemia with blast cells representing 36% of all cells.  Dysplasia in erythroid precursors. - PML/RARA t(15;17) negative.  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  Monosomy 17 may lead to loss of T p53 which is considered high risk. - Karyotype: Multiple abnormalities with 12 cells showing deletion of longer chromosome 5, del 17P consistent with very poor prognostic subgroup. - NGS myeloid panel: ASX L1, T p53 mutations present.  Negative for FLT3, NPM1, IDH 1/2. - AML FISH panel: Trisomy 8, trisomy/trisomy 21, del 5 q. and 2 q.   Plan:  1.  AML, T p53 mutation positive: - I have discussed findings on the bone marrow biopsy and additional studies with the patient and her daughter and son-in-law. - She was discharged to [facility] on 08/01/2023. - She has been receiving therapy at the [facility] for the last 1 week.  She requires assistance to stand up. - Labs today shows white count 2.0, hemoglobin 8.0, platelets improved to 55.  Differential is pending. - Recommend CBC, blood bank sample next week for possible transfusion. - Based on her functional status, I do not believe she is a candidate for AML treatment. - We have discussed best supportive care in the form of hospice.  They are agreeable.  Will make a referral.  RTC as needed.   No orders of the defined types were placed in this encounter.     Alben Deeds Teague,acting as a Neurosurgeon for Doreatha Massed, MD.,have documented all relevant  documentation on the behalf of Doreatha Massed, MD,as directed by  Doreatha Massed, MD while in the presence of Doreatha Massed, MD.   I, Doreatha Massed MD, have reviewed the above documentation for accuracy and completeness, and I agree with the above.   Doreatha Massed, MD   3/18/202511:36 AM  CHIEF COMPLAINT:   Diagnosis: Acute myeloid leukemia   Prior Therapy: None  Current Therapy: No active therapy, referral to hospice   HISTORY OF PRESENT ILLNESS:   Oncology History   No history exists.     INTERVAL HISTORY:   Stephanie Wood is a 89 y.o. female presenting to clinic today for follow up of acute myeloid leukemia. She was last seen by me on 07/31/23 as inpatient at [facility] hospital.  Since she was last seen by me, Kelie has had cytogenetics and AML NGS labs done. AML labs detected multiple abnormalities. Cytogenetics detected an abnormal karyotype complex consistent with myeloid disorders.    Today, she states that she is doing well overall. Her appetite level is at 25%. Her energy level is at 25%. Marcena is accompanied by family members. She states she is unable to go to the bathroom even though she feels the urge to go. She requires a lift when going to the restroom. Lanny currently resides in [facility], and has been able to sit in a wheelchair in the mornings. She then lies in bed the rest of the day. In daily physical therapy over the past week, Lizandra is able to do assisted stands,  and they are moving her arms and legs. She also does speech therapy to raise voice volume.   PAST MEDICAL HISTORY:   Past Medical History: Past Medical History:  Diagnosis Date   Acid reflux disease    Anemia    Anxiety    Colonic adenoma    Depression    Irritable bowel syndrome     Surgical History: Past Surgical History:  Procedure Laterality Date   CATARACT EXTRACTION     COLONOSCOPY N/A 04/15/2014   Procedure: COLONOSCOPY;  Surgeon: Malissa Hippo, MD;  Location[facility];  Service: Endoscopy;  Laterality: N/A;  730   ESOPHAGOGASTRODUODENOSCOPY  09/20/2011   Procedure: ESOPHAGOGASTRODUODENOSCOPY (EGD);  Surgeon: Malissa Hippo, MD;  Location: [facility];  Service: Endoscopy;  Laterality: N/A;  215   ESOPHAGOGASTRODUODENOSCOPY (EGD) WITH PROPOFOL N/A 03/16/2022   Procedure: ESOPHAGOGASTRODUODENOSCOPY (EGD) WITH PROPOFOL;  Surgeon: Dolores Frame, MD;  Location: [facility];  Service: Gastroenterology;  Laterality: N/A;  1230 ASA 3   EYE SURGERY     bil catatract surgery   IR BONE MARROW BIOPSY  07/27/2023    Social History: Social History   Socioeconomic History   Marital status: Widowed    Spouse name: Not on file   Number of children: 1   Years of education: Not on file   Highest education level: Not on file  Occupational History   Occupation: reitred  Tobacco Use   Smoking status: Never    Passive exposure: Never   Smokeless tobacco: Never  Vaping Use   Vaping status: Never Used  Substance and Sexual Activity   Alcohol use: No    Alcohol/week: 0.0 standard drinks of alcohol   Drug use: No   Sexual activity: Not on file  Other Topics Concern   Not on file  Social History Narrative   Not on file   Social Drivers of Health   Financial Resource Strain: Low Risk  (12/20/2022)   Overall Financial Resource Strain (CARDIA)    Difficulty of Paying Living Expenses: Not hard at all  Food Insecurity: No Food Insecurity (07/23/2023)   Hunger Vital Sign    Worried About Running Out of Food in the Last Year: Never true    Ran Out of Food in the Last Year: Never true  Transportation Needs: No Transportation Needs (07/23/2023)   PRAPARE - Administrator, Civil Service (Medical): No    Lack of Transportation (Non-Medical): No  Physical Activity: Sufficiently Active (12/20/2022)   Exercise Vital Sign    Days of Exercise per Week: 7 days    Minutes of Exercise per Session: 30 min  Stress: No Stress Concern Present  (12/20/2022)   [employer] of Occupational Health - Occupational Stress Questionnaire    Feeling of Stress : Not at all  Social Connections: Socially Integrated (07/23/2023)   Social Connection and Isolation Panel [NHANES]    Frequency of Communication with Friends and Family: More than three times a week    Frequency of Social Gatherings with Friends and Family: More than three times a week    Attends Religious Services: More than 4 times per year    Active Member of [employer] or Organizations: Yes    Attends Banker Meetings: More than 4 times per year    Marital Status: Married  Catering manager Violence: Not At Risk (07/23/2023)   Humiliation, Afraid, Rape, and Kick questionnaire    Fear of Current or Ex-Partner: No  Emotionally Abused: No    Physically Abused: No    Sexually Abused: No    Family History: Family History  Problem Relation Age of Onset   Prostate cancer Father     Current Medications:  Current Outpatient Medications:    ALPRAZolam (XANAX) 0.5 MG tablet, Take 1 tablet (0.5 mg total) by mouth at bedtime., Disp: 30 tablet, Rfl: 0   amLODipine (NORVASC) 5 MG tablet, Take 1 tablet (5 mg total) by mouth daily., Disp: 30 tablet, Rfl: 0   atorvastatin (LIPITOR) 20 MG tablet, Take 20 mg by mouth daily., Disp: , Rfl:    bifidobacterium infantis (ALIGN) capsule, Take 1 capsule by mouth daily., Disp: , Rfl:    Calcium Carbonate (CALCIUM 600 PO), Take 600 mg by mouth daily., Disp: , Rfl:    dicyclomine (BENTYL) 10 MG capsule, Take 1 capsule (10 mg total) by mouth every 12 (twelve) hours as needed for spasms (abdominal pain)., Disp: 90 capsule, Rfl: 1   docusate sodium (COLACE) 100 MG capsule, Take 1 capsule (100 mg total) by mouth daily as needed for mild constipation., Disp: 10 capsule, Rfl: 0   ELDERBERRY PO, Take 1 capsule by mouth daily., Disp: , Rfl:    fluticasone (CUTIVATE) 0.05 % cream, Apply 1 Application topically 2 (two) times daily as needed (skin  irritiation/rash)., Disp: , Rfl:    ketoconazole (NIZORAL) 2 % cream, Apply 1 Application topically 2 (two) times daily as needed for irritation., Disp: , Rfl:    loperamide (IMODIUM) 2 MG capsule, Take 2 mg by mouth as needed for diarrhea or loose stools., Disp: 30 capsule, Rfl:    loratadine (CLARITIN) 10 MG tablet, Take 1 tablet (10 mg total) by mouth daily., Disp: 90 tablet, Rfl: 3   methenamine (HIPREX) 1 g tablet, TAKE 1 TABLET BY MOUTH TWICE DAILY., Disp: 90 tablet, Rfl: 8   metoprolol tartrate (LOPRESSOR) 50 MG tablet, Take 1 tablet (50 mg total) by mouth 2 (two) times daily., Disp: 60 tablet, Rfl: 0   omeprazole (PRILOSEC) 40 MG capsule, TAKE 1 CAPSULE BY MOUTH DAILY, Disp: 90 capsule, Rfl: 3   PROLIA 60 MG/ML SOSY injection, Inject 60 mg into the skin every 6 (six) months., Disp: 1 mL, Rfl: 1   traMADol (ULTRAM) 50 MG tablet, Take 1 tablet (50 mg total) by mouth every 6 (six) hours as needed., Disp: 20 tablet, Rfl: 0   vitamin B-12 (CYANOCOBALAMIN) 1000 MCG tablet, Take 1,000 mcg by mouth daily., Disp: , Rfl:    Allergies: Allergies  Allergen Reactions   Drug Class [Trazodone And Nefazodone]    Macrobid [Nitrofurantoin] Other (See Comments)    Elevated LFT's    REVIEW OF SYSTEMS:   Review of Systems  Constitutional:  Negative for chills, fatigue and fever.  HENT:   Negative for lump/mass, mouth sores, nosebleeds, sore throat and trouble swallowing.   Eyes:  Negative for eye problems.  Respiratory:  Negative for cough and shortness of breath.   Cardiovascular:  Negative for chest pain, leg swelling and palpitations.  Gastrointestinal:  Positive for diarrhea. Negative for abdominal pain, constipation, nausea and vomiting.  Genitourinary:  Negative for bladder incontinence, difficulty urinating, dysuria, frequency, hematuria and nocturia.   Musculoskeletal:  Positive for back pain (7/10 severity). Negative for arthralgias, flank pain, myalgias and neck pain.       +bilateral foot  pain, 7/10 severity  Skin:  Negative for itching and rash.  Neurological:  Negative for dizziness, headaches and numbness.  Hematological:  Does  not bruise/bleed easily.  Psychiatric/Behavioral:  Negative for depression, sleep disturbance and suicidal ideas. The patient is not nervous/anxious.   All other systems reviewed and are negative.    VITALS:   Blood pressure 105/69, pulse 89, temperature 97.9 F (36.6 C), temperature source Tympanic, resp. rate 18, SpO2 95%.  Wt Readings from Last 3 Encounters:  08/02/23 160 lb 3.2 oz (72.7 kg)  07/26/23 156 lb 15.5 oz (71.2 kg)  07/10/23 151 lb 11.2 oz (68.8 kg)    There is no height or weight on file to calculate BMI.  Performance status (ECOG): 2 - Symptomatic, <50% confined to bed  PHYSICAL EXAM:   Physical Exam Vitals and nursing note reviewed. Exam conducted with a chaperone present.  Constitutional:      Appearance: Normal appearance.  Cardiovascular:     Rate and Rhythm: Normal rate and regular rhythm.     Pulses: Normal pulses.     Heart sounds: Normal heart sounds.  Pulmonary:     Effort: Pulmonary effort is normal.     Breath sounds: Normal breath sounds.  Abdominal:     Palpations: Abdomen is soft. There is no hepatomegaly, splenomegaly or mass.     Tenderness: There is no abdominal tenderness.  Musculoskeletal:     Right lower leg: No edema.     Left lower leg: No edema.  Lymphadenopathy:     Cervical: No cervical adenopathy.     Right cervical: No superficial, deep or posterior cervical adenopathy.    Left cervical: No superficial, deep or posterior cervical adenopathy.     Upper Body:     Right upper body: No supraclavicular or axillary adenopathy.     Left upper body: No supraclavicular or axillary adenopathy.  Neurological:     General: No focal deficit present.     Mental Status: She is alert and oriented to person, place, and time.  Psychiatric:        Mood and Affect: Mood normal.        Behavior:  Behavior normal.    LABS:   CBC     Component Value Date/Time   WBC 2.0 (L) 08/14/2023 0946   RBC 2.72 (L) 08/14/2023 0946   HGB 8.0 (L) 08/14/2023 0946   HGB 12.3 02/12/2023 1142   HCT 25.3 (L) 08/14/2023 0946   HCT 38.1 02/12/2023 1142   PLT 55 (L) 08/14/2023 0946   PLT 117 (L) 02/12/2023 1142   MCV 93.0 08/14/2023 0946   MCV 87 02/12/2023 1142   MCH 29.4 08/14/2023 0946   MCHC 31.6 08/14/2023 0946   RDW 17.3 (H) 08/14/2023 0946   RDW 15.7 (H) 02/12/2023 1142   LYMPHSABS PENDING 08/14/2023 0946   LYMPHSABS 1.8 02/12/2023 1142   MONOABS PENDING 08/14/2023 0946   EOSABS PENDING 08/14/2023 0946   EOSABS 0.1 02/12/2023 1142   BASOSABS PENDING 08/14/2023 0946   BASOSABS 0.0 02/12/2023 1142    CMP      Component Value Date/Time   NA 134 (L) 08/01/2023 0452   NA 143 05/22/2023 0926   K 4.0 08/01/2023 0452   CL 101 08/01/2023 0452   CO2 27 08/01/2023 0452   GLUCOSE 102 (H) 08/01/2023 0452   BUN 13 08/01/2023 0452   BUN 19 05/22/2023 0926   CREATININE 0.67 08/01/2023 0452   CREATININE 1.18 (H) 02/02/2021 1041   CALCIUM 6.7 (L) 08/01/2023 0452   PROT 5.1 (L) 07/30/2023 0447   PROT 6.6 02/12/2023 1142   ALBUMIN 2.4 (L) 07/30/2023 0447  ALBUMIN 4.2 02/12/2023 1142   AST 27 07/30/2023 0447   ALT 20 07/30/2023 0447   ALKPHOS 63 07/30/2023 0447   BILITOT 1.1 07/30/2023 0447   BILITOT 0.6 02/12/2023 1142   GFRNONAA >60 08/01/2023 0452   GFRAA 55 (L) 05/30/2015 0949     No results found for: "CEA1", "CEA" / No results found for: "CEA1", "CEA" No results found for: "PSA1" No results found for: "KGM010" No results found for: "CAN125"  No results found for: "TOTALPROTELP", "ALBUMINELP", "A1GS", "A2GS", "BETS", "BETA2SER", "GAMS", "MSPIKE", "SPEI" Lab Results  Component Value Date   TIBC 152 (L) 07/25/2023   TIBC 219 (L) 01/09/2021   FERRITIN 704 (H) 07/25/2023   FERRITIN 132 01/11/2021   IRONPCTSAT 13 07/25/2023   IRONPCTSAT 8 (L) 01/09/2021   Lab Results   Component Value Date   LDH 242 (H) 07/24/2023     STUDIES:   IR BONE MARROW BIOPSY Result Date: 07/27/2023 INDICATION: 89 year old with pancytopenia.  Request for bone marrow biopsy. EXAM: FLUOROSCOPIC GUIDED BONE MARROW ASPIRATES AND BIOPSY Physician: Rachelle Hora. Henn, MD MEDICATIONS: Moderate sedation ANESTHESIA/SEDATION: Moderate (conscious) sedation was employed during this procedure. A total of Versed 0.5mg  and fentanyl 25 mcg was administered intravenously at the order of the provider performing the procedure. Total intra-service moderate sedation time: 14 minutes. Patient's level of consciousness and vital signs were monitored continuously by radiology nurse throughout the procedure under the supervision of the provider performing the procedure. COMPLICATIONS: None immediate. FLUOROSCOPY: Radiation Exposure Index (as provided by the fluoroscopic device): 13.3 mGy Kerma PROCEDURE: The procedure was explained to the patient. The risks and benefits of the procedure were discussed and the patient's questions were addressed. Informed consent was obtained from the patient. The patient was placed prone on interventional table. The back was prepped and draped in sterile fashion. Maximal barrier sterile technique was utilized including caps, mask, sterile gowns, sterile gloves, sterile drape, hand hygiene and skin antiseptic. The skin and right posterior ilium were anesthetized with 1% lidocaine. OnControl bone needle was directed into the right ilium with fluoroscopic guidance. Two aspirates were obtained and one core biopsy was obtained with the powered drill. Two additional cores were performed with the 11 gauge hand bone drill using fluoroscopic guidance. Bandage placed over the puncture site. Fluoroscopic image saved for documentation. FINDINGS: Biopsy needles was directed into the posterior right ilium. Small core biopsy specimens were obtained. IMPRESSION: Fluoroscopic guided bone marrow aspiration and  core biopsy. Electronically Signed   By: Richarda Overlie M.D.   On: 07/27/2023 13:52   [place] EKG SITE RITE Result Date: 07/25/2023 If [facility] image not attached, placement could not be confirmed due to current cardiac rhythm.  Portable chest 1 View Result Date: 07/24/2023 CLINICAL DATA:  Fever. EXAM: PORTABLE CHEST 1 VIEW COMPARISON:  Radiographs 07/22/2022 and 08/23/2013. Abdominal CT 07/23/2023 FINDINGS: 0650 hours. Persistent low lung volumes. The heart size and mediastinal contours are stable with a moderate size hiatal hernia. No significant change in vascular congestion and bibasilar atelectasis from yesterday. No confluent airspace disease, pneumothorax or acute osseous abnormality identified. IMPRESSION: No significant change in vascular congestion and bibasilar atelectasis from yesterday. No evidence of pneumonia. Electronically Signed   By: Carey Bullocks M.D.   On: 07/24/2023 10:54   DG Chest Portable 1 View Result Date: 07/23/2023 CLINICAL DATA:  Cough.  Dizziness with fall. EXAM: PORTABLE CHEST 1 VIEW COMPARISON:  Radiographs 08/23/2013 and 07/28/2013. Abdominal CT 07/23/2023. FINDINGS: 1153 hours. Low lung volumes. The heart size  and mediastinal contours are stable with a moderate size hiatal hernia. There is vascular congestion with mildly increased atelectasis at both lung bases. No confluent airspace disease, pleural effusion or pneumothorax. No acute fractures are demonstrated. IMPRESSION: Low lung volumes with vascular congestion and mildly increased atelectasis at both lung bases. No evidence of pneumonia or pulmonary edema. Electronically Signed   By: Carey Bullocks M.D.   On: 07/23/2023 13:44   CT ABDOMEN PELVIS W CONTRAST Result Date: 07/23/2023 CLINICAL DATA:  89 year old female status post fall. Dizziness. Abdominal pain. EXAM: CT ABDOMEN AND PELVIS WITH CONTRAST TECHNIQUE: Multidetector CT imaging of the abdomen and pelvis was performed using the standard protocol following bolus  administration of intravenous contrast. RADIATION DOSE REDUCTION: This exam was performed according to the departmental dose-optimization program which includes automated exposure control, adjustment of the mA and/or kV according to patient size and/or use of iterative reconstruction technique. CONTRAST:  OMNIPAQUE IOHEXOL 300 MG/ML  SOLN COMPARISON:  CT Abdomen and Pelvis 01/10/2022. FINDINGS: Lower chest: Moderate to large gastric hiatal hernia does not appear significantly changed since 2023. Superimposed mild cardiomegaly is stable. No pericardial effusion. Chronic increased AP dimension to the lungs. Trace new pleural effusions. Patchy, streaky new dependent lung opacity. Hepatobiliary: Negative liver and gallbladder. Pancreas: Partially atrophied. Spleen: Negative. Adrenals/Urinary Tract: Stable since 2023 and negative. Symmetric renal enhancement and early contrast excretion. Benign left renal cysts (no follow-up imaging recommended). Stomach/Bowel: Fluid in the right colon and transverse colon, but decompressed distal transverse through rectum colonic segments. No large bowel wall thickening identified. Appendix remains normal on coronal image 68. Decompressed terminal ileum and no dilated small bowel. Chronic partially intrathoracic stomach. Second portion duodenum chronic diverticulum with no active inflammation. No free air, free fluid, or mesenteric inflammation identified. Vascular/Lymphatic: Mild for age Aortoiliac calcified atherosclerosis. Normal caliber abdominal aorta. Major arterial structures and portal venous system appear to be patent. No lymphadenopathy. Reproductive: Negative. Other: No pelvis free fluid. Small fat containing right inguinal hernia is chronic and stable. Musculoskeletal: No displaced lower rib fracture is identified. Chronic L1 compression fracture is moderate to severe but stable since 2023. Stable visible vertebral height otherwise. Chronic osteopenia. No acute osseous  abnormality identified. IMPRESSION: 1. Trace new pleural effusions and patchy dependent lung opacity which more resembles atelectasis than infection. Superimposed chronic moderate to large gastric hiatal hernia. 2. No acute traumatic injury or acute/inflammatory process identified in the abdomen or pelvis. Normal appendix. Chronic L1 compression fracture. 3.  Aortic Atherosclerosis (ICD10-I70.0). Electronically Signed   By: Odessa Fleming M.D.   On: 07/23/2023 10:30   CT Head Wo Contrast Result Date: 07/23/2023 CLINICAL DATA:  Provided history: Syncope/presyncope, cerebrovascular cause suspected. EXAM: CT HEAD WITHOUT CONTRAST TECHNIQUE: Contiguous axial images were obtained from the base of the skull through the vertex without intravenous contrast. RADIATION DOSE REDUCTION: This exam was performed according to the departmental dose-optimization program which includes automated exposure control, adjustment of the mA and/or kV according to patient size and/or use of iterative reconstruction technique. COMPARISON:  None. FINDINGS: Brain: Generalized cerebral and cerebellar atrophy. Patchy and ill-defined hypoattenuation within the cerebral white matter, nonspecific but compatible with mild chronic small vessel ischemic disease. There is no acute intracranial hemorrhage. No demarcated cortical infarct. No extra-axial fluid collection. No evidence of an intracranial mass. No midline shift. Vascular: No hyperdense vessel. Atherosclerotic calcifications. Skull: No calvarial fracture or aggressive osseous lesion. Sinuses/Orbits: No mass or acute finding within the imaged orbits. Minimal mucosal thickening within the bilateral  ethmoid and right sphenoid sinuses. IMPRESSION: 1. No evidence of an acute intracranial abnormality. 2. Mild chronic small vessel ischemic changes within the cerebral white matter. 3. Generalized parenchymal atrophy. Electronically Signed   By: Jackey Loge D.O.   On: 07/23/2023 10:13

## 2023-08-17 ENCOUNTER — Encounter: Payer: Self-pay | Admitting: Adult Health

## 2023-08-17 ENCOUNTER — Non-Acute Institutional Stay (SKILLED_NURSING_FACILITY): Payer: Self-pay | Admitting: Adult Health

## 2023-08-17 DIAGNOSIS — C9200 Acute myeloblastic leukemia, not having achieved remission: Secondary | ICD-10-CM

## 2023-08-17 DIAGNOSIS — I5032 Chronic diastolic (congestive) heart failure: Secondary | ICD-10-CM | POA: Diagnosis not present

## 2023-08-17 DIAGNOSIS — S32000G Wedge compression fracture of unspecified lumbar vertebra, subsequent encounter for fracture with delayed healing: Secondary | ICD-10-CM

## 2023-08-17 NOTE — Progress Notes (Signed)
 Location:  [facility] Room Number: 102 Place of Service:  SNF (31)   CODE STATUS: dnr  Allergies  Allergen Reactions   Drug Class [Trazodone And Nefazodone]    Macrobid [Nitrofurantoin] Other (See Comments)    Elevated LFT's    Chief Complaint  Patient presents with   Acute Visit    Care plan meeting     HPI:  We have come together for her care plan meeting. Family present BIMS 10/15 mood 8/30: not eating; decreased energy; nervous, depression. She is spending most of her time in bed for comfort with one fall without injury. She requires dependent assist with her adl care. She is frequently incontinent of bladder and bowel. Dietary: regular diet: requires assist with meals; weight is 143.4 pounds; minimal po intake. Therapy: none at this time. She is being followed by hospice care. She will continue to be followed for her chronic illnesses including: Acute myeloid leukemia not having achieved remission   Compression of lumbar vertebrae with delayed healing unspecified vertebral level subsequent encounter Chronic diastolic heart failure  Past Medical History:  Diagnosis Date   Acid reflux disease    Anemia    Anxiety    Colonic adenoma    Depression    Irritable bowel syndrome     Past Surgical History:  Procedure Laterality Date   CATARACT EXTRACTION     COLONOSCOPY N/A 04/15/2014   Procedure: COLONOSCOPY;  Surgeon: Malissa Hippo, MD;  Location: [facility];  Service: Endoscopy;  Laterality: N/A;  730   ESOPHAGOGASTRODUODENOSCOPY  09/20/2011   Procedure: ESOPHAGOGASTRODUODENOSCOPY (EGD);  Surgeon: Malissa Hippo, MD;  Location: [facility];  Service: Endoscopy;  Laterality: N/A;  215   ESOPHAGOGASTRODUODENOSCOPY (EGD) WITH PROPOFOL N/A 03/16/2022   Procedure: ESOPHAGOGASTRODUODENOSCOPY (EGD) WITH PROPOFOL;  Surgeon: Dolores Frame, MD;  Location: [facility];  Service: Gastroenterology;  Laterality: N/A;  1230 ASA 3   EYE SURGERY      bil catatract surgery   IR BONE MARROW BIOPSY  07/27/2023    Social History   Socioeconomic History   Marital status: Widowed    Spouse name: Not on file   Number of children: 1   Years of education: Not on file   Highest education level: Not on file  Occupational History   Occupation: reitred  Tobacco Use   Smoking status: Never    Passive exposure: Never   Smokeless tobacco: Never  Vaping Use   Vaping status: Never Used  Substance and Sexual Activity   Alcohol use: No    Alcohol/week: 0.0 standard drinks of alcohol   Drug use: No   Sexual activity: Not on file  Other Topics Concern   Not on file  Social History Narrative   Not on file   Social Drivers of Health   Financial Resource Strain: Low Risk  (12/20/2022)   Overall Financial Resource Strain (CARDIA)    Difficulty of Paying Living Expenses: Not hard at all  Food Insecurity: No Food Insecurity (07/23/2023)   Hunger Vital Sign    Worried About Running Out of Food in the Last Year: Never true    Ran Out of Food in the Last Year: Never true  Transportation Needs: No Transportation Needs (07/23/2023)   PRAPARE - Administrator, Civil Service (Medical): No    Lack of Transportation (Non-Medical): No  Physical Activity: Sufficiently Active (12/20/2022)   Exercise Vital Sign    Days of Exercise per  Week: 7 days    Minutes of Exercise per Session: 30 min  Stress: No Stress Concern Present (12/20/2022)   [employer] of Occupational Health - Occupational Stress Questionnaire    Feeling of Stress : Not at all  Social Connections: Socially Integrated (07/23/2023)   Social Connection and Isolation Panel [NHANES]    Frequency of Communication with Friends and Family: More than three times a week    Frequency of Social Gatherings with Friends and Family: More than three times a week    Attends Religious Services: More than 4 times per year    Active Member of [employer] or Organizations: Yes    Attends Probation officer: More than 4 times per year    Marital Status: Married  Catering manager Violence: Not At Risk (07/23/2023)   Humiliation, Afraid, Rape, and Kick questionnaire    Fear of Current or Ex-Partner: No    Emotionally Abused: No    Physically Abused: No    Sexually Abused: No   Family History  Problem Relation Age of Onset   Prostate cancer Father       VITAL SIGNS BP 132/68   Pulse 72   Temp 98.1 F (36.7 C)   Resp (!) 22   Ht 5\' 4"  (1.626 m)   Wt 143 lb 6.4 oz (65 kg)   SpO2 97%   BMI 24.61 kg/m   Outpatient Encounter Medications as of 08/17/2023  Medication Sig Note   LORazepam (ATIVAN) 2 MG/ML concentrated solution Take 0.5 mg by mouth every 4 (four) hours as needed for anxiety.    Morphine Sulfate (MORPHINE CONCENTRATE) 10 mg / 0.5 ml concentrated solution Take 5 mg by mouth every 2 (two) hours as needed for severe pain (pain score 7-10), shortness of breath or anxiety.    [DISCONTINUED] ALPRAZolam (XANAX) 0.5 MG tablet Take 1 tablet (0.5 mg total) by mouth at bedtime.    [DISCONTINUED] amLODipine (NORVASC) 5 MG tablet Take 1 tablet (5 mg total) by mouth daily.    [DISCONTINUED] atorvastatin (LIPITOR) 20 MG tablet Take 20 mg by mouth daily.    [DISCONTINUED] bifidobacterium infantis (ALIGN) capsule Take 1 capsule by mouth daily.    [DISCONTINUED] Calcium Carbonate (CALCIUM 600 PO) Take 600 mg by mouth daily.    [DISCONTINUED] dicyclomine (BENTYL) 10 MG capsule Take 1 capsule (10 mg total) by mouth every 12 (twelve) hours as needed for spasms (abdominal pain).    [DISCONTINUED] docusate sodium (COLACE) 100 MG capsule Take 1 capsule (100 mg total) by mouth daily as needed for mild constipation.    [DISCONTINUED] ELDERBERRY PO Take 1 capsule by mouth daily.    [DISCONTINUED] fluticasone (CUTIVATE) 0.05 % cream Apply 1 Application topically 2 (two) times daily as needed (skin irritiation/rash).    [DISCONTINUED] ketoconazole (NIZORAL) 2 % cream Apply 1  Application topically 2 (two) times daily as needed for irritation.    [DISCONTINUED] loperamide (IMODIUM) 2 MG capsule Take 2 mg by mouth as needed for diarrhea or loose stools.    [DISCONTINUED] loratadine (CLARITIN) 10 MG tablet Take 1 tablet (10 mg total) by mouth daily.    [DISCONTINUED] methenamine (HIPREX) 1 g tablet TAKE 1 TABLET BY MOUTH TWICE DAILY.    [DISCONTINUED] metoprolol tartrate (LOPRESSOR) 50 MG tablet Take 1 tablet (50 mg total) by mouth 2 (two) times daily.    [DISCONTINUED] omeprazole (PRILOSEC) 40 MG capsule TAKE 1 CAPSULE BY MOUTH DAILY    [DISCONTINUED] PROLIA 60 MG/ML SOSY injection Inject 60 mg  into the skin every 6 (six) months. 07/23/2023: Last dose: December 2024   [DISCONTINUED] traMADol (ULTRAM) 50 MG tablet Take 1 tablet (50 mg total) by mouth every 6 (six) hours as needed.    [DISCONTINUED] vitamin B-12 (CYANOCOBALAMIN) 1000 MCG tablet Take 1,000 mcg by mouth daily.    No facility-administered encounter medications on file as of 08/17/2023.     SIGNIFICANT DIAGNOSTIC EXAMS  Review of Systems  Constitutional:  Negative for malaise/fatigue.  Respiratory:  Negative for cough and shortness of breath.   Cardiovascular:  Negative for chest pain, palpitations and leg swelling.  Gastrointestinal:  Negative for abdominal pain, constipation and heartburn.  Musculoskeletal:  Positive for back pain, joint pain and myalgias.  Skin: Negative.   Neurological:  Negative for dizziness.  Psychiatric/Behavioral:  The patient is not nervous/anxious.    Physical Exam Constitutional:      General: She is not in acute distress.    Appearance: She is not diaphoretic.  Neck:     Thyroid: No thyromegaly.  Cardiovascular:     Rate and Rhythm: Normal rate and regular rhythm.     Heart sounds: Normal heart sounds.  Pulmonary:     Effort: Pulmonary effort is normal. No respiratory distress.     Breath sounds: Normal breath sounds.  Abdominal:     General: Bowel sounds are  normal. There is no distension.     Palpations: Abdomen is soft.     Tenderness: There is no abdominal tenderness.  Musculoskeletal:        General: Normal range of motion.     Cervical back: Neck supple.     Right lower leg: No edema.     Left lower leg: No edema.  Lymphadenopathy:     Cervical: No cervical adenopathy.  Skin:    General: Skin is warm and dry.  Neurological:     Mental Status: She is alert. Mental status is at baseline.  Psychiatric:        Mood and Affect: Mood normal.     ASSESSMENT/ PLAN:  TODAY  Acute myeloid leukemia not having achieved remission  Compression of lumbar vertebrae with delayed healing unspecified vertebral level subsequent encounter Chronic diastolic heart failure  Will continue with hospice care The focus of her care is comfort only Will continue to monitor her status.   Time spent with patient: 40 minutes: medications; hospice care; dietary    Synthia Innocent NP [facility]   call [phone]

## 2023-08-21 ENCOUNTER — Inpatient Hospital Stay

## 2023-08-28 DEATH — deceased

## 2023-09-24 ENCOUNTER — Ambulatory Visit (INDEPENDENT_AMBULATORY_CARE_PROVIDER_SITE_OTHER): Payer: PPO | Admitting: Gastroenterology

## 2023-12-25 ENCOUNTER — Ambulatory Visit: Payer: PPO
